# Patient Record
Sex: Male | Born: 1950
Health system: Southern US, Community
[De-identification: ages and names within clinical notes are randomized; demographics above are authoritative.]

## PROBLEM LIST (undated history)

## (undated) DIAGNOSIS — C44300 Unspecified malignant neoplasm of skin of unspecified part of face: Secondary | ICD-10-CM

## (undated) DIAGNOSIS — R000 Tachycardia, unspecified: Secondary | ICD-10-CM

## (undated) DIAGNOSIS — C801 Malignant (primary) neoplasm, unspecified: Secondary | ICD-10-CM

## (undated) DIAGNOSIS — R7611 Nonspecific reaction to tuberculin skin test without active tuberculosis: Secondary | ICD-10-CM

## (undated) DIAGNOSIS — Z85828 Personal history of other malignant neoplasm of skin: Secondary | ICD-10-CM

## (undated) DIAGNOSIS — A159 Respiratory tuberculosis unspecified: Secondary | ICD-10-CM

## (undated) DIAGNOSIS — M1990 Unspecified osteoarthritis, unspecified site: Secondary | ICD-10-CM

## (undated) DIAGNOSIS — N289 Disorder of kidney and ureter, unspecified: Secondary | ICD-10-CM

## (undated) DIAGNOSIS — R51 Headache: Secondary | ICD-10-CM

## (undated) DIAGNOSIS — E785 Hyperlipidemia, unspecified: Secondary | ICD-10-CM

## (undated) DIAGNOSIS — E039 Hypothyroidism, unspecified: Secondary | ICD-10-CM

## (undated) DIAGNOSIS — R011 Cardiac murmur, unspecified: Secondary | ICD-10-CM

## (undated) DIAGNOSIS — Z87442 Personal history of urinary calculi: Secondary | ICD-10-CM

## (undated) DIAGNOSIS — L02619 Cutaneous abscess of unspecified foot: Secondary | ICD-10-CM

## (undated) DIAGNOSIS — B0229 Other postherpetic nervous system involvement: Secondary | ICD-10-CM

## (undated) DIAGNOSIS — I10 Essential (primary) hypertension: Secondary | ICD-10-CM

## (undated) DIAGNOSIS — E079 Disorder of thyroid, unspecified: Secondary | ICD-10-CM

## (undated) DIAGNOSIS — N529 Male erectile dysfunction, unspecified: Secondary | ICD-10-CM

## (undated) DIAGNOSIS — I00 Rheumatic fever without heart involvement: Secondary | ICD-10-CM

## (undated) DIAGNOSIS — R1013 Epigastric pain: Secondary | ICD-10-CM

## (undated) DIAGNOSIS — S99929A Unspecified injury of unspecified foot, initial encounter: Secondary | ICD-10-CM

## (undated) DIAGNOSIS — N200 Calculus of kidney: Secondary | ICD-10-CM

## (undated) DIAGNOSIS — S99919A Unspecified injury of unspecified ankle, initial encounter: Secondary | ICD-10-CM

## (undated) DIAGNOSIS — R7989 Other specified abnormal findings of blood chemistry: Secondary | ICD-10-CM

## (undated) DIAGNOSIS — E291 Testicular hypofunction: Secondary | ICD-10-CM

## (undated) DIAGNOSIS — S8990XA Unspecified injury of unspecified lower leg, initial encounter: Secondary | ICD-10-CM

## (undated) DIAGNOSIS — K3189 Other diseases of stomach and duodenum: Secondary | ICD-10-CM

## (undated) DIAGNOSIS — J069 Acute upper respiratory infection, unspecified: Secondary | ICD-10-CM

## (undated) DIAGNOSIS — L03119 Cellulitis of unspecified part of limb: Secondary | ICD-10-CM

## (undated) DIAGNOSIS — K5900 Constipation, unspecified: Secondary | ICD-10-CM

## (undated) DIAGNOSIS — E8881 Metabolic syndrome: Secondary | ICD-10-CM

## (undated) HISTORY — DX: Disorder of thyroid, unspecified: E07.9

## (undated) HISTORY — DX: Cardiac murmur, unspecified: R01.1

## (undated) HISTORY — DX: Hyperlipidemia, unspecified: E78.5

## (undated) HISTORY — DX: Cellulitis of unspecified part of limb: L03.119

## (undated) HISTORY — DX: Male erectile dysfunction, unspecified: N52.9

## (undated) HISTORY — DX: Metabolic syndrome: E88.81

## (undated) HISTORY — DX: Other postherpetic nervous system involvement: B02.29

## (undated) HISTORY — PX: TONSILLECTOMY AND ADENOIDECTOMY: SHX28

## (undated) HISTORY — DX: Personal history of urinary calculi: Z87.442

## (undated) HISTORY — DX: Unspecified injury of unspecified foot, initial encounter: S99.929A

## (undated) HISTORY — DX: Rheumatic fever without heart involvement: I00

## (undated) HISTORY — DX: Malignant (primary) neoplasm, unspecified: C80.1

## (undated) HISTORY — DX: Unspecified osteoarthritis, unspecified site: M19.90

## (undated) HISTORY — DX: Other specified abnormal findings of blood chemistry: R79.89

## (undated) HISTORY — DX: Nonspecific reaction to tuberculin skin test without active tuberculosis: R76.11

## (undated) HISTORY — DX: Testicular hypofunction: E29.1

## (undated) HISTORY — DX: Epigastric pain: R10.13

## (undated) HISTORY — DX: Calculus of kidney: N20.0

## (undated) HISTORY — DX: Unspecified injury of unspecified lower leg, initial encounter: S89.90XA

## (undated) HISTORY — DX: Constipation, unspecified: K59.00

## (undated) HISTORY — DX: Other diseases of stomach and duodenum: K31.89

## (undated) HISTORY — DX: Unspecified injury of unspecified ankle, initial encounter: S99.919A

## (undated) HISTORY — PX: KNEE SURGERY: SHX244

## (undated) HISTORY — DX: Personal history of other malignant neoplasm of skin: Z85.828

## (undated) HISTORY — DX: Unspecified malignant neoplasm of skin of unspecified part of face: C44.300

## (undated) HISTORY — DX: Headache: R51

## (undated) HISTORY — DX: Disorder of kidney and ureter, unspecified: N28.9

## (undated) HISTORY — DX: Acute upper respiratory infection, unspecified: J06.9

## (undated) HISTORY — DX: Cutaneous abscess of unspecified foot: L02.619

## (undated) HISTORY — DX: Tachycardia, unspecified: R00.0

---

## 2005-10-29 ENCOUNTER — Emergency Department (HOSPITAL_COMMUNITY): Admission: EM | Admit: 2005-10-29 | Discharge: 2005-10-29 | Payer: Self-pay | Admitting: Emergency Medicine

## 2006-09-23 ENCOUNTER — Ambulatory Visit: Payer: Self-pay | Admitting: Cardiology

## 2006-09-23 ENCOUNTER — Observation Stay (HOSPITAL_COMMUNITY): Admission: EM | Admit: 2006-09-23 | Discharge: 2006-09-25 | Payer: Self-pay | Admitting: Emergency Medicine

## 2008-10-27 ENCOUNTER — Ambulatory Visit: Payer: Self-pay | Admitting: Internal Medicine

## 2008-10-27 DIAGNOSIS — N529 Male erectile dysfunction, unspecified: Secondary | ICD-10-CM

## 2008-10-27 DIAGNOSIS — Z85828 Personal history of other malignant neoplasm of skin: Secondary | ICD-10-CM

## 2008-10-27 DIAGNOSIS — E785 Hyperlipidemia, unspecified: Secondary | ICD-10-CM

## 2008-10-27 DIAGNOSIS — R1013 Epigastric pain: Secondary | ICD-10-CM

## 2008-10-27 DIAGNOSIS — N200 Calculus of kidney: Secondary | ICD-10-CM | POA: Insufficient documentation

## 2008-10-27 DIAGNOSIS — C44300 Unspecified malignant neoplasm of skin of unspecified part of face: Secondary | ICD-10-CM

## 2008-10-27 DIAGNOSIS — C44309 Unspecified malignant neoplasm of skin of other parts of face: Secondary | ICD-10-CM | POA: Insufficient documentation

## 2008-10-27 DIAGNOSIS — Z87442 Personal history of urinary calculi: Secondary | ICD-10-CM

## 2008-10-27 DIAGNOSIS — M1990 Unspecified osteoarthritis, unspecified site: Secondary | ICD-10-CM | POA: Insufficient documentation

## 2008-10-27 DIAGNOSIS — K3189 Other diseases of stomach and duodenum: Secondary | ICD-10-CM | POA: Insufficient documentation

## 2008-10-27 HISTORY — DX: Hyperlipidemia, unspecified: E78.5

## 2008-10-27 HISTORY — DX: Unspecified malignant neoplasm of skin of unspecified part of face: C44.300

## 2008-10-27 HISTORY — DX: Epigastric pain: R10.13

## 2008-10-27 HISTORY — DX: Male erectile dysfunction, unspecified: N52.9

## 2008-10-27 HISTORY — DX: Personal history of other malignant neoplasm of skin: Z85.828

## 2008-10-27 HISTORY — DX: Other diseases of stomach and duodenum: K31.89

## 2008-10-27 HISTORY — DX: Unspecified osteoarthritis, unspecified site: M19.90

## 2008-10-27 HISTORY — DX: Personal history of urinary calculi: Z87.442

## 2008-10-27 LAB — CONVERTED CEMR LAB
ALT: 24 units/L (ref 0–53)
AST: 23 units/L (ref 0–37)
Albumin: 4.2 g/dL (ref 3.5–5.2)
Alkaline Phosphatase: 88 units/L (ref 39–117)
Amylase: 66 units/L (ref 27–131)
BUN: 19 mg/dL (ref 6–23)
Basophils Relative: 0.3 % (ref 0.0–3.0)
Bilirubin, Direct: 0 mg/dL (ref 0.0–0.3)
CO2: 26 meq/L (ref 19–32)
Calcium: 9.4 mg/dL (ref 8.4–10.5)
Chloride: 107 meq/L (ref 96–112)
Cholesterol: 247 mg/dL — ABNORMAL HIGH (ref 0–200)
Creatinine, Ser: 1.4 mg/dL (ref 0.4–1.5)
Direct LDL: 160.6 mg/dL
Eosinophils Relative: 2.1 % (ref 0.0–5.0)
GFR calc non Af Amer: 55.3 mL/min (ref >60)
Glucose, Bld: 80 mg/dL (ref 70–99)
HCT: 46.7 % (ref 39.0–52.0)
HDL: 30.7 mg/dL — ABNORMAL LOW (ref >39.00)
Hemoglobin, Urine: NEGATIVE
Hemoglobin: 15.7 g/dL (ref 13.0–17.0)
Ketones, ur: NEGATIVE mg/dL
Leukocytes, UA: NEGATIVE
Lipase: 27 units/L (ref 11.0–59.0)
Lymphocytes Relative: 26.7 % (ref 12.0–46.0)
MCHC: 33.6 g/dL (ref 30.0–36.0)
MCV: 88.1 fL (ref 78.0–100.0)
Monocytes Relative: 3.1 % (ref 3.0–12.0)
Neutrophils Relative %: 67.8 % (ref 43.0–77.0)
Nitrite: NEGATIVE
PSA: 1.95 ng/mL (ref 0.10–4.00)
Platelets: 214 10*3/uL (ref 150.0–400.0)
Potassium: 4.2 meq/L (ref 3.5–5.1)
RBC: 5.3 M/uL (ref 4.22–5.81)
RDW: 13.4 % (ref 11.5–14.6)
Sodium: 144 meq/L (ref 135–145)
Specific Gravity, Urine: >=1.03 (ref 1.000–1.030)
TSH: 3.86 microintl units/mL (ref 0.35–5.50)
Testosterone: 98.81 ng/dL — ABNORMAL LOW (ref 350.00–890.00)
Total Bilirubin: 0.6 mg/dL (ref 0.3–1.2)
Total CHOL/HDL Ratio: 8
Total Protein: 7 g/dL (ref 6.0–8.3)
Triglycerides: 277 mg/dL — ABNORMAL HIGH (ref 0.0–149.0)
Urine Glucose: NEGATIVE mg/dL
Urobilinogen, UA: 1 (ref 0.0–1.0)
VLDL: 55.4 mg/dL — ABNORMAL HIGH (ref 0.0–40.0)
WBC: 7.8 10*3/uL (ref 4.5–10.5)
pH: 5.5 (ref 5.0–8.0)

## 2008-10-29 ENCOUNTER — Encounter: Payer: Self-pay | Admitting: Internal Medicine

## 2008-11-26 ENCOUNTER — Ambulatory Visit: Payer: Self-pay | Admitting: Internal Medicine

## 2008-11-26 DIAGNOSIS — E291 Testicular hypofunction: Secondary | ICD-10-CM

## 2008-11-26 HISTORY — DX: Testicular hypofunction: E29.1

## 2009-02-05 ENCOUNTER — Ambulatory Visit: Payer: Self-pay | Admitting: Internal Medicine

## 2009-02-05 DIAGNOSIS — E8881 Metabolic syndrome: Secondary | ICD-10-CM | POA: Insufficient documentation

## 2009-02-05 DIAGNOSIS — J069 Acute upper respiratory infection, unspecified: Secondary | ICD-10-CM

## 2009-02-05 HISTORY — DX: Acute upper respiratory infection, unspecified: J06.9

## 2009-02-05 HISTORY — DX: Metabolic syndrome: E88.81

## 2009-02-05 HISTORY — DX: Metabolic syndrome: E88.810

## 2009-02-05 LAB — CONVERTED CEMR LAB
Cholesterol, target level: 200 mg/dL
HDL goal, serum: 40 mg/dL
LDL Goal: 130 mg/dL

## 2009-09-22 ENCOUNTER — Ambulatory Visit: Payer: Self-pay | Admitting: Internal Medicine

## 2009-09-22 ENCOUNTER — Telehealth: Payer: Self-pay | Admitting: Internal Medicine

## 2009-09-22 DIAGNOSIS — S99919A Unspecified injury of unspecified ankle, initial encounter: Secondary | ICD-10-CM

## 2009-09-22 DIAGNOSIS — L03119 Cellulitis of unspecified part of limb: Secondary | ICD-10-CM

## 2009-09-22 DIAGNOSIS — S99929A Unspecified injury of unspecified foot, initial encounter: Secondary | ICD-10-CM

## 2009-09-22 DIAGNOSIS — L02619 Cutaneous abscess of unspecified foot: Secondary | ICD-10-CM

## 2009-09-22 DIAGNOSIS — S8990XA Unspecified injury of unspecified lower leg, initial encounter: Secondary | ICD-10-CM | POA: Insufficient documentation

## 2009-09-22 HISTORY — DX: Unspecified injury of unspecified lower leg, initial encounter: S89.90XA

## 2009-09-22 HISTORY — DX: Cellulitis of unspecified part of limb: L02.619

## 2009-12-14 ENCOUNTER — Encounter (INDEPENDENT_AMBULATORY_CARE_PROVIDER_SITE_OTHER): Payer: Self-pay | Admitting: Emergency Medicine

## 2009-12-14 ENCOUNTER — Emergency Department (HOSPITAL_COMMUNITY): Admission: EM | Admit: 2009-12-14 | Discharge: 2009-12-14 | Payer: Self-pay | Admitting: Emergency Medicine

## 2009-12-14 ENCOUNTER — Ambulatory Visit: Payer: Self-pay | Admitting: Internal Medicine

## 2010-02-16 NOTE — Assessment & Plan Note (Signed)
Summary: ankle injury/SD   Vital Signs:  Patient profile:   60 year old male Height:      72 inches Weight:      230 pounds BMI:     31.31 O2 Sat:      96 % on Room air Temp:     97.3 degrees F oral Pulse rate:   76 / minute Pulse rhythm:   regular Resp:     16 per minute BP sitting:   124 / 86  (left arm) Cuff size:   large  Vitals Entered By: Rock Nephew CMA (September 22, 2009 10:27 AM)  O2 Flow:  Room air   Primary Care Provider:  Etta Grandchild MD   History of Present Illness: He returns with the complaint that he hit the right ankle 3 days ago with a pick ax and has a PW. The area bled at first but now it feels sore and mildly swollen.  Preventive Screening-Counseling & Management  Alcohol-Tobacco     Alcohol drinks/day: <1     Alcohol type: beer     >5/day in last 3 mos: no     Alcohol Counseling: not indicated; use of alcohol is not excessive or problematic     Feels need to cut down: no     Feels annoyed by complaints: no     Feels guilty re: drinking: no     Needs 'eye opener' in am: no     Smoking Status: never     Tobacco Counseling: not indicated; no tobacco use  Hep-HIV-STD-Contraception     Hepatitis Risk: no risk noted     HIV Risk: no risk noted     STD Risk: no risk noted      Drug Use:  no.    Clinical Review Panels:  Immunizations   Last Tetanus Booster:  Historical (01/17/2001)   Medications Prior to Update: 1)  Crestor 20 Mg Tabs (Rosuvastatin Calcium) .... Once Daily For Cholesterol 2)  Androgel 50 Mg/5gm Gel (Testosterone) .... Apply Two  Once Daily 3)  Viagra 50 Mg Tabs (Sildenafil Citrate) .Marland Kitchen.. 1-2 As Directed  Current Medications (verified): 1)  Crestor 20 Mg Tabs (Rosuvastatin Calcium) .... Once Daily For Cholesterol 2)  Androgel 50 Mg/5gm Gel (Testosterone) .... Apply Two  Once Daily 3)  Viagra 50 Mg Tabs (Sildenafil Citrate) .Marland Kitchen.. 1-2 As Directed 4)  Cipro 500 Mg Tab (Ciprofloxacin Hcl) .... Take 1 Tablet By Mouth  Morning and Night X 10 Days  Allergies (verified): 1)  ! 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 Family History of Alcoholism/Addiction Family History of Arthritis Family History Diabetes 1st degree relative  Social History: Reviewed history from 10/27/2008 and no changes required. 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   General:  Denies chills, fatigue, fever, loss of appetite, malaise, sleep disorder, and sweats. MS:  Complains of joint pain, joint redness, joint swelling, and stiffness; denies loss of strength, low back pain, muscle aches, and cramps.  Physical Exam  General:  alert, well-developed, well-nourished, well-hydrated, normal appearance, healthy-appearing, cooperative to examination, good hygiene, and overweight-appearing.   Mouth:  no exudates, no posterior lymphoid hypertrophy, no pharyngeal crowing, no lesions, no erosions, no leukoplakia, no petechiae, and pharyngeal erythema.     Neck:  supple, full ROM, no masses, no thyromegaly, no thyroid nodules or tenderness, no JVD, no cervical lymphadenopathy, and no neck tenderness.   Lungs:  normal respiratory effort, no intercostal retractions, no accessory muscle use, normal breath sounds, no dullness, no fremitus, no crackles, and no wheezes.   Heart:  normal rate, regular rhythm, no murmur, no gallop, no rub, and no JVD.   Abdomen:  soft, non-tender, normal bowel sounds, no distention, no masses, no guarding, no rigidity, no hepatomegaly, and no splenomegaly.   Msk:  over the right ankle there is a small healed PW just above the medial malleolus that produces no exudate when pressed, there is a mild surrounding area of erythema and warmth. no FB or boney derangement is seen. there is FROM and no joint laxity and he has normal flexion and extension in the foot. Pulses:  R femoral normal, R popliteal normal, R posterior tibial normal, and R dorsalis pedis normal.   Extremities:  no edema Neurologic:  No cranial nerve deficits noted. Station and gait are normal. Plantar reflexes are down-going bilaterally. DTRs are symmetrical throughout. Sensory, motor and coordinative functions appear intact. Cervical Nodes:  no anterior cervical adenopathy and no posterior cervical adenopathy.   Inguinal Nodes:  no R inguinal adenopathy and no L inguinal adenopathy.   Psych:  Cognition and judgment appear intact. Alert and cooperative with normal attention span and concentration. No apparent delusions, illusions, hallucinations   Impression & Recommendations:  Problem # 1:  ANKLE INJURY, RIGHT (ICD-959.7) the xray is normal, there is no fracture or FB, this is a PW with mild early cellulitis- he did not want a tetanus booster today  Problem # 2:  CELLULITIS, FOOT (ICD-682.7) Assessment: New  His updated medication list for this problem includes:    Cipro 500 Mg Tab (Ciprofloxacin hcl) .Marland Kitchen... Take 1 tablet by mouth morning and night x 10  days  Complete Medication List: 1)  Crestor 20 Mg Tabs (Rosuvastatin calcium) .... Once daily for cholesterol 2)  Androgel 50 Mg/5gm Gel (Testosterone) .... Apply two  once daily 3)  Viagra 50 Mg Tabs (Sildenafil citrate) .Marland Kitchen.. 1-2 as directed 4)  Cipro 500 Mg Tab (Ciprofloxacin hcl) .... Take 1 tablet by mouth morning and night x 10 days  Patient Instructions: 1)  Please schedule a follow-up appointment in 2 weeks. 2)  Take your antibiotic as prescribed until ALL of it is gone, but stop if you develop a rash or swelling and contact our office as soon as possible. Elevate the right foot as much as possible. Prescriptions: CIPRO 500 MG TAB (CIPROFLOXACIN HCL) Take 1 tablet by mouth morning and night X 10 days  #20 x 0   Entered and Authorized by:   Etta Grandchild MD   Signed by:   Etta Grandchild MD on 09/22/2009   Method used:   Electronically to        [facility] [phone]* (retail)  [address]       [place], [place]  [address]       Ph: [phone] or [phone]       Fax: [phone]   RxID:   [id number] ANDROGEL 50 MG/5GM GEL (TESTOSTERONE) Apply two  once daily  #60 x 5   Entered and Authorized by:   Etta Grandchild MD   Signed by:   Etta Grandchild MD on 09/22/2009   Method used:   Print then Give to Patient   RxID:   [id number]   Prevention & Chronic Care Immunizations   Influenza vaccine: Not documented    Tetanus booster: 01/17/2001: Historical   Td booster deferral: Refused  (09/22/2009)    Pneumococcal vaccine: Not documented  Colorectal Screening   Hemoccult: Not documented    Colonoscopy: Not documented  Other Screening   PSA: 1.95  (10/27/2008)   Smoking status: never  (09/22/2009)  Lipids   Total Cholesterol: 247  (10/27/2008)   LDL: Not documented   LDL Direct: 160.6  (10/27/2008)   HDL: 30.70  (10/27/2008)   Triglycerides: 277.0  (10/27/2008)    SGOT (AST): 23  (10/27/2008)   SGPT (ALT): 24   (10/27/2008)   Alkaline phosphatase: 88  (10/27/2008)   Total bilirubin: 0.6  (10/27/2008)  Self-Management Support :    Lipid self-management support: Not documented

## 2010-02-16 NOTE — Letter (Signed)
Summary: Lipid Letter  [facility]  [address] [place], [place] [address]   Phone: [phone]  Fax: [phone]    10/29/2008  Dale Wood [address] [place], [place]  [address]  Dear Dale Wood:  We have carefully reviewed your last lipid profile from  and the results are noted below with a summary of recommendations for lipid management.    Cholesterol:       247     Goal: <200   HDL "good" Cholesterol:   28.41     Goal: >40   LDL "bad" Cholesterol:   161     Goal: <130   Triglycerides:       277.0     Goal: <150    these numbers are not good.    TLC Diet (Therapeutic Lifestyle Change): Saturated Fats & Transfatty acids should be kept < 7% of total calories ***Reduce Saturated Fats Polyunstaurated Fat can be up to 10% of total calories Monounsaturated Fat Fat can be up to 20% of total calories Total Fat should be no greater than 25-35% of total calories Carbohydrates should be 50-60% of total calories Protein should be approximately 15% of total calories Fiber should be at least 20-30 grams a day ***Increased fiber may help lower LDL Total Cholesterol should be < 200mg /day Consider adding plant stanol/sterols to diet (example: Benacol spread) ***A higher intake of unsaturated fat may reduce Triglycerides and Increase HDL    Adjunctive Measures (may lower LIPIDS and reduce risk of Heart Attack) include: Aerobic Exercise (20-30 minutes 3-4 times a week) Limit Alcohol Consumption Weight Reduction Aspirin 75-81 mg a day by mouth (if not allergic or contraindicated) Dietary Fiber 20-30 grams a day by mouth     Current Medications:  None If you have any questions, please call. We appreciate being able to work with you.   Sincerely,    [facility] Dale Grandchild MD

## 2010-02-16 NOTE — Progress Notes (Signed)
Summary: XRAY & OV  Phone Note Call from Patient   Summary of Call: Patient hit right ankle this weekend with a pickaxe. He cleaned wound and bandaged. Pt c/o swelling and pain. He is concerned about possible fracture. Scheduled for xray and then office visit this am.  Initial call taken by: Lamar Sprinkles, CMA,  September 22, 2009 9:19 AM  New Problems: ANKLE INJURY, RIGHT (ICD-959.7)   New Problems: ANKLE INJURY, RIGHT (ICD-959.7)

## 2010-02-16 NOTE — Assessment & Plan Note (Signed)
Summary: 2 mos f/u / #/ cd   Vital Signs:  Patient profile:   60 year old male Height:      72 inches Weight:      232 pounds BMI:     31.58 O2 Sat:      96 % on Room air Temp:     98.4 degrees F oral Pulse rate:   90 / minute Pulse rhythm:   regular Resp:     16 per minute BP sitting:   144 / 80  (left arm) Cuff size:   large  Vitals Entered By: Rock Nephew CMA (February 05, 2009 2:32 PM)  Nutrition Counseling: Patient's BMI is greater than 25 and therefore counseled on weight management options.  O2 Flow:  Room air CC: follow-up visit//cough, Abdominal Pain, URI symptoms, Lipid Management Is Patient Diabetic? No   Primary Care Provider:  Etta Grandchild MD  CC:  follow-up visit//cough, Abdominal Pain, URI symptoms, and Lipid Management.  History of Present Illness: He reports that he has not had any significant response to Androgel once a day as he still has a low libido and ED.  URI Symptoms      This is a 60 year old man who presents with URI symptoms.  The symptoms began 1 day ago.  The severity is described as mild.  The patient reports nasal congestion, clear nasal discharge, sore throat, and dry cough, but denies productive cough, earache, and sick contacts.  The patient denies fever, stiff neck, dyspnea, wheezing, rash, vomiting, diarrhea, and use of an antipyretic.  The patient denies headache, muscle aches, and severe fatigue.  The patient denies the following risk factors for Strep sinusitis: unilateral facial pain, unilateral nasal discharge, double sickening, Strep exposure, tender adenopathy, and absence of cough.    Dyspepsia History:      He has no alarm features of dyspepsia including no history of melena, hematochezia, dysphagia, persistent vomiting, or involuntary weight loss > 5%.    Lipid Management History:      Positive NCEP/ATP III risk factors include male age 60 years old or older and HDL cholesterol less than 40.  Negative NCEP/ATP III risk  factors include non-diabetic, no family history for ischemic heart disease, non-tobacco-user status, non-hypertensive, no ASHD (atherosclerotic heart disease), no prior stroke/TIA, no peripheral vascular disease, and no history of aortic aneurysm.        The patient states that he knows about the "Therapeutic Lifestyle Change" diet.  His compliance with the TLC diet is fair.  The patient expresses understanding of adjunctive measures for cholesterol lowering.  Adjunctive measures started by the patient include aerobic exercise, fiber, limit alcohol consumpton, and weight reduction.  He expresses no side effects from his lipid-lowering medication.  The patient denies any symptoms to suggest myopathy or liver disease.      Preventive Screening-Counseling & Management  Alcohol-Tobacco     Alcohol drinks/day: <1     Alcohol type: beer     >5/day in last 3 mos: no     Alcohol Counseling: not indicated; use of alcohol is not excessive or problematic     Feels need to cut down: no     Feels annoyed by complaints: no     Feels guilty re: drinking: no     Needs 'eye opener' in am: no     Smoking Status: never  Clinical Review Panels:  Diabetes Management   Creatinine:  1.4 (10/27/2008)  CBC   WBC:  7.8 *R  K/uL (10/27/2008)   RBC:  5.30 (10/27/2008)   Hgb:  15.7 (10/27/2008)   Hct:  46.7 (10/27/2008)   Platelets:  214.0 (10/27/2008)   MCV  88.1 (10/27/2008)   MCHC  33.6 (10/27/2008)   RDW  13.4 (10/27/2008)   PMN:  67.8 (10/27/2008)   Lymphs:  26.7 (10/27/2008)   Monos:  3.1 (10/27/2008)   Eosinophils:  2.1 (10/27/2008)   Basophil:  0.3 (10/27/2008)  Complete Metabolic Panel   Glucose:  80 (10/27/2008)   Sodium:  144 (10/27/2008)   Potassium:  4.2 (10/27/2008)   Chloride:  107 (10/27/2008)   CO2:  26 (10/27/2008)   BUN:  19 (10/27/2008)   Creatinine:  1.4 (10/27/2008)   Albumin:  4.2 (10/27/2008)   Total Protein:  7.0 (10/27/2008)   Calcium:  9.4 (10/27/2008)   Total Bili:  0.6  (10/27/2008)   Alk Phos:  88 (10/27/2008)   SGPT (ALT):  24 (10/27/2008)   SGOT (AST):  23 (10/27/2008)   Medications Prior to Update: 1)  Crestor 20 Mg Tabs (Rosuvastatin Calcium) .... Once Daily For Cholesterol 2)  Androgel 50 Mg/5gm Gel (Testosterone) .... Apply One Once Daily  Current Medications (verified): 1)  Crestor 20 Mg Tabs (Rosuvastatin Calcium) .... Once Daily For Cholesterol 2)  Androgel 50 Mg/5gm Gel (Testosterone) .... Apply Two  Once Daily 3)  Viagra 50 Mg Tabs (Sildenafil Citrate) .Marland Kitchen.. 1-2 As Directed  Allergies (verified): 1)  ! Penicillin  Past History:  Past Medical History: Reviewed history from 10/27/2008 and no changes required. Skin cancer, hx of Hyperlipidemia Nephrolithiasis, hx of Osteoarthritis Normal cardiolite 2008 at [facility] Bell's palsy  Past Surgical History: Reviewed history from 10/27/2008 and no changes required. Tonsillectomy  Family History: Reviewed history from 10/27/2008 and no changes required. Family History of Alcoholism/Addiction Family History of Arthritis Family History Diabetes 1st degree relative  Social History: Reviewed history from 10/27/2008 and no changes required. 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   GU:  Complains of decreased libido and erectile dysfunction; denies dysuria, hematuria, incontinence, nocturia, urinary frequency, and urinary hesitancy.  Physical Exam  General:  alert, well-developed, well-nourished, well-hydrated, normal appearance, healthy-appearing, cooperative to examination, good hygiene, and overweight-appearing.   Eyes:  No icterus Ears:  R ear normal and L ear normal.   Nose:  no nasal discharge, no airflow obstruction, no intranasal foreign body, no nasal polyps, no nasal mucosal lesions, no mucosal friability, no active bleeding or clots, no sinus percussion  tenderness, no septum abnormalities, mucosal erythema, and mucosal edema.   Mouth:  no exudates, no posterior lymphoid hypertrophy, no pharyngeal crowing, no lesions, no erosions, no leukoplakia, no petechiae, and pharyngeal erythema.   Neck:  supple, full ROM, no masses, no thyromegaly, no thyroid nodules or tenderness, no JVD, no cervical lymphadenopathy, and no neck tenderness.   Lungs:  normal respiratory effort, no intercostal retractions, no accessory muscle use, normal breath sounds, no dullness, no fremitus, no crackles, and no wheezes.   Heart:  normal rate, regular rhythm, no murmur, no gallop, no rub, and no JVD.   Abdomen:  soft, non-tender, normal bowel sounds, no distention, no masses, no guarding, no rigidity, no hepatomegaly, and no splenomegaly.     Impression & Recommendations:  Problem # 1:  URI (ICD-465.9) Assessment New this sounds viral so no anitbiotics were given  Problem # 2:  HYPOGONADISM (ICD-257.2) will increase androgel to two  a day   Problem # 3:  HYPERLIPIDEMIA (ICD-272.4) Assessment: Improved  His updated medication list for this problem includes:    Crestor 20 Mg Tabs (Rosuvastatin calcium) ..... Once daily for cholesterol  Labs Reviewed: SGOT: 23 (10/27/2008)   SGPT: 24 (10/27/2008)   HDL:30.70 (10/27/2008)  Chol:247 (10/27/2008)  Trig:277.0 (10/27/2008)  Problem # 4:  DYSMETABOLIC SYNDROME (ICD-277.7) Assessment: New  Complete Medication List: 1)  Crestor 20 Mg Tabs (Rosuvastatin calcium) .... Once daily for cholesterol 2)  Androgel 50 Mg/5gm Gel (Testosterone) .... Apply two  once daily 3)  Viagra 50 Mg Tabs (Sildenafil citrate) .Marland Kitchen.. 1-2 as directed  Lipid Assessment/Plan:      Based on NCEP/ATP III, the patient's risk factor category is "2 or more risk factors and a calculated 10 year CAD risk of > 20%".  The patient's lipid goals are as follows: Total cholesterol goal is 200; LDL cholesterol goal is 130; HDL cholesterol goal is 40;  Triglyceride goal is 150.    Patient Instructions: 1)  Please schedule a follow-up appointment in 2 months. 2)  It is important that you exercise regularly at least 20 minutes 5 times a week. If you develop chest pain, have severe difficulty breathing, or feel very tired , stop exercising immediately and seek medical attention. 3)  You need to lose weight. Consider a lower calorie diet and regular exercise.  Prescriptions: ANDROGEL 50 MG/5GM GEL (TESTOSTERONE) Apply two  once daily  #60 x 5   Entered and Authorized by:   Etta Grandchild MD   Signed by:   Etta Grandchild MD on 02/05/2009   Method used:   Historical   RxID:   [id number] VIAGRA 50 MG TABS (SILDENAFIL CITRATE) 1-2 as directed  #12 x 0   Entered and Authorized by:   Etta Grandchild MD   Signed by:   Etta Grandchild MD on 02/05/2009   Method used:   Samples Given   RxID:   [id number]

## 2010-02-16 NOTE — Assessment & Plan Note (Signed)
Summary: FU/NWS   Vital Signs:  Patient profile:   61 year old male Height:      72 inches Weight:      231 pounds BMI:     31.44 O2 Sat:      95 % on Room air Temp:     98.2 degrees F oral Pulse rate:   89 / minute Pulse rhythm:   regular Resp:     16 per minute BP sitting:   134 / 86  (left arm) Cuff size:   large  Vitals Entered By: Rock Nephew CMA (November 26, 2008 3:02 PM)  Nutrition Counseling: Patient's BMI is greater than 25 and therefore counseled on weight management options.  O2 Flow:  Room air  Primary Care Provider:  Etta Grandchild MD   History of Present Illness: He returns regarding recent labs that showed abnormal lipids and low T.  Preventive Screening-Counseling & Management  Alcohol-Tobacco     Alcohol drinks/day: <1     Alcohol type: beer     >5/day in last 3 mos: no     Alcohol Counseling: not indicated; use of alcohol is not excessive or problematic     Feels need to cut down: no     Feels annoyed by complaints: no     Feels guilty re: drinking: no     Needs 'eye opener' in am: no     Smoking Status: never  Clinical Review Panels:  Prevention   Last PSA:  1.95 (10/27/2008)  Lipid Management   Cholesterol:  247 (10/27/2008)   HDL (good cholesterol):  30.70 (10/27/2008)  Diabetes Management   Creatinine:  1.4 (10/27/2008)  CBC   WBC:  7.8 *R K/uL (10/27/2008)   RBC:  5.30 (10/27/2008)   Hgb:  15.7 (10/27/2008)   Hct:  46.7 (10/27/2008)   Platelets:  214.0 (10/27/2008)   MCV  88.1 (10/27/2008)   MCHC  33.6 (10/27/2008)   RDW  13.4 (10/27/2008)   PMN:  67.8 (10/27/2008)   Lymphs:  26.7 (10/27/2008)   Monos:  3.1 (10/27/2008)   Eosinophils:  2.1 (10/27/2008)   Basophil:  0.3 (10/27/2008)  Complete Metabolic Panel   Glucose:  80 (10/27/2008)   Sodium:  144 (10/27/2008)   Potassium:  4.2 (10/27/2008)   Chloride:  107 (10/27/2008)   CO2:  26 (10/27/2008)   BUN:  19 (10/27/2008)   Creatinine:  1.4 (10/27/2008)   Albumin:   4.2 (10/27/2008)   Total Protein:  7.0 (10/27/2008)   Calcium:  9.4 (10/27/2008)   Total Bili:  0.6 (10/27/2008)   Alk Phos:  88 (10/27/2008)   SGPT (ALT):  24 (10/27/2008)   SGOT (AST):  23 (10/27/2008)   Allergies (verified): 1)  ! Penicillin  Past History:  Past Medical History: Reviewed history from 10/27/2008 and no changes required. Skin cancer, hx of Hyperlipidemia Nephrolithiasis, hx of Osteoarthritis Normal cardiolite 2008 at [facility] Bell's palsy  Past Surgical History: Reviewed history from 10/27/2008 and no changes required. Tonsillectomy  Family History: Reviewed history from 10/27/2008 and no changes required. Family History of Alcoholism/Addiction Family History of Arthritis Family History Diabetes 1st degree relative  Social History: Reviewed history from 10/27/2008 and no changes required. 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  Physical Exam  General:  alert, well-developed, well-nourished, well-hydrated, normal appearance, healthy-appearing, cooperative  to examination, good hygiene, and overweight-appearing.   Neck:  supple, full ROM, no masses, no cervical lymphadenopathy, and no neck tenderness.   Lungs:  Normal respiratory effort, chest expands symmetrically. Lungs are clear to auscultation, no crackles or wheezes. Heart:  Normal rate and regular rhythm. S1 and S2 normal without gallop, murmur, click, rub or other extra sounds. Abdomen:  soft, non-tender, normal bowel sounds, no distention, no masses, no guarding, no rigidity, no rebound tenderness, no abdominal hernia, no inguinal hernia, no hepatomegaly, and no splenomegaly.  he has lipomas scattered across his upper abdomen. Prostate:  no nodules, no  asymmetry, no induration, and 1+ enlarged.   Msk:  normal ROM, no joint tenderness, and no joint swelling.   Extremities:  No clubbing, cyanosis, edema, or deformity noted with normal full range of motion of all joints.   Psych:  Cognition and judgment appear intact. Alert and cooperative with normal attention span and concentration. No apparent delusions, illusions, hallucinations   Impression & Recommendations:  Problem # 1:  HYPOGONADISM (ICD-257.2) start androgel  Problem # 2:  HYPERLIPIDEMIA (ICD-272.4) Assessment: Deteriorated  His updated medication list for this problem includes:    Crestor 20 Mg Tabs (Rosuvastatin calcium) ..... Once daily for cholesterol  Labs Reviewed: SGOT: 23 (10/27/2008)   SGPT: 24 (10/27/2008)   HDL:30.70 (10/27/2008)  Chol:247 (10/27/2008)  Trig:277.0 (10/27/2008)  Orders: Nutrition Referral (Nutrition)  Complete Medication List: 1)  Crestor 20 Mg Tabs (Rosuvastatin calcium) .... Once daily for cholesterol 2)  Androgel 50 Mg/5gm Gel (Testosterone) .... Apply one once daily  Patient Instructions: 1)  Please schedule a follow-up appointment in 2 months. 2)  It is important that you exercise regularly at least 20 minutes 5 times a week. If you develop chest pain, have severe difficulty breathing, or feel very tired , stop exercising immediately and seek medical attention. 3)  You need to lose weight. Consider a lower calorie diet and regular exercise.  Prescriptions: ANDROGEL 50 MG/5GM GEL (TESTOSTERONE) Apply one once daily  #30 x 5   Entered and Authorized by:   Etta Grandchild MD   Signed by:   Etta Grandchild MD on 11/26/2008   Method used:   Print then Give to Patient   RxID:   [id number] CRESTOR 20 MG TABS (ROSUVASTATIN CALCIUM) once daily for cholesterol  #84 x 0   Entered and Authorized by:   Etta Grandchild MD   Signed by:   Etta Grandchild MD on 11/26/2008   Method used:   Samples Given   RxID:   [id number]

## 2010-02-16 NOTE — Assessment & Plan Note (Signed)
Summary: NEW [employer] PT--PKG/OFF--#--STC   Vital Signs:  Patient profile:   60 year old male Height:      72 inches Weight:      225 pounds BMI:     30.63 O2 Sat:      95 % on Room air Temp:     98.4 degrees F oral Pulse rate:   79 / minute Pulse rhythm:   regular Resp:     16 per minute BP sitting:   128 / 80  (left arm) Cuff size:   large  Vitals Entered By: Rock Nephew CMA (October 27, 2008 3:29 PM)  Nutrition Counseling: Patient's BMI is greater than 25 and therefore counseled on weight management options.  O2 Flow:  Room air CC: new to establish   Primary Care Provider:  Etta Grandchild MD  CC:  new to establish.  History of Present Illness: New to me c/o pain around bilateral sides of upper abdomen for 3  months, he's worried about his liver b/c his mother recently died at 41 yoa from liver cancer.  Preventive Screening-Counseling & Management  Alcohol-Tobacco     Alcohol drinks/day: <1     Alcohol type: beer     >5/day in last 3 mos: no     Alcohol Counseling: not indicated; use of alcohol is not excessive or problematic     Feels need to cut down: no     Feels annoyed by complaints: no     Feels guilty re: drinking: no     Needs 'eye opener' in am: no     Smoking Status: never  Caffeine-Diet-Exercise     Does Patient Exercise: no  Hep-HIV-STD-Contraception     Hepatitis Risk: no risk noted     HIV Risk: no risk noted     STD Risk: no risk noted      Drug Use:  no.    Current Medications (verified): 1)  None  Allergies (verified): 1)  ! 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  The patient denies anorexia, fever, weight loss, chest pain, syncope, dyspnea on exertion, peripheral edema, prolonged cough, headaches, hemoptysis, melena, hematochezia, severe indigestion/heartburn, hematuria, incontinence, genital sores, muscle weakness, depression, unusual weight change, abnormal bleeding, enlarged lymph nodes, angioedema, and testicular masses.   GI:  Complains of abdominal pain, indigestion, and nausea; denies bloody stools, change in bowel habits, constipation, dark tarry stools, diarrhea, excessive appetite, gas, loss of appetite, vomiting, vomiting blood, and yellowish skin color. GU:  Complains of decreased libido and erectile dysfunction; denies discharge, dysuria, genital sores, hematuria, incontinence, nocturia, urinary frequency, and urinary hesitancy. Derm:  Complains of lesion(s); denies changes in color of skin, changes in nail beds, dryness, flushing, hair loss, insect bite(s), itching, poor wound healing, and rash.  Physical Exam  General:  alert, well-developed, well-nourished, well-hydrated, normal appearance, healthy-appearing, cooperative to examination,  good hygiene, and overweight-appearing.   Head:  normocephalic and atraumatic.   Eyes:  No icterus Mouth:  Oral mucosa and oropharynx without lesions or exudates.  Teeth in good repair. Neck:  supple, full ROM, no masses, no cervical lymphadenopathy, and no neck tenderness.   Chest Wall:  No deformities, masses, tenderness or gynecomastia noted. Breasts:  No masses or gynecomastia noted Lungs:  Normal respiratory effort, chest expands symmetrically. Lungs are clear to auscultation, no crackles or  wheezes. Heart:  Normal rate and regular rhythm. S1 and S2 normal without gallop, murmur, click, rub or other extra sounds. Abdomen:  soft, non-tender, normal bowel sounds, no distention, no masses, no guarding, no rigidity, no rebound tenderness, no abdominal hernia, no inguinal hernia, no hepatomegaly, and no splenomegaly.  he has lipomas scattered across his upper abdomen. Rectal:  No external abnormalities noted. Normal sphincter tone. No rectal masses or tenderness. heme negatvie stool. Genitalia:  circumcised, no hydrocele, no varicocele, no scrotal masses, no cutaneous lesions, no urethral discharge, R testes atrophic, and L testes atrophic.   Prostate:  no nodules, no asymmetry, no induration, and 1+ enlarged.   Skin:  there is a 3mm pearly lesion on left malar surface, which appears tpo be BCC.  Cervical Nodes:  No lymphadenopathy noted Axillary Nodes:  No palpable lymphadenopathy Inguinal Nodes:  No significant adenopathy Psych:  Cognition and judgment appear intact. Alert and cooperative with normal attention span and concentration. No apparent delusions, illusions, hallucinations   Impression & Recommendations:  Problem # 1:  BASAL CELL CARCINOMA, FACE (ICD-173.3) Assessment New  Orders: Dermatology Referral (Derma)  Problem # 2:  HYPERLIPIDEMIA (ICD-272.4) Assessment: Unchanged  Problem # 3:  DYSPEPSIA (ICD-536.8) Assessment: New he will start prilosec otc and lifestyle modifications Orders: Venipuncture (87564) TLB-BMP (Basic Metabolic Panel-BMET) (80048-METABOL) TLB-CBC Platelet - w/Differential (85025-CBCD) TLB-Hepatic/Liver Function Pnl (80076-HEPATIC) TLB-TSH (Thyroid Stimulating Hormone) (84443-TSH) TLB-Lipid Panel (80061-LIPID) TLB-Lipase (83690-LIPASE) TLB-Amylase (82150-AMYL) TLB-PSA (Prostate Specific Antigen) (84153-PSA) TLB-Testosterone, Total (84403-TESTO) TLB-Udip w/ Micro (81001-URINE) Hemoccult Guaiac-1 spec.(in office) (82270)  Problem # 4:   ABDOMINAL PAIN, EPIGASTRIC (ICD-789.06) Assessment: New etiology is unclear to me so will begin a diagmositc work-up Orders: Venipuncture (33295) TLB-BMP (Basic Metabolic Panel-BMET) (80048-METABOL) TLB-CBC Platelet - w/Differential (85025-CBCD) TLB-Hepatic/Liver Function Pnl (80076-HEPATIC) TLB-TSH (Thyroid Stimulating Hormone) (84443-TSH) TLB-Lipid Panel (80061-LIPID) TLB-Lipase (83690-LIPASE) TLB-Amylase (82150-AMYL) TLB-PSA (Prostate Specific Antigen) (84153-PSA) TLB-Testosterone, Total (84403-TESTO) TLB-Udip w/ Micro (81001-URINE) Hemoccult Guaiac-1 spec.(in office) (82270)  Problem # 5:  ERECTILE DYSFUNCTION, ORGANIC (ICD-607.84) he reports a lack of response to viagra so will test for hormonal deficiencies. Orders: Venipuncture (18841) TLB-BMP (Basic Metabolic Panel-BMET) (80048-METABOL) TLB-CBC Platelet - w/Differential (85025-CBCD) TLB-Hepatic/Liver Function Pnl (80076-HEPATIC) TLB-TSH (Thyroid Stimulating Hormone) (84443-TSH) TLB-Lipid Panel (80061-LIPID) TLB-Lipase (83690-LIPASE) TLB-Amylase (82150-AMYL) TLB-PSA (Prostate Specific Antigen) (84153-PSA) TLB-Testosterone, Total (84403-TESTO) TLB-Udip w/ Micro (81001-URINE)  Patient Instructions: 1)  Please schedule a follow-up appointment in 1 month. 2)  Avoid foods high in acid (tomatoes, citrus juices, spicy foods). Avoid eating within two hours of lying down or before exercising. Do not over eat; try smaller more frequent meals. Elevate head of bed twelve inches when sleeping. 3)  It is important that you exercise regularly at least 20 minutes 5 times a week. If you develop chest pain, have severe difficulty breathing, or feel very tired , stop exercising immediately and seek medical attention. 4)  You need to lose weight. Consider a lower calorie diet and regular exercise.   Preventive Care Screening  Last Tetanus Booster:    Date:  01/17/2001    Results:  Historical

## 2010-02-16 NOTE — Letter (Signed)
Summary: Results Follow-up Letter  [facility]  [address] [place], [place] [address]   Phone: [phone]  Fax: [phone]    10/29/2008  [address] [place], [place]  [address]  Dear Mr. Ridgeway,   The following are the results of your recent test(s):  Test     Result     Testosterone   low CBC       normal Liver/kidney   normal Urine       normal Thyroid     normal   _________________________________________________________  Please call for an appointment in 2-3 weeks _________________________________________________________ _________________________________________________________ _________________________________________________________  Sincerely,  Sanda Linger MD [facility]

## 2010-03-30 LAB — POCT CARDIAC MARKERS
CKMB, poc: 1.1 ng/mL (ref 1.0–8.0)
CKMB, poc: <1 ng/mL — ABNORMAL LOW (ref 1.0–8.0)
CKMB, poc: <1 ng/mL — ABNORMAL LOW (ref 1.0–8.0)
Myoglobin, poc: 51.8 ng/mL (ref 12–200)
Myoglobin, poc: 52.8 ng/mL (ref 12–200)
Myoglobin, poc: 84 ng/mL (ref 12–200)
Troponin i, poc: <0.05 ng/mL (ref 0.00–0.09)
Troponin i, poc: <0.05 ng/mL (ref 0.00–0.09)
Troponin i, poc: <0.05 ng/mL (ref 0.00–0.09)

## 2010-03-30 LAB — DIFFERENTIAL
Basophils Absolute: 0.1 10*3/uL (ref 0.0–0.1)
Basophils Relative: 1 % (ref 0–1)
Eosinophils Absolute: 0.1 10*3/uL (ref 0.0–0.7)
Eosinophils Relative: 1 % (ref 0–5)
Lymphocytes Relative: 10 % — ABNORMAL LOW (ref 12–46)
Lymphs Abs: 1.3 10*3/uL (ref 0.7–4.0)
Monocytes Absolute: 1 10*3/uL (ref 0.1–1.0)
Monocytes Relative: 8 % (ref 3–12)
Neutro Abs: 11 10*3/uL — ABNORMAL HIGH (ref 1.7–7.7)
Neutrophils Relative %: 82 % — ABNORMAL HIGH (ref 43–77)

## 2010-03-30 LAB — URINE MICROSCOPIC-ADD ON

## 2010-03-30 LAB — URINALYSIS, ROUTINE W REFLEX MICROSCOPIC
Bilirubin Urine: NEGATIVE
Glucose, UA: NEGATIVE mg/dL
Hgb urine dipstick: NEGATIVE
Ketones, ur: NEGATIVE mg/dL
Nitrite: NEGATIVE
Protein, ur: NEGATIVE mg/dL
Specific Gravity, Urine: 1.026 (ref 1.005–1.030)
Urobilinogen, UA: 1 mg/dL (ref 0.0–1.0)
pH: 6 (ref 5.0–8.0)

## 2010-03-30 LAB — CBC
HCT: 47.7 % (ref 39.0–52.0)
Hemoglobin: 16.5 g/dL (ref 13.0–17.0)
MCH: 29.2 pg (ref 26.0–34.0)
MCHC: 34.6 g/dL (ref 30.0–36.0)
MCV: 84.4 fL (ref 78.0–100.0)
Platelets: 226 10*3/uL (ref 150–400)
RBC: 5.65 MIL/uL (ref 4.22–5.81)
RDW: 13.9 % (ref 11.5–15.5)
WBC: 13.4 10*3/uL — ABNORMAL HIGH (ref 4.0–10.5)

## 2010-06-01 NOTE — H&P (Signed)
Dale [name], Dale [name]                ACCOUNT NO.:  [id number]   MEDICAL RECORD NO.:  [id number]          PATIENT TYPE:  INP   LOCATION:  3739                         FACILITY:  [facility]   PHYSICIAN:  Learta Codding, MD,FACC DATE OF BIRTH:  1950-09-29   DATE OF ADMISSION:  09/23/2006  DATE OF DISCHARGE:                              HISTORY & PHYSICAL   PRIMARY CARDIOLOGIST:  [facility], being seen by Dr. Andee Lineman.   PRIMARY CARE Zury Fazzino:  Dr. Jacalyn Lefevre   PATIENT PROFILE:  This is a 60 year old Caucasian male without prior  history of CAD who presents to the [facility] ED following an episode of  chest discomfort.   PROBLEM LIST:  1. Chest pain, unstable angina.  2. Rheumatic fever as a child.  3. Hyperlipidemia, which is untreated.  4. Questionable history of TIA in 1998.  5. History of Bell's palsy.  6. Osteoarthritis, status post right knee surgery x2.  7. Rumor of tobacco abuse.   HISTORY OF PRESENT ILLNESS:  This is a 60 year old Caucasian male with  no prior history of CAD.  Over the past 2 weeks he has had constant 2 to  5/10 left-sided chest pain under my ribcage.  He has had some  tenderness, as well as some pleuritic component of this discomfort, as  well.  This pain has not limited his activity.  He has also had  decreased energy and easy fatigability over the past 3 to 4 weeks.  This  morning he was working in his yard carrying some small azalea bushes and  developed 10/10 substernal chest pain and pressure with mild shortness  of breath.  He had it for about 5 minutes with some decrease in pain and  then resumed activities with continued symptoms, which were  intermittently worsened.  He came in to the ED after about 60 minutes of  persistent chest pain.  An ECG here shows early repolarization.  His  first set of markers are negative and the pain is now 2/10, although he  looks fairly comfortable.   ALLERGIES:  No known drug allergies.   HOME MEDICATIONS:   None. He was given aspirin here.   FAMILY HISTORY:  Mother is alive with arthritis and diabetes and is 75  years old.  Father died of a brain tumor at age 30.  He has 2 brothers,  1 brother having diabetes.  He has 2 sisters who are both alive and  well.   SOCIAL HISTORY:  He lives in [place] and works as a Engineer, manufacturing.  He previously smoked pipes and cigars, maybe 2 a week for 20  years, and quit about 7 years ago.  He will occasionally have a beer.  He denies drug use.  He is active and does walk.   REVIEW OF SYSTEMS:  Positive for chest pain and shortness of breath.  He  has had decreased energy and fatigue for 2 to 4 weeks.  Otherwise, all  systems were reviewed and were negative.   PHYSICAL EXAMINATION:  VITAL SIGNS:  Temperature 97, heart rate 71,  respirations 18, blood pressure 125/89, pulse ox 95% on room air.  GENERAL:  Pleasant white male in no acute distress, awake, alert and  oriented x3.  NECK:  No bruits or JVD.  LUNGS:  Respirations were unlabored, clear to auscultation.  CARDIAC:  Regular S1, S2, no S3, S4 or murmurs.  ABDOMEN:  Round, soft, nontender, nondistended.  Bowel sounds are  present x4.  EXTREMITIES:  Warm and dry, pink, no clubbing, cyanosis or edema.  Dorsalis pedis and posterior tibial pulses 2+ and equal bilaterally.   ACCESSORY CLINICAL FINDINGS:  Chest x-ray pending.  EKG shows sinus  rhythm with early repolarization, rate of 65 beats per minute.  Hemoglobin 15.3, hematocrit 45, sodium 141, potassium 4.3, chloride 110,  CO2 24.5, BUN 18, creatinine 1.3, glucose 86, CK-MB 1.7, troponin-I less  than 0.05.   ASSESSMENT/PLAN:  1. Unstable angina.  Patient presents with 2 types of chest pain:  1)      Constant x2 weeks with a tender and musculoskeletal component, as      well as pleuritic component.  2. Exertional chest pain this morning, improved with rest, which is      concerning for unstable angina.  Plan to admit and check  cardiac      markers, which are currently negative.  Add heparin and      nitroglycerin, beta-blocker and statin.  Plan for cardiac      catheterization on Monday.  3. Hyperlipidemia, previously on medications which he self      discontinued.  Will plan to add simvastatin and check lipids and      LFTs.      Nicolasa Ducking, ANP      Learta Codding, MD,FACC  Electronically Signed    CB/MEDQ  D:  09/23/2006  T:  09/24/2006  Job:  [id number]

## 2010-06-01 NOTE — Cardiovascular Report (Signed)
[name], [name]                ACCOUNT NO.:  [id number]   MEDICAL RECORD NO.:  [id number]          PATIENT TYPE:  INP   LOCATION:  3739                         FACILITY:  [facility]   PHYSICIAN:  Veverly Fells. Excell Seltzer, MD  DATE OF BIRTH:  [birth year]   DATE OF PROCEDURE:  09/25/2006  DATE OF DISCHARGE:                            CARDIAC CATHETERIZATION   PROCEDURE:  Left heart catheterization, selective coronary angiography,  left ventricular angiography and StarClose to the right femoral artery.   INDICATIONS:  Dale [name] is a 60 year old gentleman with multiple  cardiovascular risk factors who has no known history of coronary artery  disease.  He presented to the hospital with chest pain.  He has 2  distinct types of chest pain.  One is a constant musculoskeletal type  pain under the left rib cage.  His second complaint is exertional chest  discomfort and dyspnea over the past few weeks.  With his concerning  symptoms and multiple risk factors, he was referred for cardiac  catheterization.  He did not have objective findings of ischemia or  necrosis, as he had an unremarkable ECG and negative cardiac biomarkers.   Risks and indications of the procedure were reviewed with the patient.  Informed consent was obtained.  Right groin was prepped, draped,  anesthetized with 1% lidocaine.  Using modified Seldinger technique, a 6-  French sheath was placed in the right femoral artery.  Multiple views of  the right and left coronary arteries were taken.  Following selective  coronary angiography, an angled pigtail catheter was inserted into the  left ventricle where pressures were recorded.  Left ventriculogram was  performed.  Pullback across the aortic valve was done at the completions  of the procedure.  A StarClose device was used to seal the femoral  arteriotomy.  There were no immediate complications.   FINDINGS:  Aortic pressure 112/64 with a mean of 84, left ventricular  pressure 119 over  18.   CORONARY ANGIOGRAPHY:  The left main coronary artery is angiographically  normal and bifurcates into the LAD and left circumflex.   The LAD is large-caliber vessel proximally.  There is no angiographic  disease.  The first and second diagonal branches are both medium-size  and have no angiographic stenosis.  After the second diagonal branch,  the LAD tapers into a medium-sized vessel, and there is a long segment  of 30% stenosis in the mid-LAD where the artery appears phasic with  minor bridging.  Distally, there is no significant angiographic stenosis  in the LAD.  The LAD extends down and wraps around the LV apex.   The left circumflex is a large-caliber vessel that courses down and  supplies 3 small OM branches and a very large left posterolateral  branch.  There is no angiographic stenosis throughout the left  circumflex.   The right coronary artery is dominant.  It supplies a PDA branch and a  posterior AV segment, which supplies one posterolateral branch.  There  is an RV marginal branch as well.  There is no angiographic stenosis in  the right coronary artery.  Left ventricular function assessed by RAO ventriculography is normal.  The LVEF is estimated at 55%.   ASSESSMENT:  1. Minor nonobstructive left anterior descending stenosis.  2. Normal left circumflex and right coronary artery.  3. Normal left ventricular function.   PLAN/RECOMMENDATIONS:  Dale [name] appears to have noncardiac chest pain.  Recommend aspirin 81 mg daily and an LDL goal less than 100.  The  patient will be eligible for discharge later today.      Veverly Fells. Excell Seltzer, MD  Electronically Signed     MDC/MEDQ  D:  09/25/2006  T:  09/25/2006  Job:  [id number]

## 2010-06-01 NOTE — Discharge Summary (Signed)
NAMEJAYIN, Dale [name]                ACCOUNT NO.:  [id number]   MEDICAL RECORD NO.:  [id number]          PATIENT TYPE:  INP   LOCATION:  3739                         FACILITY:  [facility]   PHYSICIAN:  Dale Fells. Excell Seltzer, MD  DATE OF BIRTH:  October 22, 1950   DATE OF ADMISSION:  09/23/2006  DATE OF DISCHARGE:  09/25/2006                               DISCHARGE SUMMARY   PRIMARY CARDIOLOGIST:  Dr. Andee [name] and Dr. Tonny [name]   PRIMARY CARE PHYSICIAN:  Dr. Jacalyn [name]   PROCEDURES PERFORMED DURING HOSPITALIZATION:  Cardiac catheterization  completed on September 25, 2006 per Dr. Tonny [name] revealing normal  LVEF of 55% , minor nonobstructive LAD stenosis of 30% in the mid-  segment, normal left circumflex and right coronary artery, normal LV  function.   DISCHARGE DIAGNOSES:  1. Noncardiac chest pain.  2. Rheumatic fever as a child.  3. Hyperlipidemia.  4. Questionable history of transient ischemic attack in 1998.  5. History of Bell's palsy.  6. Osteoarthritis status post right knee surgery x2.  7. History of remote tobacco abuse.   HOSPITAL COURSE:  A 61 year old Caucasian male with no prior history of  CAD, had constant 2 to 5/10 left-sided chest pain under his ribcage with  some tenderness as well as pleuritic component of his discomfort.  The  pain had not limited his activity, but he also did have some decrease in  energy and easily fatigued over the last three to four weeks prior to  admission.  During the morning of admission, he was working in his yard  carrying some small bushes and developed 10/10 substernal chest pain and  pressure with shortness of breath that lasted approximately five minutes  and with some decrease in pain.  He resumed his activities, but the  symptoms continued and became worse.  He presented to the emergency room  about 60 minutes after this discomfort reoccurred.  EKG there showed  early repolarization.  His first set of cardiac markers were found  to be  negative.  The patient was started on nitroglycerin and heparin.  He was  also started on low-dose beta-blocker, Lopressor 12.5 mg b.i.d. and  admitted to rule out myocardial infarction.  Cardiac enzymes were cycled  and found to be negative with a troponin of 0.01, 0.01 and 0.02,  respectively, CK-MB was also normal along with CK.  The patient was  continued on heparin through the weekend and had cardiac catheterization  scheduled and completed by Dr. Excell [name] on September 25, 2006 with results  described above.  Please see Dr. Earmon [name] note of cardiac  catheterization for more details.  The patient was kept on bedrest for  three hours, discontinuation of heparin was also done during his  recovery.  The patient had no evidence of hematoma, bleeding or bruit  during his recovery time and no reoccurrence of chest discomfort.  The  patient was restarted on Zocor 40 mg p.o. q.h.s., as he has been on a  statin prior to his admission, but discontinued these on his own.  This  was reinstituted during hospitalization along with  aspirin.   The patient was found to be stable on discharge and seen and examined by  Dr. Tonny [name] and myself.  The patient will be discharged home  today on Zocor and aspirin and to have continued followup with his  primary care physician, Dr. Jacalyn [name], for continued medical  management.  The patient will also have a two-week post catheterization  check completed by our heart team and further recommendations as  necessary and followup with cardiologist if necessary.   DISCHARGE LABS:  Sodium 143, potassium 3.9, chloride 109, CO2 28,  glucose 98, BUN 13, creatinine 1.15.  CK 120, 115 and 92, respectively.  CK-MB 3.4, 2.4 and 2.0, respectively.  Troponin 0.01, 0.01 and 0.02,  respectively.  Cholesterol 156, triglycerides 103, HDL 28, LDL 107 (goal  to be less than 100).  Hemoglobin 13.9, hematocrit 40.4, white blood  cells 5.4, platelets 233.   VITAL  SIGNS ON DISCHARGE:  Blood pressure 104/72, heart rate 62,  respirations 20, temperature 97.3.   DISCHARGE MEDICATIONS:  1. Zocor 40 mg one p.o. at h.s.  2. Aspirin 325 mg daily.   ALLERGIES:  No known drug allergies.   FOLLOWUP PLANS AND APPOINTMENTS:  1. The patient will follow up with his primary care physician, Dr.      Hyacinth [name], on his own accord for continued medical management.  2. The patient is to follow up with [facility] on October 04, 2006 at 2:45 p.m. for post catheterization check.  3. The patient will need to have followup lipids and LFTs completed      within six weeks of reinstitution of Zocor which can be completed      through our office or through primary care physician at his      discretion.  4. The patient has been given post cardiac catheterization      instructions with particular emphasis on the right groin site for      evidence of hematoma, bleeding, signs of infection or severe pain;      he is to notify the office immediately.   Time spent with the patient to include physician time 45 minutes.      Dale Mare. Lyman Bishop, NP      Dale Fells. Excell Seltzer, MD  Electronically Signed    KML/MEDQ  D:  09/25/2006  T:  09/25/2006  Job:  [id number]   cc:   Dale [name]. Dale [name], M.D.

## 2010-10-29 LAB — TSH: TSH: 2.184

## 2010-10-29 LAB — CARDIAC PANEL(CRET KIN+CKTOT+MB+TROPI)
CK, MB: 2
CK, MB: 2.4
Relative Index: 2.1
Relative Index: INVALID
Total CK: 115
Total CK: 92
Troponin I: 0.01
Troponin I: 0.02

## 2010-10-29 LAB — DIFFERENTIAL
Basophils Absolute: 0
Basophils Relative: 1
Eosinophils Absolute: 0.1
Eosinophils Relative: 3
Lymphocytes Relative: 30
Lymphs Abs: 1.7
Monocytes Absolute: 0.5
Monocytes Relative: 9
Neutro Abs: 3.2
Neutrophils Relative %: 57

## 2010-10-29 LAB — CBC
HCT: 40.4
HCT: 41.6
HCT: 43.8
Hemoglobin: 13.9
Hemoglobin: 14.1
Hemoglobin: 14.9
MCHC: 34
MCHC: 34
MCHC: 34.3
MCV: 84.1
MCV: 84.3
MCV: 85.2
Platelets: 233
Platelets: 256
Platelets: 278
RBC: 4.79
RBC: 4.88
RBC: 5.2
RDW: 14.5 — ABNORMAL HIGH
RDW: 14.6 — ABNORMAL HIGH
RDW: 14.9 — ABNORMAL HIGH
WBC: 5.4
WBC: 5.6
WBC: 6.4

## 2010-10-29 LAB — I-STAT 8, (EC8 V) (CONVERTED LAB)
Acid-Base Excess: 1
BUN: 18
Bicarbonate: 24.5 — ABNORMAL HIGH
Chloride: 110
Glucose, Bld: 86
HCT: 45
Hemoglobin: 15.3
Operator id: 196461
Potassium: 4.3
Sodium: 141
TCO2: 25
pCO2, Ven: 34.2 — ABNORMAL LOW
pH, Ven: 7.462 — ABNORMAL HIGH

## 2010-10-29 LAB — COMPREHENSIVE METABOLIC PANEL
ALT: 19
AST: 20
Albumin: 3.2 — ABNORMAL LOW
Alkaline Phosphatase: 74
BUN: 13
CO2: 28
Calcium: 8.7
Chloride: 109
Creatinine, Ser: 1.15
GFR calc Af Amer: >60
GFR calc non Af Amer: >60
Glucose, Bld: 98
Potassium: 3.9
Sodium: 143
Total Bilirubin: 0.5
Total Protein: 5.6 — ABNORMAL LOW

## 2010-10-29 LAB — POCT I-STAT CREATININE
Creatinine, Ser: 1.3
Operator id: 196461

## 2010-10-29 LAB — APTT
aPTT: 34
aPTT: >200

## 2010-10-29 LAB — LIPID PANEL
Cholesterol: 156
HDL: 28 — ABNORMAL LOW
LDL Cholesterol: 107 — ABNORMAL HIGH
Total CHOL/HDL Ratio: 5.6
Triglycerides: 103
VLDL: 21

## 2010-10-29 LAB — PROTIME-INR
INR: 1
INR: 1.1
Prothrombin Time: 13.1
Prothrombin Time: 14.6

## 2010-10-29 LAB — TROPONIN I: Troponin I: 0.01

## 2010-10-29 LAB — HEPARIN LEVEL (UNFRACTIONATED)
Heparin Unfractionated: 0.64
Heparin Unfractionated: 0.79 — ABNORMAL HIGH
Heparin Unfractionated: 0.87 — ABNORMAL HIGH
Heparin Unfractionated: 1 — ABNORMAL HIGH

## 2010-10-29 LAB — CK TOTAL AND CKMB (NOT AT ARMC)
CK, MB: 3.4
Relative Index: 2.8 — ABNORMAL HIGH
Total CK: 120

## 2010-10-29 LAB — POCT CARDIAC MARKERS
CKMB, poc: 1.7
Myoglobin, poc: 87.4
Operator id: 196461
Troponin i, poc: <0.05

## 2012-08-28 ENCOUNTER — Encounter: Payer: Self-pay | Admitting: Endocrinology

## 2012-08-30 ENCOUNTER — Other Ambulatory Visit: Payer: Self-pay | Admitting: Family Medicine

## 2012-08-30 DIAGNOSIS — Z0000 Encounter for general adult medical examination without abnormal findings: Secondary | ICD-10-CM

## 2012-09-06 ENCOUNTER — Ambulatory Visit
Admission: RE | Admit: 2012-09-06 | Discharge: 2012-09-06 | Disposition: A | Discharge status: Home / Self Care | Payer: BC Managed Care – PPO | Source: Ambulatory Visit | Attending: Family Medicine | Admitting: Family Medicine

## 2012-09-06 DIAGNOSIS — Z0000 Encounter for general adult medical examination without abnormal findings: Secondary | ICD-10-CM

## 2012-09-11 ENCOUNTER — Other Ambulatory Visit: Payer: Self-pay | Admitting: Family Medicine

## 2012-09-11 DIAGNOSIS — E041 Nontoxic single thyroid nodule: Secondary | ICD-10-CM

## 2012-09-18 ENCOUNTER — Other Ambulatory Visit (HOSPITAL_COMMUNITY)
Admission: RE | Admit: 2012-09-18 | Discharge: 2012-09-18 | Disposition: A | Discharge status: Home / Self Care | Payer: BC Managed Care – PPO | Source: Ambulatory Visit | Attending: Interventional Radiology | Admitting: Interventional Radiology

## 2012-09-18 ENCOUNTER — Ambulatory Visit
Admission: RE | Admit: 2012-09-18 | Discharge: 2012-09-18 | Disposition: A | Discharge status: Home / Self Care | Payer: BC Managed Care – PPO | Source: Ambulatory Visit | Attending: Family Medicine | Admitting: Family Medicine

## 2012-09-18 DIAGNOSIS — E041 Nontoxic single thyroid nodule: Secondary | ICD-10-CM

## 2012-10-29 ENCOUNTER — Other Ambulatory Visit: Payer: Self-pay | Admitting: Dermatology

## 2012-10-29 DIAGNOSIS — C4491 Basal cell carcinoma of skin, unspecified: Secondary | ICD-10-CM

## 2012-10-29 HISTORY — DX: Basal cell carcinoma of skin, unspecified: C44.91

## 2013-11-27 ENCOUNTER — Other Ambulatory Visit: Payer: Self-pay | Admitting: Dermatology

## 2013-11-27 DIAGNOSIS — C4492 Squamous cell carcinoma of skin, unspecified: Secondary | ICD-10-CM

## 2013-11-27 HISTORY — DX: Squamous cell carcinoma of skin, unspecified: C44.92

## 2014-09-24 LAB — PSA: PSA: 1.52

## 2015-06-18 ENCOUNTER — Encounter (HOSPITAL_COMMUNITY): Payer: Self-pay

## 2015-06-18 ENCOUNTER — Emergency Department (HOSPITAL_COMMUNITY): Payer: BLUE CROSS/BLUE SHIELD

## 2015-06-18 ENCOUNTER — Emergency Department (HOSPITAL_COMMUNITY)
Admission: EM | Admit: 2015-06-18 | Discharge: 2015-06-18 | Disposition: A | Discharge status: Home / Self Care | Payer: BLUE CROSS/BLUE SHIELD | Attending: Emergency Medicine | Admitting: Emergency Medicine

## 2015-06-18 DIAGNOSIS — Z8739 Personal history of other diseases of the musculoskeletal system and connective tissue: Secondary | ICD-10-CM | POA: Insufficient documentation

## 2015-06-18 DIAGNOSIS — Z87448 Personal history of other diseases of urinary system: Secondary | ICD-10-CM | POA: Diagnosis not present

## 2015-06-18 DIAGNOSIS — R51 Headache: Secondary | ICD-10-CM | POA: Diagnosis present

## 2015-06-18 DIAGNOSIS — G4489 Other headache syndrome: Secondary | ICD-10-CM | POA: Insufficient documentation

## 2015-06-18 DIAGNOSIS — I10 Essential (primary) hypertension: Secondary | ICD-10-CM | POA: Insufficient documentation

## 2015-06-18 DIAGNOSIS — Z880 Allergy status to penicillin: Secondary | ICD-10-CM | POA: Insufficient documentation

## 2015-06-18 DIAGNOSIS — Z8639 Personal history of other endocrine, nutritional and metabolic disease: Secondary | ICD-10-CM | POA: Diagnosis not present

## 2015-06-18 HISTORY — DX: Essential (primary) hypertension: I10

## 2015-06-18 NOTE — ED Provider Notes (Signed)
CSN: [id number]     Arrival date & time 06/18/15  1830 History  By signing my name below, I, [name], attest that this documentation has been prepared under the direction and in the presence of [name], MD. Electronically Signed: [name], ED Scribe. 06/18/2015. 11:26 PM     Chief Complaint  Patient presents with  . sent for CT for headaches/scalp deformity     Patient is a 65 y.o. male presenting with neurologic complaint. The history is provided by the patient. No language interpreter was used.  Neurologic Problem This is a new problem. The current episode started more than 1 week ago. The problem occurs constantly. The problem has been gradually worsening. Associated symptoms include headaches. Pertinent negatives include no chest pain and no abdominal pain. Nothing aggravates the symptoms. Nothing relieves the symptoms. He has tried nothing for the symptoms.   Dale Wood is a 65 y.o. male with PMHx of HTN and HLD who presents to the Emergency Department complaining of a worsening area of indentation at the scalp with onset 6 weeks ago. Pt states that there is a groove at the mid to right portion of his scalp that has grown in size and increased in depth over the last several weeks. He has had no recent head trauma or falls. Associated symptoms include a brief episode of altered sensation at the left side of the face and intermittent temporal headache for the last 2 days. The sensation at the side of his face was similar to what he had 16 years ago with Bell's Palsy. He states that initially pressing the indentation caused headache but recently they have occurred spontaneously at the temples. Pt denies fever, blurred vision, new weakness, and vomiting. He was seen at an [facility] walk-in clinic for his symptoms today and referred to the ED for further evaluation and a CT head.    Past Medical History  Diagnosis Date  . Rheumatic fever   . Hyperlipidemia   . Low  testosterone   . Nephrolithiasis, uric acid   . Unspecified disorder of kidney and ureter   . Hypertension    History reviewed. No pertinent past surgical history. Family History  Problem Relation Age of Onset  . Cancer Mother   . Diabetes Brother    Social History  Substance Use Topics  . Smoking status: Never Smoker   . Smokeless tobacco: None  . Alcohol Use: None    Review of Systems  HENT:       Indentation at right scalp   Eyes: Negative for visual disturbance.  Cardiovascular: Negative for chest pain.  Gastrointestinal: Negative for nausea, vomiting and abdominal pain.  Neurological: Positive for headaches. Negative for weakness.  All other systems reviewed and are negative.  Allergies  Androgel and Penicillins  Home Medications   Prior to Admission medications   Not on File   BP 170/93 mmHg  Pulse 70  Temp(Src) 97.9 F (36.6 C) (Oral)  Resp 18  Ht 6' (1.829 m)  Wt 245 lb (111.131 kg)  BMI 33.22 kg/m2  SpO2 97% Physical Exam CONSTITUTIONAL: Well developed/well nourished HEAD: Normocephalic/atraumatic, mild indentation to right frontal scalp, no step offs or crepitance, skull is firm to palpation, no bogginess noted EYES: EOMI/PERRL, no nystagmus, no ptosis ENMT: Mucous membranes moist NECK: supple no meningeal signs, no bruits SPINE/BACK:entire spine nontender CV: S1/S2 noted, no murmurs/rubs/gallops noted LUNGS: Lungs are clear to auscultation bilaterally, no apparent distress ABDOMEN: soft, nontender, no rebound or guarding GU:no cva  tenderness NEURO:Awake/alert, face symmetric, no arm or leg drift is noted Equal 5/5 strength with shoulder abduction, elbow flex/extension, wrist flex/extension in upper extremities and equal hand grips bilaterally Equal 5/5 strength with hip flexion,knee flex/extension, foot dorsi/plantar flexion Cranial nerves 3/4/5/6/07/25/08/11/12 tested and intact Gait normal without ataxia No past pointing Sensation to light touch  intact in all extremities EXTREMITIES: pulses normal, full ROM SKIN: warm, color normal PSYCH: no abnormalities of mood noted, alert and oriented to situation  ED Course  Procedures   DIAGNOSTIC STUDIES: Oxygen Saturation is 97% on RA,  normal by my interpretation.    COORDINATION OF CARE: 11:15 PM Discussed treatment plan which includes CT head with pt at bedside and pt agreed to plan. Pt declines pain meds Discussed strict return precautions Referred to neuro for HA evaluations  Imaging Review Ct Head Wo Contrast  06/18/2015  CLINICAL DATA:  Recurrent headache. EXAM: CT HEAD WITHOUT CONTRAST TECHNIQUE: Contiguous axial images were obtained from the base of the skull through the vertex without intravenous contrast. COMPARISON:  CT scan October 29, 2005 FINDINGS: There is mild mucosal thickening in the ethmoid sinuses. There is opacification of a few inferior mastoid air cells bilaterally with no overlying erosion or soft tissue abnormality. The middle ears are well aerated. The bones and extracranial soft tissues are normal. No subdural, epidural, or subarachnoid hemorrhage. Ventricles and sulci are unremarkable. No mass, mass effect, or midline shift. No acute cortical ischemia or infarct. Cerebellum, brainstem, and basal cisterns are normal. IMPRESSION: 1. No acute intracranial process. 2. Minimal fluid in the inferior mastoid air cells with no overlying erosion or soft tissue swelling. Electronically Signed   By: Dorise Bullion III M.D   On: 06/18/2015 19:34     MDM   Final diagnoses:  Other headache syndrome    Nursing notes including past medical history and social history reviewed and considered in documentation    I personally performed the services described in this documentation, which was scribed in my presence. The recorded information has been reviewed and is accurate.       [name], MD 06/19/15 [phone]

## 2015-06-18 NOTE — ED Notes (Signed)
Pt stable, ambulatory, states understanding of discharge instructions 

## 2015-06-18 NOTE — ED Notes (Signed)
Patient here from [facility] for CT head and hypertensive medication.  Deformity noted to top of scalp x 6 weeks. Developed increased headache for past several days. Denies trauma. No blurred vision, alert and oriented.

## 2015-06-18 NOTE — Discharge Instructions (Signed)

## 2015-07-30 ENCOUNTER — Encounter: Payer: Self-pay | Admitting: Neurology

## 2015-07-30 ENCOUNTER — Ambulatory Visit (INDEPENDENT_AMBULATORY_CARE_PROVIDER_SITE_OTHER): Payer: BLUE CROSS/BLUE SHIELD | Admitting: Neurology

## 2015-07-30 ENCOUNTER — Telehealth: Payer: Self-pay | Admitting: Neurology

## 2015-07-30 VITALS — BP 157/98 | HR 74 | Ht 72.0 in | Wt 245.8 lb

## 2015-07-30 DIAGNOSIS — R51 Headache: Secondary | ICD-10-CM | POA: Diagnosis not present

## 2015-07-30 DIAGNOSIS — R519 Headache, unspecified: Secondary | ICD-10-CM

## 2015-07-30 HISTORY — DX: Headache, unspecified: R51.9

## 2015-07-30 NOTE — Patient Instructions (Addendum)
-   will do MRI brain with and without contrast  - will draw blood to rule out kidney insufficiency  - be gentle to the right frontal groove area and to see if improves pain - relaxation and less stress - follow up in one month.

## 2015-07-31 LAB — COMPREHENSIVE METABOLIC PANEL
ALT: 17 IU/L (ref 0–44)
AST: 18 IU/L (ref 0–40)
Albumin/Globulin Ratio: 1.6 (ref 1.2–2.2)
Albumin: 4.2 g/dL (ref 3.6–4.8)
Alkaline Phosphatase: 98 IU/L (ref 39–117)
BUN/Creatinine Ratio: 14 (ref 10–24)
BUN: 18 mg/dL (ref 8–27)
Bilirubin Total: 0.3 mg/dL (ref 0.0–1.2)
CO2: 25 mmol/L (ref 18–29)
Calcium: 8.9 mg/dL (ref 8.6–10.2)
Chloride: 103 mmol/L (ref 96–106)
Creatinine, Ser: 1.29 mg/dL — ABNORMAL HIGH (ref 0.76–1.27)
GFR calc Af Amer: 67 mL/min/{1.73_m2} (ref >59)
GFR calc non Af Amer: 58 mL/min/{1.73_m2} — ABNORMAL LOW (ref >59)
Globulin, Total: 2.6 g/dL (ref 1.5–4.5)
Glucose: 90 mg/dL (ref 65–99)
Potassium: 4.1 mmol/L (ref 3.5–5.2)
Sodium: 141 mmol/L (ref 134–144)
Total Protein: 6.8 g/dL (ref 6.0–8.5)

## 2015-07-31 NOTE — Progress Notes (Signed)
NEUROLOGY CLINIC NEW PATIENT NOTE  NAME: Dale Wood DOB: Nov 18, 1950 REFERRING PHYSICIAN: Janith Lima, MD  I saw Dale Wood as a new consult in the neurovascular clinic today regarding  Chief Complaint  Patient presents with  . Referral    Referral from [facility]  for Headaches. Pt has groove formed in skull  .  HPI: Dale Wood is a 65 y.o. male with PMH of HLD who presents as a new patient for HA at scalp indentation .   Patient stated that 3-4 months ago he noticed indentation at right frontal area, very small initially no pain. However, as time goes by, the indentation become longer and deeper, he started to have headaches around the right frontal region especially on palpation or press on the indentation. Sometimes the headache radiating to bilateral temporal lobe and the bilateral vertex. Denies any occipital neuralgia, vision changes, swallowing difficulty, weakness or numbness. Had a CT head showed no acute intracranial process. Follow up with PCP, concerning for sinusitis, prescribed Augmentin and Tylenol. He was referred here for further evaluation.  He has a history of right shoulder injury to 3 years ago after a fall, associated with right hand and arm numbness intermittently, follow up with orthopedics, was told due to neck injury. No surgery or physical therapy required.  He is currently not on any medication, denies smoking, alcohol or illicit drug use. Has family history of brain tumor, his father died of brain tumor.   Past Medical History  Diagnosis Date  . Rheumatic fever   . Hyperlipidemia   . Low testosterone   . Nephrolithiasis, uric acid   . Unspecified disorder of kidney and ureter   . Hypertension   . Kidney stones    History reviewed. No pertinent past surgical history. Family History  Problem Relation Age of Onset  . Cancer Mother   . Diabetes Brother    No current outpatient prescriptions on file.   No current facility-administered  medications for this visit.   Allergies  Allergen Reactions  . Androgel [Testosterone]   . Penicillins    Social History   Social History  . Marital Status: Married    Spouse Name: N/A  . Number of Children: N/A  . Years of Education: N/A   Occupational History  . Not on file.   Social History Main Topics  . Smoking status: Never Smoker   . Smokeless tobacco: Not on file  . Alcohol Use: 0.6 oz/week    1 Cans of beer per week     Comment: occssionally  . Drug Use: Not on file  . Sexual Activity: Not on file   Other Topics Concern  . Not on file   Social History Narrative    Review of Systems Full 14 system review of systems performed and notable only for those listed, all others are neg:  Constitutional:   Cardiovascular:  Ear/Nose/Throat:   Skin:  Eyes:   Respiratory:   Gastroitestinal:   Genitourinary: Impotence Hematology/Lymphatic:   Endocrine:  Musculoskeletal:   Allergy/Immunology:   Neurological:  Headache Psychiatric:  Sleep:    Physical Exam  Filed Vitals:   07/30/15 1411  BP: 157/98  Pulse: 74    General - Well nourished, well developed, in no apparent distress.  Ophthalmologic - Sharp disc margins OU.  Cardiovascular - Regular rate and rhythm with no murmur.   Neck - supple, no nuchal rigidity.  Head - indentation at right frontal near midline, about 5 cm longer, no skin  changes. Pressure on the indentation with feeling of aching HA near the frontal region.   Mental Status -  Level of arousal and orientation to time, place, and person were intact. Language including expression, naming, repetition, comprehension, reading, and writing was assessed and found intact. Attention span and concentration were normal. Recent and remote memory were intact. Fund of Knowledge was assessed and was intact.  Cranial Nerves II - XII - II - Visual field intact OU. III, IV, VI - Extraocular movements intact. V - Facial sensation intact  bilaterally. VII - Facial movement intact bilaterally. VIII - Hearing & vestibular intact bilaterally. X - Palate elevates symmetrically. XI - Chin turning & shoulder shrug intact bilaterally. XII - Tongue protrusion intact.  Motor Strength - The patient's strength was normal in all extremities and pronator drift was absent.  Bulk was normal and fasciculations were absent.   Motor Tone - Muscle tone was assessed at the neck and appendages and was normal.  Reflexes - The patient's reflexes were normal in all extremities and he had no pathological reflexes.  Sensory - Light touch, temperature/pinprick, vibration and proprioception, and Romberg testing were assessed and were normal.    Coordination - The patient had normal movements in the hands and feet with no ataxia or dysmetria.  Tremor was absent.  Gait and Station - The patient's transfers, posture, gait, station, and turns were observed as normal.   Imaging CT head 06/18/2015 1. No acute intracranial process. 2. Minimal fluid in the inferior mastoid air cells with no overlying erosion or soft tissue swelling. However, the indentation can be seen and right frontal scalp, shallow, not deep enough to reach skull.  Lab Review none    Assessment and Plan:   In summary, Atthew Wood is a 65 y.o. male with PMH of HLD presents with right frontal indentation and pain on palpation. Examination showed right frontal 5 cm indentation with no skin changes. CT no acute abnormality in the brain, but right frontal indentation can be seen limited in scalp. Due to family history of brain tumor, with headache at age 61, will still proceed with MRI with and without contrast to rule out any structural brain lesion.  - will do MRI brain with and without contrast  - be gentle to the right frontal groove area and to see if improves pain - relaxation and less stress - follow up in one month.   Thank you very much for the opportunity to participate in  the care of this patient.  Please do not hesitate to call if any questions or concerns arise.  Orders Placed This Encounter  Procedures  . MR Brain W Wo Contrast    Standing Status: Future     Number of Occurrences:      Standing Expiration Date: 10/01/2016    Scheduling Instructions:     Please schedule before 08/17/15 any place in [place] as his insurance goes out in August. Thanks.    Order Specific Question:  Reason for Exam (SYMPTOM  OR DIAGNOSIS REQUIRED)    Answer:  focal pain with palpitation on the indentation in the scalp at right frontal near midline    Order Specific Question:  Preferred imaging location?    Answer:  Internal    Order Specific Question:  Does the patient have a pacemaker or implanted devices?    Answer:  No    Order Specific Question:  What is the patient's sedation requirement?    Answer:  No Sedation  .  CMP    No orders of the defined types were placed in this encounter.    Patient Instructions  - will do MRI brain with and without contrast  - will draw blood to rule out kidney insufficiency  - be gentle to the right frontal groove area and to see if improves pain - relaxation and less stress - follow up in one month.     Rosalin Hawking, MD PhD [facility] [address] [place], [place] [address] [phone]

## 2015-08-04 NOTE — Telephone Encounter (Signed)
Patient called to schedule MRI, states he hasn't heard from anyone. Advised Andee Poles has left for the day, will be in the office tomorrow. Please call [phone].

## 2015-08-05 NOTE — Telephone Encounter (Signed)
Pt called said his [facility] insurance terminates 08/17/15 and he has met the out of pocket for this yr. Aug 1 medicare will start and he doesn't want to wait until then. Please call asap, he would like to have it here.

## 2015-08-06 NOTE — Telephone Encounter (Signed)
Called the patient's wife and scheduled MRI for next Wednesday the 26th. The patient was due to come in for a follow up to go over results the Monday before that. We canceled that apt and his wife would like to know if he can come in Thursday to go over results. He will also need medication for nerves about the MRI. Please call and advise. Thanks

## 2015-08-06 NOTE — Telephone Encounter (Signed)
Katrina, please give pt Xanax for MRI.   For MRI order, I clearly put in instruction to schedule before 08/17/15, I do not know why this did not go through.   For appointment, I clearly indicated last visit that I will see pt in one month. I do not know why pt was scheduled in just 11 days.   There are some communication problems here. Please check it out. Thanks.  Rosalin Hawking, MD PhD Stroke Neurology 08/06/2015 6:15 PM

## 2015-08-06 NOTE — Telephone Encounter (Signed)
Rn call patients about needing something for his nerves for the MRI on Thursday. Rn explain that the xanax would be 2 to 3 pills for his MRI.RN stated the prescription can be fax or he can pick it up on Monday. Pt will pick up on MOnday.

## 2015-08-07 ENCOUNTER — Other Ambulatory Visit: Payer: Self-pay | Admitting: Neurology

## 2015-08-07 NOTE — Telephone Encounter (Signed)
I canceled the follow up apt because the patient's wife instructed that the apt was made in order to go over MRI results and since he is having his MRI after the scheduled apt time we went ahead and canceled it. I spoke with Angela Nevin at check out (she is the one that made the follow up apt) she stated that the patient requested to come in for the follow up at that time because his insurance was going to run out at the end of July and since Dr. Erlinda Hong was not working the following week in  [place] office, that was the only option. Also, the patient's MRI is scheduled before 08/17/15. He has an apt in our office on the 26th to get his MRI.

## 2015-08-07 NOTE — Telephone Encounter (Signed)
Thank you, Dale Wood, for the message. That makes a lot sense now. Really appreciate. I will call pt and set up appointment according to his MRI result and his insurance status. Thanks again.  Rosalin Hawking, MD PhD Stroke Neurology 08/07/2015 11:36 PM

## 2015-08-07 NOTE — Telephone Encounter (Signed)
Patients appt was cancel per check out.

## 2015-08-10 ENCOUNTER — Other Ambulatory Visit: Payer: Self-pay

## 2015-08-10 ENCOUNTER — Ambulatory Visit: Payer: BLUE CROSS/BLUE SHIELD | Admitting: Neurology

## 2015-08-10 MED ORDER — ALPRAZOLAM 0.5 MG PO TABS: 0.5000 mg | ORAL_TABLET | Freq: Every evening | ORAL | 0 refills | Status: DC | PRN

## 2015-08-10 NOTE — Telephone Encounter (Signed)
Patient pick up Xanax for MRI at office.

## 2015-08-12 ENCOUNTER — Ambulatory Visit (INDEPENDENT_AMBULATORY_CARE_PROVIDER_SITE_OTHER): Payer: BLUE CROSS/BLUE SHIELD

## 2015-08-12 DIAGNOSIS — R51 Headache: Secondary | ICD-10-CM

## 2015-08-12 DIAGNOSIS — R519 Headache, unspecified: Secondary | ICD-10-CM

## 2015-08-13 ENCOUNTER — Ambulatory Visit: Payer: BLUE CROSS/BLUE SHIELD | Admitting: Neurology

## 2015-08-13 MED ORDER — GADOPENTETATE DIMEGLUMINE 469.01 MG/ML IV SOLN: 20.0000 mL | Freq: Once | INTRAVENOUS | Status: DC | PRN

## 2015-08-18 ENCOUNTER — Telehealth: Payer: Self-pay | Admitting: Neurology

## 2015-08-18 NOTE — Telephone Encounter (Signed)
Patient is calling to get his MRI results.

## 2015-08-18 NOTE — Telephone Encounter (Signed)
Discussed with pt over the phone. Delivered MRI result to him. There is no abnormalities in the brain or skull. His indentation is superficial in the scalp.   He stated that he still has HA which can happen 2 days in a roll or no HA for several days. When HA starts, it is more like whole head hurts or from temporal to temporal and cross at the back of head. Pressure like, lasting 5-6 hours or all day, no triggers, could happen in the morning, afternoon or at night. Sometimes, he takes tylenol, may help some. Have not tried other medications. Denies extra stress at work or at home. No photo or phonophobia, N/V associated with HA.  I told him his HA may have nothing to do with the indentation. HA likely tension HA or cervicogenic HA. He was encouraged to stay relaxed and de-stressed, and avoid neck injury or trauma or drastic movement. Use tylenol or ibuprofen PRN as early as HA starts. I also asked him to do HA diary. And will see him on 09/09/15. He expressed understanding and appreciation.  Rosalin Hawking, MD PhD Stroke Neurology 08/18/2015 6:35 PM

## 2015-09-09 ENCOUNTER — Ambulatory Visit: Payer: BLUE CROSS/BLUE SHIELD | Admitting: Neurology

## 2015-11-04 DIAGNOSIS — Z1159 Encounter for screening for other viral diseases: Secondary | ICD-10-CM | POA: Diagnosis not present

## 2015-11-04 DIAGNOSIS — Z125 Encounter for screening for malignant neoplasm of prostate: Secondary | ICD-10-CM | POA: Diagnosis not present

## 2015-11-04 DIAGNOSIS — I10 Essential (primary) hypertension: Secondary | ICD-10-CM | POA: Diagnosis not present

## 2015-11-04 DIAGNOSIS — N400 Enlarged prostate without lower urinary tract symptoms: Secondary | ICD-10-CM | POA: Diagnosis not present

## 2015-11-04 DIAGNOSIS — Z0000 Encounter for general adult medical examination without abnormal findings: Secondary | ICD-10-CM | POA: Diagnosis not present

## 2015-11-06 DIAGNOSIS — Z1159 Encounter for screening for other viral diseases: Secondary | ICD-10-CM | POA: Diagnosis not present

## 2015-11-06 DIAGNOSIS — I10 Essential (primary) hypertension: Secondary | ICD-10-CM | POA: Diagnosis not present

## 2015-11-06 DIAGNOSIS — N400 Enlarged prostate without lower urinary tract symptoms: Secondary | ICD-10-CM | POA: Diagnosis not present

## 2015-11-06 DIAGNOSIS — Z1211 Encounter for screening for malignant neoplasm of colon: Secondary | ICD-10-CM | POA: Diagnosis not present

## 2015-11-06 DIAGNOSIS — Z125 Encounter for screening for malignant neoplasm of prostate: Secondary | ICD-10-CM | POA: Diagnosis not present

## 2015-11-06 LAB — PSA: PSA: 2.15

## 2015-11-07 LAB — HM HEPATITIS C SCREENING LAB: HM Hepatitis Screen: NEGATIVE

## 2015-11-23 DIAGNOSIS — M799 Soft tissue disorder, unspecified: Secondary | ICD-10-CM | POA: Diagnosis not present

## 2015-11-23 DIAGNOSIS — I10 Essential (primary) hypertension: Secondary | ICD-10-CM | POA: Diagnosis not present

## 2015-11-23 DIAGNOSIS — R0781 Pleurodynia: Secondary | ICD-10-CM | POA: Diagnosis not present

## 2016-03-02 DIAGNOSIS — G8929 Other chronic pain: Secondary | ICD-10-CM | POA: Diagnosis not present

## 2016-03-02 DIAGNOSIS — M1711 Unilateral primary osteoarthritis, right knee: Secondary | ICD-10-CM | POA: Diagnosis not present

## 2016-03-02 DIAGNOSIS — S7001XA Contusion of right hip, initial encounter: Secondary | ICD-10-CM | POA: Diagnosis not present

## 2016-03-02 DIAGNOSIS — M25561 Pain in right knee: Secondary | ICD-10-CM | POA: Diagnosis not present

## 2016-04-19 DIAGNOSIS — M1711 Unilateral primary osteoarthritis, right knee: Secondary | ICD-10-CM | POA: Diagnosis not present

## 2016-06-07 DIAGNOSIS — M1711 Unilateral primary osteoarthritis, right knee: Secondary | ICD-10-CM | POA: Diagnosis not present

## 2016-06-14 DIAGNOSIS — M1711 Unilateral primary osteoarthritis, right knee: Secondary | ICD-10-CM | POA: Diagnosis not present

## 2016-06-15 ENCOUNTER — Other Ambulatory Visit: Payer: Self-pay | Admitting: Dermatology

## 2016-06-15 DIAGNOSIS — D229 Melanocytic nevi, unspecified: Secondary | ICD-10-CM | POA: Diagnosis not present

## 2016-06-15 DIAGNOSIS — D492 Neoplasm of unspecified behavior of bone, soft tissue, and skin: Secondary | ICD-10-CM | POA: Diagnosis not present

## 2016-06-15 DIAGNOSIS — L820 Inflamed seborrheic keratosis: Secondary | ICD-10-CM | POA: Diagnosis not present

## 2016-06-15 DIAGNOSIS — L570 Actinic keratosis: Secondary | ICD-10-CM | POA: Diagnosis not present

## 2016-06-15 DIAGNOSIS — Z85828 Personal history of other malignant neoplasm of skin: Secondary | ICD-10-CM | POA: Diagnosis not present

## 2016-06-22 DIAGNOSIS — M1711 Unilateral primary osteoarthritis, right knee: Secondary | ICD-10-CM | POA: Diagnosis not present

## 2016-06-28 DIAGNOSIS — I10 Essential (primary) hypertension: Secondary | ICD-10-CM | POA: Diagnosis not present

## 2016-06-28 DIAGNOSIS — M791 Myalgia: Secondary | ICD-10-CM | POA: Diagnosis not present

## 2016-06-28 DIAGNOSIS — E7800 Pure hypercholesterolemia, unspecified: Secondary | ICD-10-CM | POA: Diagnosis not present

## 2016-06-28 DIAGNOSIS — R350 Frequency of micturition: Secondary | ICD-10-CM | POA: Diagnosis not present

## 2016-06-28 DIAGNOSIS — R6883 Chills (without fever): Secondary | ICD-10-CM | POA: Diagnosis not present

## 2016-06-28 LAB — LIPID PANEL
Cholesterol: 158 (ref 0–200)
HDL: 37 (ref 35–70)
LDL Cholesterol: 94
Triglycerides: 141 (ref 40–160)

## 2016-06-28 LAB — BASIC METABOLIC PANEL
BUN: 17 (ref 4–21)
Creatinine: 1.2 (ref 0.6–1.3)
Glucose: 88
Potassium: 4.1 (ref 3.4–5.3)
Sodium: 144 (ref 137–147)

## 2016-06-28 LAB — TSH: TSH: 8.81 — AB (ref 0.41–5.90)

## 2016-06-28 LAB — HEPATIC FUNCTION PANEL
ALT: 28 (ref 10–40)
AST: 22 (ref 14–40)
Bilirubin, Total: 0.5

## 2016-06-28 LAB — CBC AND DIFFERENTIAL
HCT: 41 (ref 41–53)
Hemoglobin: 13.5 (ref 13.5–17.5)
WBC: 4.8

## 2016-07-11 ENCOUNTER — Telehealth: Payer: Self-pay | Admitting: Internal Medicine

## 2016-07-11 NOTE — Telephone Encounter (Signed)
Pt would like to make a new patient visit for Dr. Renne Crigler. He does not have a referral.

## 2016-07-12 NOTE — Telephone Encounter (Signed)
LM for pt to call back if needed but fax # given for records to be sent

## 2016-08-25 ENCOUNTER — Encounter: Payer: Self-pay | Admitting: Endocrinology

## 2016-08-25 ENCOUNTER — Ambulatory Visit (INDEPENDENT_AMBULATORY_CARE_PROVIDER_SITE_OTHER): Payer: PPO | Admitting: Endocrinology

## 2016-08-25 VITALS — BP 150/98 | HR 78 | Ht 72.0 in | Wt 251.0 lb

## 2016-08-25 DIAGNOSIS — E063 Autoimmune thyroiditis: Secondary | ICD-10-CM

## 2016-08-25 LAB — T4, FREE: Free T4: 0.84 ng/dL (ref 0.60–1.60)

## 2016-08-25 LAB — TSH: TSH: 5.34 u[IU]/mL — ABNORMAL HIGH (ref 0.35–4.50)

## 2016-08-25 NOTE — Progress Notes (Signed)
Patient ID: Dale Wood, male   DOB: 12-15-1950, 66 y.o.   MRN: [id number]           Referring Physician: Lawerance Cruel  Reason for Appointment:  Hypothyroidism, new visit    History of Present Illness:   Hypothyroidism was first diagnosed in 06/2016  At the time of diagnosis patient was having labs checked as a part of the workup of an episode of generalized shakiness Not clear if he has had thyroid levels done before Patient denies any symptoms of  fatigue, cold sensitivity, difficulty concentrating, dry skin, unusual weight gain and hair loss  He always has occasional mild difficulty with remembering what he is saying in mid sentence, this is not new .         Patient's weight history is as follows:  Wt Readings from Last 3 Encounters:  08/25/16 251 lb (113.9 kg)  07/30/15 245 lb 12.8 oz (111.5 kg)  06/18/15 245 lb (111.1 kg)    Thyroid function results have been as follows:  Lab Results  Component Value Date   TSH 3.86 10/27/2008      Past Medical History:  Diagnosis Date  . Hyperlipidemia   . Hypertension   . Kidney stones   . Low testosterone   . Nephrolithiasis, uric acid   . Rheumatic fever   . Unspecified disorder of kidney and ureter     No past surgical history on file.  Family History  Problem Relation Age of Onset  . Cancer Mother   . Diabetes Mother   . Diabetes Brother   . Cancer Brother        prostate  . Thyroid disease Neg Hx     Social History:  reports that he has never smoked. He has never used smokeless tobacco. He reports that he drinks about 0.6 oz of alcohol per week . His drug history is not on file.  Allergies:  Allergies  Allergen Reactions  . Androgel [Testosterone]   . Penicillins     Allergies as of 08/25/2016      Reactions   Androgel [testosterone]    Penicillins       Medication List    as of 08/25/2016  1:42 PM   You have not been prescribed any medications.        Review of Systems  Constitutional:  Negative for reduced appetite.       Has had mild weight gain this year  HENT: Negative for trouble swallowing.   Eyes: Negative for visual disturbance.  Respiratory: Negative for daytime sleepiness.   Cardiovascular: Negative for leg swelling.  Gastrointestinal: Negative for constipation.  Endocrine: Negative for fatigue and cold intolerance.  Musculoskeletal: Negative for joint pain.  Skin: Negative for dry skin.  Neurological: Negative for weakness.       Had episode of March shakiness with chills in June, no subsequent episodes        About 10 years ago he was diagnosed to have hypogonadism possibly because he was having symptoms of erectile dysfunction.  He says that taking AndroGel caused painful swelling of the right breast and this was stopped No further evaluation has been done recently and no records are available        Examination:    BP (!) 150/98   Pulse 78   Ht 6' (1.829 m)   Wt 251 lb (113.9 kg)   SpO2 96%   BMI 34.04 kg/m   GENERAL:  Relatively large build Has abdominal obesity  No pallor, clubbing, lymphadenopathy or edema.    Skin:  no rash or pigmentation.  EYES:  No prominence of the eyes or swelling of the eyelids  ENT: Oral mucosa and tongue normal.  THYROID:  he has a 3 cm nodule on the right lobe, smooth and firm.   Not palpableon the left .  HEART:  Normal  S1 and S2; no murmur or click.  CHEST:    Lungs: Vescicular breath sounds heard equally.  No crepitations/ wheeze.  ABDOMEN:  No distention.  Liver and spleen not palpable.  No other mass or tenderness.  NEUROLOGICAL: Reflexes are bilaterally  normal at biceps. unable to elicit at ankles  JOINTS:  Normal peripheral joints   Assessment:  HYPOTHYROIDISM, mild and appearing asymptomatic His TSH is about 8 but he has no typical symptoms of hypothyroidism or fatigue Discussed with the patient the usual etiology being autoimmune thyroid disease and chronicity of this   THYROID nodule:  He has a benign thyroid nodule on the right side that was evaluated for years ago His exam indicates that the thyroid nodules about the same size as before Since this has been proven benign he does not need any further evaluation unless it increases in size  PLAN:  Recheck TSH today to confirm that it is abnormal Would consider treatment only if he is symptomatic or the TSH is 10 or above  Continue to follow the thyroid nodule clinically  Follow-up probably in 6 months   Delia Sitar 08/25/2016, 1:42 PM   Consultation note copy sent to the PCP  Note: This office note was prepared with Dragon voice recognition system technology. Any transcriptional errors that result from this process are unintentional.

## 2016-08-25 NOTE — Progress Notes (Signed)
Please call to let patient know that th thyroid level is closer to normal and does not need any treatment.  Follow-up in 6 months

## 2016-11-22 ENCOUNTER — Ambulatory Visit (INDEPENDENT_AMBULATORY_CARE_PROVIDER_SITE_OTHER): Payer: PPO | Admitting: Family Medicine

## 2016-11-22 ENCOUNTER — Encounter: Payer: Self-pay | Admitting: Family Medicine

## 2016-11-22 VITALS — BP 160/90 | HR 76 | Ht 69.5 in | Wt 245.0 lb

## 2016-11-22 DIAGNOSIS — R109 Unspecified abdominal pain: Secondary | ICD-10-CM

## 2016-11-22 DIAGNOSIS — Z13220 Encounter for screening for lipoid disorders: Secondary | ICD-10-CM

## 2016-11-22 DIAGNOSIS — Z23 Encounter for immunization: Secondary | ICD-10-CM | POA: Diagnosis not present

## 2016-11-22 DIAGNOSIS — Z125 Encounter for screening for malignant neoplasm of prostate: Secondary | ICD-10-CM | POA: Diagnosis not present

## 2016-11-22 MED ORDER — ZOSTER VAC RECOMB ADJUVANTED 50 MCG/0.5ML IM SUSR: 0.5000 mL | Freq: Once | INTRAMUSCULAR | 1 refills | Status: AC

## 2016-11-22 NOTE — Progress Notes (Signed)
Subjective:  Dale Wood is a 66 y.o. male who presents today with a chief complaint of LLQ pain and to establish care.   HPI:  LLQ Pain, new problem Started several months ago. Has worsened within the last week. Pain is located in the LLQ of his abdomen. Pain will radiate into his left testicle. He will occasionally feel a bulge in his left groin.  No obvious precipitating events.  He has not noticed any alleviating or aggravating symptoms.  No nausea or vomiting.  No constipation or diarrhea.  No dysuria or penile discharge.  No hematuria.  No urinary frequency.  No fevers or chills.  Occasionally takes ibuprofen which helps some.  ROS: Per HPI, otherwise a 14 point review of systems was performed and was negative  PMH:  The following were reviewed and entered/updated in epic: Past Medical History:  Diagnosis Date  . Heart murmur   . Hyperlipidemia   . Hypertension   . Kidney stones   . Low testosterone   . Nephrolithiasis, uric acid   . Positive TB test   . Rheumatic fever   . Rheumatic fever   . Thyroid disease   . Unspecified disorder of kidney and ureter    Patient Active Problem List   Diagnosis Date Noted  . Headache 07/30/2015  . ANKLE INJURY, RIGHT 09/22/2009  . HYPERLIPIDEMIA 10/27/2008  . ERECTILE DYSFUNCTION, ORGANIC 10/27/2008  . OSTEOARTHRITIS 10/27/2008  . ABDOMINAL PAIN, EPIGASTRIC 10/27/2008  . SKIN CANCER, HX OF 10/27/2008  . NEPHROLITHIASIS, HX OF 10/27/2008  . BASAL CELL CARCINOMA, FACE 10/27/2008   Past Surgical History:  Procedure Laterality Date  . KNEE SURGERY Right    2005, 2008  . TONSILLECTOMY AND ADENOIDECTOMY      Family History  Problem Relation Age of Onset  . Cancer Mother   . Diabetes Mother   . Cancer Father   . Early death Father   . Diabetes Brother   . Cancer Brother        prostate  . Diabetes Brother   . Drug abuse Brother   . Birth defects Sister   . Diabetes Maternal Grandmother   . Thyroid disease Maternal  Grandmother     Medications- reviewed and updated No current outpatient medications on file.   No current facility-administered medications for this visit.    Facility-Administered Medications Ordered in Other Visits  Medication Dose Route Frequency Provider Last Rate Last Dose  . gadopentetate dimeglumine (MAGNEVIST) injection 20 mL  20 mL Intravenous Once PRN Rosalin Hawking, MD        Allergies-reviewed and updated Allergies  Allergen Reactions  . Androgel [Testosterone]     *Pains in Chest/Axillary area*  . Penicillins     *Does Not Work*    Social History   Socioeconomic History  . Marital status: Married    Spouse name: None  . Number of children: None  . Years of education: None  . Highest education level: None  Social Needs  . Financial resource strain: None  . Food insecurity - worry: None  . Food insecurity - inability: None  . Transportation needs - medical: None  . Transportation needs - non-medical: None  Occupational History  . None  Tobacco Use  . Smoking status: Former Research scientist (life sciences)  . Smokeless tobacco: Never Used  Substance and Sexual Activity  . Alcohol use: Yes    Alcohol/week: 0.6 oz    Types: 1 Cans of beer per week    Comment: occssionally  .  Drug use: No  . Sexual activity: Yes  Other Topics Concern  . None  Social History Narrative  . None   Objective:  Physical Exam: BP (!) 160/90   Pulse 76   Ht 5' 9.5" (1.765 m)   Wt 245 lb (111.1 kg)   SpO2 97%   BMI 35.66 kg/m   Gen: NAD, resting comfortably CV: RRR with no murmurs appreciated Pulm: NWOB, CTAB with no crackles, wheezes, or rhonchi GI: Normal bowel sounds present. Soft, Nontender, Nondistended.  No pain with contraction of abdominal musculature. GU: Testes without mass or tenderness.  Given body habitus difficult to palpate inguinal ring, however no appreciable hernia was palpated. MSK: No edema, cyanosis, or clubbing noted Skin: Warm, dry Neuro: Grossly normal, moves all  extremities Psych: Normal affect and thought content  Assessment/Plan:  Left lower quadrant pain, new issue No red flag signs or symptoms.  His history is concerning for hernia.  We will check abdominal ultrasound to rule this out.  Continue Tylenol and/or ibuprofen as needed for pain.  Return precautions reviewed.  If pain persist and ultrasound negative would consider abdominal CT.  Preventative healthcare Flu shot and pneumonia shot given today.  Prescription for shingrix given.  Also check screening labs-lipid panel and PSA.  Instructed patient to follow-up for his annual wellness visit.  He will need colon cancer screening.  [name]. [name], MD 11/23/2016 8:03 AM

## 2016-11-22 NOTE — Patient Instructions (Signed)
We will get an ultrasound to rule out a hernia.  If no answer, we may need a CAT scan.  We will check blood work today.  Come back soon for your annual wellness visit.  Take care,  Dr [name]

## 2016-11-23 ENCOUNTER — Encounter: Payer: Self-pay | Admitting: Family Medicine

## 2016-11-23 LAB — CBC
HCT: 44.4 % (ref 39.0–52.0)
Hemoglobin: 14.5 g/dL (ref 13.0–17.0)
MCHC: 32.6 g/dL (ref 30.0–36.0)
MCV: 82.9 fl (ref 78.0–100.0)
Platelets: 283 10*3/uL (ref 150.0–400.0)
RBC: 5.36 Mil/uL (ref 4.22–5.81)
RDW: 17.1 % — ABNORMAL HIGH (ref 11.5–15.5)
WBC: 6.5 10*3/uL (ref 4.0–10.5)

## 2016-11-23 LAB — COMPREHENSIVE METABOLIC PANEL
ALT: 16 U/L (ref 0–53)
AST: 17 U/L (ref 0–37)
Albumin: 4 g/dL (ref 3.5–5.2)
Alkaline Phosphatase: 90 U/L (ref 39–117)
BUN: 19 mg/dL (ref 6–23)
CO2: 30 mEq/L (ref 19–32)
Calcium: 9.4 mg/dL (ref 8.4–10.5)
Chloride: 105 mEq/L (ref 96–112)
Creatinine, Ser: 1.22 mg/dL (ref 0.40–1.50)
GFR: 63.13 mL/min (ref >60.00)
Glucose, Bld: 106 mg/dL — ABNORMAL HIGH (ref 70–99)
Potassium: 4.1 mEq/L (ref 3.5–5.1)
Sodium: 142 mEq/L (ref 135–145)
Total Bilirubin: 0.4 mg/dL (ref 0.2–1.2)
Total Protein: 6.8 g/dL (ref 6.0–8.3)

## 2016-11-23 LAB — PSA: PSA: 2.35 ng/mL (ref 0.10–4.00)

## 2016-11-23 LAB — LIPID PANEL
Cholesterol: 213 mg/dL — ABNORMAL HIGH (ref 0–200)
HDL: 31.1 mg/dL — ABNORMAL LOW (ref >39.00)
Total CHOL/HDL Ratio: 7
Triglycerides: 427 mg/dL — ABNORMAL HIGH (ref 0.0–149.0)

## 2016-11-23 LAB — LDL CHOLESTEROL, DIRECT: Direct LDL: 116 mg/dL

## 2016-11-25 ENCOUNTER — Ambulatory Visit
Admission: RE | Admit: 2016-11-25 | Discharge: 2016-11-25 | Disposition: A | Discharge status: Home / Self Care | Payer: PPO | Source: Ambulatory Visit | Attending: Family Medicine | Admitting: Family Medicine

## 2016-11-25 DIAGNOSIS — R109 Unspecified abdominal pain: Secondary | ICD-10-CM | POA: Diagnosis not present

## 2016-11-28 ENCOUNTER — Telehealth: Payer: Self-pay | Admitting: Family Medicine

## 2016-11-28 DIAGNOSIS — R109 Unspecified abdominal pain: Secondary | ICD-10-CM

## 2016-11-28 NOTE — Telephone Encounter (Signed)
Please call RE [place] results.  Ty,  -LL

## 2016-11-29 NOTE — Telephone Encounter (Signed)
Pt would like to proceed with CT scan. Please advise specifics for the scan. Thanks.

## 2016-11-29 NOTE — Telephone Encounter (Signed)
Order as been placed.  [name]. [name], MD 11/29/2016 8:08 AM

## 2016-12-05 ENCOUNTER — Ambulatory Visit (INDEPENDENT_AMBULATORY_CARE_PROVIDER_SITE_OTHER)
Admission: RE | Admit: 2016-12-05 | Discharge: 2016-12-05 | Disposition: A | Discharge status: Home / Self Care | Payer: PPO | Source: Ambulatory Visit | Attending: Family Medicine | Admitting: Family Medicine

## 2016-12-05 DIAGNOSIS — R109 Unspecified abdominal pain: Secondary | ICD-10-CM

## 2016-12-05 DIAGNOSIS — K8020 Calculus of gallbladder without cholecystitis without obstruction: Secondary | ICD-10-CM | POA: Diagnosis not present

## 2016-12-05 MED ORDER — IOPAMIDOL (ISOVUE-300) INJECTION 61%
100.0000 mL | Freq: Once | INTRAVENOUS | Status: AC | PRN
  Administered 2016-12-05: 100 mL via INTRAVENOUS

## 2016-12-06 ENCOUNTER — Telehealth: Payer: Self-pay | Admitting: Family Medicine

## 2016-12-06 NOTE — Telephone Encounter (Signed)
Calling for CT results

## 2016-12-07 ENCOUNTER — Other Ambulatory Visit: Payer: Self-pay | Admitting: Family Medicine

## 2016-12-07 MED ORDER — DOCUSATE SODIUM 100 MG PO CAPS: 200.0000 mg | ORAL_CAPSULE | Freq: Two times a day (BID) | ORAL | 5 refills | Status: DC

## 2016-12-07 MED ORDER — POLYETHYLENE GLYCOL 3350 17 GM/SCOOP PO POWD: 17.0000 g | Freq: Two times a day (BID) | ORAL | 1 refills | Status: DC | PRN

## 2016-12-07 NOTE — Telephone Encounter (Signed)
See lab results.  

## 2016-12-13 ENCOUNTER — Ambulatory Visit: Payer: PPO | Admitting: Family Medicine

## 2016-12-21 ENCOUNTER — Other Ambulatory Visit: Payer: Self-pay | Admitting: Dermatology

## 2016-12-21 ENCOUNTER — Encounter: Payer: Self-pay | Admitting: Family Medicine

## 2016-12-21 DIAGNOSIS — B079 Viral wart, unspecified: Secondary | ICD-10-CM | POA: Diagnosis not present

## 2016-12-21 DIAGNOSIS — L281 Prurigo nodularis: Secondary | ICD-10-CM | POA: Diagnosis not present

## 2016-12-21 DIAGNOSIS — D492 Neoplasm of unspecified behavior of bone, soft tissue, and skin: Secondary | ICD-10-CM | POA: Diagnosis not present

## 2016-12-21 DIAGNOSIS — L570 Actinic keratosis: Secondary | ICD-10-CM | POA: Diagnosis not present

## 2017-01-05 ENCOUNTER — Encounter: Payer: Self-pay | Admitting: Family Medicine

## 2017-01-05 ENCOUNTER — Ambulatory Visit (INDEPENDENT_AMBULATORY_CARE_PROVIDER_SITE_OTHER): Payer: PPO | Admitting: Family Medicine

## 2017-01-05 VITALS — BP 138/88 | HR 73 | Temp 97.8°F | Resp 16 | Ht 71.0 in | Wt 246.2 lb

## 2017-01-05 DIAGNOSIS — E785 Hyperlipidemia, unspecified: Secondary | ICD-10-CM | POA: Diagnosis not present

## 2017-01-05 DIAGNOSIS — R030 Elevated blood-pressure reading, without diagnosis of hypertension: Secondary | ICD-10-CM | POA: Diagnosis not present

## 2017-01-05 DIAGNOSIS — K5900 Constipation, unspecified: Secondary | ICD-10-CM | POA: Insufficient documentation

## 2017-01-05 DIAGNOSIS — I10 Essential (primary) hypertension: Secondary | ICD-10-CM | POA: Insufficient documentation

## 2017-01-05 HISTORY — DX: Constipation, unspecified: K59.00

## 2017-01-05 MED ORDER — ATORVASTATIN CALCIUM 40 MG PO TABS: 40.0000 mg | ORAL_TABLET | Freq: Every day | ORAL | 3 refills | Status: DC

## 2017-01-05 NOTE — Assessment & Plan Note (Signed)
Patient agreeable to start statin.  Prescription for atorvastatin 40 mg daily sent in.  Repeat lipid panel or at least direct LDL in the next 6 months.

## 2017-01-05 NOTE — Progress Notes (Signed)
    Subjective:  Dale Wood is a 66 y.o. male who presents today with a chief complaint of abdominal pain follow-up.   HPI:  Abdominal Pain, established problem, improving Patient seen approximately 6 weeks ago for left lower quadrant abdominal pain that radiated to his left groin.  At this time, his symptoms were thought to be most likely related to a hernia.  He underwent workup including ultrasound and abdominal CT which were largely negative.  Of note he did have a moderate amount of constipation on his abdominal CT patient was instructed to start a bowel regimen consisting of Colace and MiraLAX to help alleviate this.  Patient states that he was on MiraLAX for about 7 days however has not been using it consistently.  He has noticed a significant improvement in his bowel habits and symptoms.  He still only has a bowel movement about every other day however notices that his bowel movements are much softer than previously.  Abdominal pain is a little bit better than previously.  Hyperlipidemia, new issue Noted on blood work performed about 6 weeks ago.  Patient is agreeable to start statin therapy today.  Elevated blood pressure reading, established problem, improving Patient endorses significant salt intake.  He has not tried any sort of diet to help with his blood pressure.  Does not regularly exercise.  No chest pain or shortness of breath.  ROS: Per HPI  PMH: Smoking history reviewed.  Former smoker.  Objective:  Physical Exam: BP 138/88   Pulse 73   Temp 97.8 F (36.6 C) (Oral)   Resp 16   Ht 5\' 11"  (1.803 m)   Wt 246 lb 3.2 oz (111.7 kg)   SpO2 96%   BMI 34.34 kg/m   Gen: NAD, resting comfortably CV: RRR with no murmurs appreciated Pulm: NWOB, CTAB with no crackles, wheezes, or rhonchi GI: Obese, normal bowel sounds present. Soft, Nontender, Nondistended.  Assessment/Plan:  Constipation Continue bowel regimen of Colace and MiraLAX.  Instructed patient to titrate  MiraLAX as needed to have 1-2 soft bowel movements daily.  Hyperlipidemia Patient agreeable to start statin.  Prescription for atorvastatin 40 mg daily sent in.  Repeat lipid panel or at least direct LDL in the next 6 months.  Elevated blood pressure reading At goal per JNC 8 guidelines today.  Discussed ways to help lower his blood pressure including low-salt diet and regular exercise.  [name]. [name], MD 01/05/2017 5:31 PM

## 2017-01-05 NOTE — Patient Instructions (Signed)
Start the lipitor.  Please cut back on the salt.   Continue the miralax.   Come back to see me in about 6 months, or sooner as needed.  Take care, Dr [name]

## 2017-01-05 NOTE — Assessment & Plan Note (Signed)
Continue bowel regimen of Colace and MiraLAX.  Instructed patient to titrate MiraLAX as needed to have 1-2 soft bowel movements daily.

## 2017-01-11 ENCOUNTER — Telehealth: Payer: Self-pay | Admitting: Family Medicine

## 2017-01-11 MED ORDER — ATORVASTATIN CALCIUM 40 MG PO TABS: 40.0000 mg | ORAL_TABLET | Freq: Every day | ORAL | 3 refills | Status: DC

## 2017-01-11 NOTE — Telephone Encounter (Signed)
Copied from [place]. Topic: Quick Communication - See Telephone Encounter >> Jan 11, 2017  2:16 PM Percell Belt A wrote: CRM for notification. See Telephone encounter for:   atorvastatin (LIPITOR) 40 MG tablet [[id number] was sent to wrong pharmacy, can this be resent to [facility] on [address]    01/11/17.

## 2017-01-11 NOTE — Telephone Encounter (Signed)
Pharmacy changed

## 2017-01-24 DIAGNOSIS — B029 Zoster without complications: Secondary | ICD-10-CM | POA: Diagnosis not present

## 2017-02-15 ENCOUNTER — Inpatient Hospital Stay: Payer: PPO | Admitting: Family Medicine

## 2017-02-17 ENCOUNTER — Ambulatory Visit (INDEPENDENT_AMBULATORY_CARE_PROVIDER_SITE_OTHER): Payer: PPO | Admitting: Family Medicine

## 2017-02-17 ENCOUNTER — Encounter: Payer: Self-pay | Admitting: Family Medicine

## 2017-02-17 VITALS — BP 138/82 | HR 70 | Temp 97.6°F | Ht 71.0 in | Wt 251.2 lb

## 2017-02-17 DIAGNOSIS — B029 Zoster without complications: Secondary | ICD-10-CM | POA: Diagnosis not present

## 2017-02-17 DIAGNOSIS — K5900 Constipation, unspecified: Secondary | ICD-10-CM | POA: Diagnosis not present

## 2017-02-17 MED ORDER — ATORVASTATIN CALCIUM 40 MG PO TABS: 40.0000 mg | ORAL_TABLET | Freq: Every day | ORAL | 3 refills | Status: DC

## 2017-02-17 NOTE — Progress Notes (Signed)
    Subjective:  Dale Wood is a 67 y.o. male who presents today with a chief complaint of shingles.   HPI:  Shingles, new issue Patient with shingles outbreak about 4 weeks ago.  Was seen at urgent care.  Started on antivirals-does not remember the name.  Symptoms are improving with the medication.  Still has a few small blisters.  Decreased sensation of the area.  No obvious precipitating events.  No fevers or chills.  No surrounding erythema.  Constipation, established problem, improving Patient seen about 6 weeks ago for this.  Started on MiraLAX and Colace daily.  Patient still with bowel movement every other day.  Occasionally hard stools that are difficult to pass.  Is not using MiraLAX or Colace daily.  No obvious alleviating or aggravating factors.   ROS: Per HPI  PMH: He reports that he has quit smoking. he has never used smokeless tobacco. He reports that he drinks about 0.6 oz of alcohol per week. He reports that he does not use drugs.   Objective:  Physical Exam: BP 138/82 (BP Location: Left Arm, Patient Position: Sitting, Cuff Size: Normal)   Pulse 70   Temp 97.6 F (36.4 C) (Oral)   Ht 5\' 11"  (1.803 m)   Wt 251 lb 3.2 oz (113.9 kg)   SpO2 97%   BMI 35.04 kg/m   Gen: NAD, resting comfortably Skin: 2 small scabbed over vesicles on left flank.  Assessment/Plan:  No problem-specific Assessment & Plan notes found for this encounter.  Shingles Seems to be resolving.  Still has a little bit of postherpetic neuralgia to the area.  Advised patient that this should hopefully improve over time.  Symptoms are not particularly bothersome to patient-do not need to start any treatment for this at this point.  Advised him that he still needs to get his shingrix vaccine.  Return precautions reviewed.  Follow-up as needed.  [name]. [name], MD 02/17/2017 4:18 PM

## 2017-02-17 NOTE — Assessment & Plan Note (Signed)
Reeducated on proper use of MiraLAX and Colace.  Advised patient to titrate as needed to have 1-2 soft bowel movements daily.

## 2017-02-21 ENCOUNTER — Other Ambulatory Visit: Payer: PPO

## 2017-02-21 ENCOUNTER — Other Ambulatory Visit: Payer: Self-pay | Admitting: Endocrinology

## 2017-02-21 DIAGNOSIS — E041 Nontoxic single thyroid nodule: Secondary | ICD-10-CM

## 2017-02-24 ENCOUNTER — Ambulatory Visit: Payer: PPO | Admitting: Endocrinology

## 2017-03-08 ENCOUNTER — Telehealth: Payer: Self-pay | Admitting: Family Medicine

## 2017-03-08 NOTE — Telephone Encounter (Signed)
See note

## 2017-03-08 NOTE — Telephone Encounter (Signed)
Copied from [place] [id number]. Topic: Quick Communication - See Telephone Encounter >> Mar 08, 2017  3:43 PM Percell Belt A wrote: CRM for notification. See Telephone encounter for:  pt called in stated that he thinks his shingles have come back.  He would like to know if they can just call in a script for it or he will have to make another appt.  Pt was seen back in Jan .  He would rather not come back in if he doesn't have too?    Best number [phone]    03/08/17.

## 2017-03-09 ENCOUNTER — Other Ambulatory Visit: Payer: Self-pay

## 2017-03-09 DIAGNOSIS — B029 Zoster without complications: Secondary | ICD-10-CM

## 2017-03-09 MED ORDER — VALACYCLOVIR HCL 1 G PO TABS: 1000.0000 mg | ORAL_TABLET | Freq: Three times a day (TID) | ORAL | 0 refills | Status: AC

## 2017-03-09 NOTE — Telephone Encounter (Signed)
Rx has been sent to patient's pharmacy.  Spoke with patient's wife (on [place]) and she is aware.

## 2017-03-09 NOTE — Telephone Encounter (Signed)
[place] with me. Please send in valtrex 1000mg  tid for 7 days.   [name]. [name], MD 03/09/2017 8:18 AM

## 2017-03-09 NOTE — Telephone Encounter (Signed)
Please advise 

## 2017-03-27 ENCOUNTER — Encounter: Payer: Self-pay | Admitting: Family Medicine

## 2017-03-27 ENCOUNTER — Ambulatory Visit (INDEPENDENT_AMBULATORY_CARE_PROVIDER_SITE_OTHER): Payer: PPO | Admitting: Family Medicine

## 2017-03-27 DIAGNOSIS — N529 Male erectile dysfunction, unspecified: Secondary | ICD-10-CM | POA: Diagnosis not present

## 2017-03-27 DIAGNOSIS — B0229 Other postherpetic nervous system involvement: Secondary | ICD-10-CM | POA: Insufficient documentation

## 2017-03-27 HISTORY — DX: Other postherpetic nervous system involvement: B02.29

## 2017-03-27 MED ORDER — SILDENAFIL CITRATE 20 MG PO TABS: 20.0000 mg | ORAL_TABLET | Freq: Every day | ORAL | 3 refills | Status: DC | PRN

## 2017-03-27 NOTE — Assessment & Plan Note (Signed)
Most likely the source of patient's left flank and abdominal pain.  He does not have any red flag signs or symptoms today.  His neurological exam is normal.  Discussed natural course with patient and it may take several weeks for this to resolve.  Discussed that there may be a possibility that it never completely resolves.  Deferred starting medications today.

## 2017-03-27 NOTE — Assessment & Plan Note (Signed)
Stable.  Refilled sildenafil. 

## 2017-03-27 NOTE — Patient Instructions (Signed)
Postherpetic Neuralgia Postherpetic neuralgia (PHN) is nerve pain that occurs after a shingles infection. Shingles is a painful rash that appears on one side of the body, usually on your trunk or face. Shingles is caused by the varicella-zoster virus. This is the same virus that causes chickenpox. In people who have had chickenpox, the virus can resurface years later and cause shingles. You may have PHN if you continue to have pain for 3 months after your shingles rash has gone away. PHN appears in the same area where you had the shingles rash. For most people, PHN goes away within 1 year. Getting a vaccination for shingles can prevent PHN. This vaccine is recommended for people older than 50. It may prevent shingles and may also lower your risk of PHN if you do get shingles. What are the causes? PHN is caused by damage to your nerves from the varicella-zoster virus. This damage makes your nerves overly sensitive. What increases the risk? Aging is the biggest risk factor for developing PHN. Most people who get PHN are older than 60. Other risk factors include:  Having very bad pain before your shingles rash starts.  Having a very bad rash.  Having shingles in the nerve that supplies your face and eye (trigeminal nerve).  What are the signs or symptoms? Pain is the main symptom of PHN. The pain is often very bad and may be described as stabbing, burning, or feeling like an electric shock. The pain may come and go or may be there all the time. Pain may be triggered by light touches on the skin or changes in temperature. You may have itching along with the pain. How is this diagnosed? Your health care provider may diagnose PHN based on your symptoms and your history of shingles. Lab studies and other diagnostic tests are usually not needed. How is this treated? There is no cure for PHN. Treatment for PHN will focus on pain relief. Over-the-counter pain relievers do not usually relieve PHN pain. You  may need to work with a pain specialist. Treatment may include:  Antidepressant medicines to help with pain and improve sleep.  Antiseizure medicines to relieve nerve pain.  Strong pain relievers (opioids).  A numbing patch worn on the skin (lidocaine patch).  Follow these instructions at home: It may take a long time to recover from PHN. Work closely with your health care provider, and have a good support system at home.  Take all medicines as directed by your health care provider.  Wear loose, comfortable clothing.  Cover sensitive areas with a dressing to reduce friction from clothing rubbing on the area.  If cold does not make your pain worse, try applying a cool compress or cooling gel pack to the area.  Talk to your health care provider if you feel depressed or desperate. Living with long-term pain can be depressing.  Contact a health care provider if:  Your medicine is not helping.  You are struggling to manage your pain at home. This information is not intended to replace advice given to you by your health care provider. Make sure you discuss any questions you have with your health care provider. Document Released: 03/26/2002 Document Revised: 06/11/2015 Document Reviewed: 12/25/2012 Elsevier Interactive Patient Education  2018 Elsevier Inc.  

## 2017-03-27 NOTE — Progress Notes (Signed)
   Subjective:  Dale Wood is a 67 y.o. male who presents today for same-day appointment with a chief complaint of left flank pain.   HPI:  Left flank pain, new issue Symptoms started a few months ago after a bout of shingles.  He was treated with antivirals for his shingles flares.  Pain is described as a sharp, hot sensation that will last for a few moments.  Feels like it goes from the front to the back.  Pain will only last for a few moments and then subside.  Pain is occasionally triggered by light touch to the area.  No nausea or vomiting.  No constipation or diarrhea.  No fevers or chills.  Has not tried any medications for this.  Symptoms seem to be improving over the last several weeks.  Erectile dysfunction, chronic problem, new to this provider Several year history.  Has been controlled on sildenafil by his previous PCP.  This was last prescribed 2 or 3 years ago.  No side effects from this medication.  No dysuria.  No penile discharge.  Normal libido.  ROS: Per HPI  PMH: He reports that he has quit smoking. he has never used smokeless tobacco. He reports that he drinks about 0.6 oz of alcohol per week. He reports that he does not use drugs.  Objective:  Physical Exam: BP 136/84 (BP Location: Right Arm, Patient Position: Sitting, Cuff Size: Normal)   Pulse 78   Temp 97.6 F (36.4 C) (Oral)   Ht 5\' 11"  (1.803 m)   Wt 254 lb 9.6 oz (115.5 kg)   SpO2 95%   BMI 35.51 kg/m   Gen: NAD, resting comfortably Skin: 2 well healed areas on left flank consistent with areas of prior shingles outbreak. Neuro: Cranial nerves II through XII intact.  Strength 5 out of 5 in upper and lower extremities.  Sensation light touch intact throughout.  Biceps reflexes 2+ and symmetric bilaterally.  Patellar reflexes 2+ and symmetric bilaterally.  Assessment/Plan:  Postherpetic neuralgia Most likely the source of patient's left flank and abdominal pain.  He does not have any red flag signs or  symptoms today.  His neurological exam is normal.  Discussed natural course with patient and it may take several weeks for this to resolve.  Discussed that there may be a possibility that it never completely resolves.  Deferred starting medications today.  ERECTILE DYSFUNCTION, ORGANIC Stable.  Refilled sildenafil.  [name]. [name], MD 03/27/2017 4:59 PM

## 2017-03-29 DIAGNOSIS — M1712 Unilateral primary osteoarthritis, left knee: Secondary | ICD-10-CM | POA: Diagnosis not present

## 2017-03-29 DIAGNOSIS — M170 Bilateral primary osteoarthritis of knee: Secondary | ICD-10-CM | POA: Diagnosis not present

## 2017-03-29 DIAGNOSIS — M1711 Unilateral primary osteoarthritis, right knee: Secondary | ICD-10-CM | POA: Diagnosis not present

## 2017-03-30 ENCOUNTER — Other Ambulatory Visit: Payer: Self-pay

## 2017-04-12 ENCOUNTER — Telehealth: Payer: Self-pay | Admitting: Family Medicine

## 2017-04-12 NOTE — Telephone Encounter (Signed)
See note.   Copied from [place]. Topic: General - Call Back - No Documentation >> Apr 12, 2017  3:42 PM Conception Chancy, NT wrote: Patient is calling and states he missed a call from the office. No documentation. Please advise and contact pt.

## 2017-04-13 NOTE — Telephone Encounter (Signed)
We did not call this patient  

## 2017-05-16 DIAGNOSIS — M1711 Unilateral primary osteoarthritis, right knee: Secondary | ICD-10-CM | POA: Diagnosis not present

## 2017-05-23 DIAGNOSIS — M1711 Unilateral primary osteoarthritis, right knee: Secondary | ICD-10-CM | POA: Diagnosis not present

## 2017-05-23 DIAGNOSIS — M25561 Pain in right knee: Secondary | ICD-10-CM | POA: Diagnosis not present

## 2017-05-31 DIAGNOSIS — M25561 Pain in right knee: Secondary | ICD-10-CM | POA: Diagnosis not present

## 2017-08-15 ENCOUNTER — Ambulatory Visit: Payer: Self-pay | Admitting: *Deleted

## 2017-08-15 NOTE — Telephone Encounter (Signed)
Pt reports H/O gall stones. Reports right sided abdominal pain, radiates to lower back at times "Burning sensation." Onset Sunday AM. States pain is intermittent, mild "Discomfort" with "moderate bloating." Denies any nausea, vomiting, diarrhea, fever. States has had kidney stones in past "Doesn't feel like that." Denies dysuria, no hematuria. States has taken Ibuprofen. which has helped. Pt states he is to travel to [place] for 3 days, leaving tomorrow. Pt is asking if Dr. [name] thinks that is appropriate and what he should watch for if he does travel. Please advise: [phone] Reason for Disposition . Age > 60 years  Answer Assessment - Initial Assessment Questions 1. LOCATION: "Where does it hurt?"     Right sided, lower abdomen 2. RADIATION: "Does the pain shoot anywhere else?" (e.g., chest, back)    Sometimes to lower back 3. ONSET: "When did the pain begin?" (Minutes, hours or days ago)      Sunday am 08/13/17 4. SUDDEN: "Gradual or sudden onset?"     Gradual 5. PATTERN "Does the pain come and go, or is it constant?"    - If constant: "Is it getting better, staying the same, or worsening?"      (Note: Constant means the pain never goes away completely; most serious pain is constant and it progresses)     - If intermittent: "How long does it last?" "Do you have pain now?"     (Note: Intermittent means the pain goes away completely between bouts)     Intermittent, varies 6. SEVERITY: "How bad is the pain?"  (e.g., Scale 1-10; mild, moderate, or severe)    - MILD (1-3): doesn't interfere with normal activities, abdomen soft and not tender to touch     - MODERATE (4-7): interferes with normal activities or awakens from sleep, tender to touch     - SEVERE (8-10): excruciating pain, doubled over, unable to do any normal activities       Mild to moderate with bloating, "Burning sensation" to back is moderate at times 7. RECURRENT SYMPTOM: "Have you ever had this type of abdominal pain before?"  If so, ask: "When was the last time?" and "What happened that time?"      Yes, gall stones 8. CAUSE: "What do you think is causing the abdominal pain?"     Gall stones 9. RELIEVING/AGGRAVATING FACTORS: "What makes it better or worse?" (e.g., movement, antacids, bowel movement)     IBU helps 10. OTHER SYMPTOMS: "Has there been any vomiting, diarrhea, constipation, or urine problems?"       None, heartburn at times  Protocols used: ABDOMINAL PAIN - MALE-A-AH

## 2017-08-15 NOTE — Telephone Encounter (Signed)
See note

## 2017-08-15 NOTE — Telephone Encounter (Signed)
Please advise 

## 2017-08-15 NOTE — Telephone Encounter (Signed)
Hard to say much without evaluating him in person. Would ideally like office appointment.  He should watch out for any worsening fevers, chills, nausea, vomiting, diarrhea, constipation, or worsening abdominal pain.

## 2017-08-16 NOTE — Telephone Encounter (Signed)
Spoke with patient on 08/15/2017 and gave Dr. Marigene Ehlers recommendations.  Patient verbalized understanding and was scheduled for an appointment when he returns from his trip.

## 2017-08-21 ENCOUNTER — Ambulatory Visit (INDEPENDENT_AMBULATORY_CARE_PROVIDER_SITE_OTHER): Payer: PPO | Admitting: Family Medicine

## 2017-08-21 ENCOUNTER — Encounter: Payer: Self-pay | Admitting: Family Medicine

## 2017-08-21 VITALS — BP 148/84 | HR 73 | Temp 98.2°F | Ht 71.0 in | Wt 252.2 lb

## 2017-08-21 DIAGNOSIS — R350 Frequency of micturition: Secondary | ICD-10-CM

## 2017-08-21 DIAGNOSIS — M545 Low back pain, unspecified: Secondary | ICD-10-CM

## 2017-08-21 LAB — POCT URINALYSIS DIPSTICK
Bilirubin, UA: NEGATIVE
Blood, UA: NEGATIVE
Glucose, UA: NEGATIVE
Ketones, UA: NEGATIVE
Nitrite, UA: NEGATIVE
Protein, UA: NEGATIVE
Spec Grav, UA: 1.025 (ref 1.010–1.025)
Urobilinogen, UA: 2 E.U./dL — AB
pH, UA: 6 (ref 5.0–8.0)

## 2017-08-21 MED ORDER — DICLOFENAC SODIUM 75 MG PO TBEC: 75.0000 mg | DELAYED_RELEASE_TABLET | Freq: Two times a day (BID) | ORAL | 0 refills | Status: DC

## 2017-08-21 MED ORDER — TAMSULOSIN HCL 0.4 MG PO CAPS: 0.4000 mg | ORAL_CAPSULE | Freq: Every day | ORAL | 3 refills | Status: DC

## 2017-08-21 MED ORDER — SILDENAFIL CITRATE 20 MG PO TABS: 20.0000 mg | ORAL_TABLET | Freq: Every day | ORAL | 3 refills | Status: DC | PRN

## 2017-08-21 NOTE — Patient Instructions (Addendum)
It was very nice to see you today!  Please start the flomax and diclofenac.  Please let me know if your symptoms are not improving over the next few days.   Take care, Dr [name]

## 2017-08-21 NOTE — Progress Notes (Signed)
   Subjective:  Dale Wood is a 67 y.o. male who presents today for same-day appointment with a chief complaint of abdominal pain.   HPI:  Abdominal Pain, Acute problem Started about a week ago. Located in his right lower abdomen and right back.  Pain waxes and wanes, but is nearly constant in nature.  Typically mild, however can become very severe.  Pain is as severe as prior kidney stones.  Tried Tylenol which helped a little bit.  No hematuria.  He has had some constipation.  Not associated with eating.  No other obvious alleviating or aggravating factors.  ROS: Per HPI  PMH: He reports that he has quit smoking. He has never used smokeless tobacco. He reports that he drinks about 0.6 oz of alcohol per week. He reports that he does not use drugs.  Objective:  Physical Exam: BP (!) 148/84 (BP Location: Left Arm, Patient Position: Sitting, Cuff Size: Normal)   Pulse 73   Temp 98.2 F (36.8 C) (Oral)   Ht 5\' 11"  (1.803 m)   Wt 252 lb 3.2 oz (114.4 kg)   SpO2 95%   BMI 35.17 kg/m   Gen: NAD, resting comfortably CV: RRR with no murmurs appreciated Pulm: NWOB, CTAB with no crackles, wheezes, or rhonchi GI: Obese.  Umbilical hernia noted.  Normal bowel sounds present.  Tender palpation right lower quadrant.  No rebound or guarding. MSK:  -Back: No deformities.  Tender palpation along right lower back.  Mild right-sided CVA tenderness.  Results for orders placed or performed in visit on 08/21/17 (from the past 24 hour(s))  POCT urinalysis dipstick     Status: Abnormal   Collection Time: 08/21/17  4:18 PM  Result Value Ref Range   Color, UA Yellow    Clarity, UA Clear    Glucose, UA Negative Negative   Bilirubin, UA Negative    Ketones, UA Negative    Spec Grav, UA 1.025 1.010 - 1.025   Blood, UA Negative    pH, UA 6.0 5.0 - 8.0   Protein, UA Negative Negative   Urobilinogen, UA 2.0 (A) 0.2 or 1.0 E.U./dL   Nitrite, UA Negative    Leukocytes, UA Small (1+) (A) Negative   Appearance     Odor      Assessment/Plan:  Abdominal Pain UA is equivocal, however his history is consistent with prior episodes of nephrolithiasis.  Patient is concerned about cholelithiasis, however his history and exam are not consistent with this. Constipation possible also playing a role. We will start empiric Flomax and diclofenac today.  He does not have any signs of systemic illness today.  Discussed reasons to return to care.  If no improvement in the next 2 to 3 days, would consider abdominal CT to rule out hydronephrosis and to further evaluate for other possible causes of abdominal pain.    [name]. [name], MD 08/21/2017 4:35 PM

## 2017-08-28 ENCOUNTER — Telehealth: Payer: Self-pay | Admitting: Family Medicine

## 2017-08-28 NOTE — Telephone Encounter (Signed)
Copied from [place] [id number]. Topic: Quick Communication - See Telephone Encounter >> Aug 28, 2017  4:07 PM Rutherford Nail, NT wrote: CRM for notification. See Telephone encounter for: 08/28/17. Patient calling and states that Dr [name] told him to call back if the pain in his lower back kidney area was to get worse. Patient states that the pain hasn't completely gone away. States that he would like to get the process started to get a cat scan that Dr [name] had mentioned at last visit. Please advise. CB#: [phone]

## 2017-08-28 NOTE — Telephone Encounter (Signed)
See note

## 2017-08-29 ENCOUNTER — Other Ambulatory Visit: Payer: Self-pay

## 2017-08-29 DIAGNOSIS — R1031 Right lower quadrant pain: Secondary | ICD-10-CM

## 2017-08-29 NOTE — Telephone Encounter (Signed)
Order for CT has been placed 

## 2017-08-29 NOTE — Telephone Encounter (Signed)
Ok to order a noncontrast abdominal CT for renal colic to rule out nephrolithiasis.  [name]. [name], MD 08/29/2017 8:32 AM

## 2017-08-29 NOTE — Telephone Encounter (Signed)
Please advise 

## 2017-08-29 NOTE — Telephone Encounter (Signed)
CT has been scheduled 

## 2017-08-30 ENCOUNTER — Encounter (INDEPENDENT_AMBULATORY_CARE_PROVIDER_SITE_OTHER): Payer: Self-pay

## 2017-08-30 ENCOUNTER — Ambulatory Visit (INDEPENDENT_AMBULATORY_CARE_PROVIDER_SITE_OTHER)
Admission: RE | Admit: 2017-08-30 | Discharge: 2017-08-30 | Disposition: A | Discharge status: Home / Self Care | Payer: PPO | Source: Ambulatory Visit | Attending: Family Medicine | Admitting: Family Medicine

## 2017-08-30 DIAGNOSIS — R1031 Right lower quadrant pain: Secondary | ICD-10-CM | POA: Diagnosis not present

## 2017-08-30 DIAGNOSIS — K8020 Calculus of gallbladder without cholecystitis without obstruction: Secondary | ICD-10-CM | POA: Diagnosis not present

## 2017-08-30 DIAGNOSIS — N200 Calculus of kidney: Secondary | ICD-10-CM | POA: Diagnosis not present

## 2017-08-31 ENCOUNTER — Other Ambulatory Visit: Payer: Self-pay

## 2017-08-31 ENCOUNTER — Ambulatory Visit: Payer: PPO

## 2017-08-31 DIAGNOSIS — N200 Calculus of kidney: Secondary | ICD-10-CM

## 2017-08-31 NOTE — Progress Notes (Signed)
Please inform patient of the following:  Please let patient know that he has a tiny right sided kidney stone. I would like for him to see urology since it does not seem like this will pass on its on. Please place a referral if needed.  He also has some gallstones that are probably NOT causing his symptoms.  [name]. [name], MD 08/31/2017 8:11 AM

## 2017-09-01 ENCOUNTER — Telehealth: Payer: Self-pay

## 2017-09-01 DIAGNOSIS — N5201 Erectile dysfunction due to arterial insufficiency: Secondary | ICD-10-CM | POA: Diagnosis not present

## 2017-09-01 DIAGNOSIS — N200 Calculus of kidney: Secondary | ICD-10-CM | POA: Diagnosis not present

## 2017-09-01 NOTE — Telephone Encounter (Signed)
Copied from [place] [id number]. Topic: General - Other >> Sep 01, 2017 10:10 AM Yvette Rack wrote: Reason for CRM: Pt states his wife received a message regarding a scan and he would like Dr. Marigene Ehlers nurse to call him back. Cb# [phone]

## 2017-09-04 NOTE — Telephone Encounter (Signed)
LM for patient to return call.  CRM started. 

## 2017-09-04 NOTE — Telephone Encounter (Signed)
Spoke with patient.  He was upset because he saw urology and he states they treated him as if his situation was not urgent and that the stone would pass on its own.  He wanted to know why we sent him there.  I explained that the referral was made based on the radiology report and Fr. Parker's instructions.  I apologized that the patient felt he had a negative experience at the urologists office.  I urged him to contact our office if his pain worsens.  Patient verbalized understanding.

## 2017-09-06 NOTE — Telephone Encounter (Signed)
Patient requests to get a call tomorrow morning 09-07-17 with an update on his appointment he had from the scan and his [facility] appointment as he has gotten multiple diagnosis. Call his wife at [phone] to leave a detailed message if she does not answer to explain all results and concerns.

## 2017-09-07 NOTE — Telephone Encounter (Signed)
His history is consistent with symptomatic nephrolithiasis. He has a kidney stone on his CT scan in the area that is causing his pain. The most likely explanation for his pain is his kidney stone.   He does have some gallstones but his history is not consistent with pain from gallstones. He has mild to moderate arthritis in his back which could be contributing, but would be fairly unusual for this to cause his symptoms.  We have a few options. 1) Seek a second opinion from another urologist about his kidney stone. 2) Get him in to see Dr Paulla Fore to work on his back.   Dale Wood. [name], MD 09/07/2017 8:24 AM

## 2017-09-07 NOTE — Telephone Encounter (Signed)
Spoke with patient's wife and relayed all information.  Also offered patient 10:40 am appointment with Dr. [name] on 09/08/2017.  Patient's wife stated she would like to have that appointment time for patient.  Patient has been scheduled.  Wife will call back if patient states he does not want to come in for appointment.

## 2017-09-08 ENCOUNTER — Ambulatory Visit (INDEPENDENT_AMBULATORY_CARE_PROVIDER_SITE_OTHER): Payer: PPO | Admitting: Family Medicine

## 2017-09-08 ENCOUNTER — Telehealth: Payer: Self-pay | Admitting: Family Medicine

## 2017-09-08 ENCOUNTER — Encounter: Payer: Self-pay | Admitting: Family Medicine

## 2017-09-08 ENCOUNTER — Ambulatory Visit: Payer: PPO

## 2017-09-08 VITALS — BP 142/82 | HR 62 | Temp 97.7°F | Ht 71.0 in | Wt 255.0 lb

## 2017-09-08 VITALS — BP 142/82 | HR 62 | Resp 96 | Ht 71.0 in | Wt 255.0 lb

## 2017-09-08 DIAGNOSIS — Z0000 Encounter for general adult medical examination without abnormal findings: Secondary | ICD-10-CM

## 2017-09-08 DIAGNOSIS — R109 Unspecified abdominal pain: Secondary | ICD-10-CM | POA: Diagnosis not present

## 2017-09-08 NOTE — Progress Notes (Signed)
   Subjective:  Dale Wood is a 67 y.o. male who presents today with a chief complaint of right flank pain.   HPI:  Right Flank Pain, established problem Patient seen to half weeks ago for this.  At that time was treated for nephrolithiasis with a course of Flomax and diclofenac.  Symptoms persisted and patient underwent CT scan which confirmed nephrolithiasis in his right kidney.  Patient was then referred to urology where he was told that the stone was small enough that would pass on its own.  Pain is been taking ibuprofen 600 mg 3 times daily for the last couple of weeks.  Symptoms seem to be improving a little bit.  He has not noted any stone passage.  ROS: Per HPI  PMH: He reports that he has quit smoking. His smoking use included pipe and cigars. He has never used smokeless tobacco. He reports that he drinks about 1.0 standard drinks of alcohol per week. He reports that he does not use drugs.  Objective:  Physical Exam: BP (!) 142/82 (BP Location: Left Arm, Patient Position: Sitting, Cuff Size: Normal)   Pulse 62   Temp 97.7 F (36.5 C) (Oral)   Ht 5\' 11"  (1.803 m)   Wt 255 lb (115.7 kg)   SpO2 96%   BMI 35.57 kg/m   Gen: NAD, resting comfortably CV: RRR with no murmurs appreciated Pulm: NWOB, CTAB with no crackles, wheezes, or rhonchi MSK: Back without deformities.  No CVA tenderness.  Range of motion without pain.  Assessment/Plan:  Right Flank Pain Most likely secondary to symptomatic nephrolithiasis.  Discussed patient's scans with the radiologist and it appears his stone is moving slightly.  Continue Flomax and anti-inflammatories.  Also encouraged good oral hydration.  Patient does have a moderate amount of thoracic spine based on the CT scan which may be contributing.  Patient will follow-up with our sports medicine doctor, Dr. Paulla Fore for further evaluation management.  Time Spent: I spent >25 minutes face-to-face with the patient, with more than half spent on  counseling for his right flank pain management plan.   [name]. [name], MD 09/08/2017 1:13 PM

## 2017-09-08 NOTE — Progress Notes (Signed)
PCP notes:   Health maintenance: Schedule Flu Shot for this Fall   Abnormal screenings: None   Patient concerns: None   Nurse concerns:None   Next PCP appt: Just saw PCP prior to this visit, schedule as advised

## 2017-09-08 NOTE — Telephone Encounter (Signed)
Patient and wife have both been notified and have verbalized understanding.

## 2017-09-08 NOTE — Patient Instructions (Signed)
Dale Wood , Thank you for taking time to come for your Medicare Wellness Visit. I appreciate your ongoing commitment to your health goals. Please review the following plan we discussed and let me know if I can assist you in the future.   These are the goals we discussed: Goals    . Increase physical activity     Continue 3 days a week at the [facility]. Goal of adding in the adjustable height treadmill to prepare for [place] trip       This is a list of the screening recommended for you and due dates:  Health Maintenance  Topic Date Due  . Flu Shot  08/17/2017  . Pneumonia vaccines (2 of 2 - PPSV23) 11/22/2017  . Tetanus Vaccine  08/24/2020  . Colon Cancer Screening  10/11/2020  .  Hepatitis C: One time screening is recommended by Center for Disease Control  (CDC) for  adults born from [birth year] through 1965.   Completed    Preventive Care for Adults  A healthy lifestyle and preventive care can promote health and wellness. Preventive health guidelines for adults include the following key practices.  . A routine yearly physical is a good way to check with your health care provider about your health and preventive screening. It is a chance to share any concerns and updates on your health and to receive a thorough exam.  . Visit your dentist for a routine exam and preventive care every 6 months. Brush your teeth twice a day and floss once a day. Good oral hygiene prevents tooth decay and gum disease.  . The frequency of eye exams is based on your age, health, family medical history, use  of contact lenses, and other factors. Follow your health care provider's recommendations for frequency of eye exams.  . Eat a healthy diet. Foods like vegetables, fruits, whole grains, low-fat dairy products, and lean protein foods contain the nutrients you need without too many calories. Decrease your intake of foods high in solid fats, added sugars, and salt. Eat the right amount of calories for you. Get  information about a proper diet from your health care provider, if necessary.  . Regular physical exercise is one of the most important things you can do for your health. Most adults should get at least 150 minutes of moderate-intensity exercise (any activity that increases your heart rate and causes you to sweat) each week. In addition, most adults need muscle-strengthening exercises on 2 or more days a week.  Silver Sneakers may be a benefit available to you. To determine eligibility, you may visit the website: www.silversneakers.com or contact program at 225-721-4033 Mon-Fri between 8AM-8PM.   . Maintain a healthy weight. The body mass index (BMI) is a screening tool to identify possible weight problems. It provides an estimate of body fat based on height and weight. Your health care provider can find your BMI and can help you achieve or maintain a healthy weight.   For adults 20 years and older: ? A BMI below 18.5 is considered underweight. ? A BMI of 18.5 to 24.9 is normal. ? A BMI of 25 to 29.9 is considered overweight. ? A BMI of 30 and above is considered obese.   . Maintain normal blood lipids and cholesterol levels by exercising and minimizing your intake of saturated fat. Eat a balanced diet with plenty of fruit and vegetables. Blood tests for lipids and cholesterol should begin at age 86 and be repeated every 5 years. If your lipid  or cholesterol levels are high, you are over 50, or you are at high risk for heart disease, you may need your cholesterol levels checked more frequently. Ongoing high lipid and cholesterol levels should be treated with medicines if diet and exercise are not working.  . If you smoke, find out from your health care provider how to quit. If you do not use tobacco, please do not start.  . If you choose to drink alcohol, please do not consume more than 2 drinks per day. One drink is considered to be 12 ounces (355 mL) of beer, 5 ounces (148 mL) of wine, or 1.5  ounces (44 mL) of liquor.  . If you are 27-33 years old, ask your health care provider if you should take aspirin to prevent strokes.  . Use sunscreen. Apply sunscreen liberally and repeatedly throughout the day. You should seek shade when your shadow is shorter than you. Protect yourself by wearing long sleeves, pants, a wide-brimmed hat, and sunglasses year round, whenever you are outdoors.  . Once a month, do a whole body skin exam, using a mirror to look at the skin on your back. Tell your health care provider of new moles, moles that have irregular borders, moles that are larger than a pencil eraser, or moles that have changed in shape or color.

## 2017-09-08 NOTE — Patient Instructions (Signed)
It was very nice to see you today!  We will call the radiologist to discuss your scans.  Please come back to see Dr Paulla Fore soon.   Take care, Dr [name]

## 2017-09-08 NOTE — Progress Notes (Addendum)
Subjective:   Dale Wood is a 67 y.o. male who presents for Medicare Annual/Subsequent preventive examination.  Review of Systems:  No ROS.  Medicare Wellness Visit. Additional risk factors are reflected in the social history. Cardiac Risk Factors include: advanced age (>69men, >59 women);male gender    Patient currently lives with wife in a 2 story home. They have 5 cats and 3 dogs. Able to manage yard work and cleaning. Active in church, [place]. 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 Planning a mission trip to [place]. Has a large flower garden he enjoys working in. Works [employer].  Patient goes to bed around 11. Gets up at 4:45 and may get up 1 time a night to go to the bathroom. No CPAP. Feels rested when he wakes up. Objective:    Vitals: BP (!) 142/82 Comment: Per visit with Dr. [name]  Pulse 62   Resp (!) 96   Ht 5\' 11"  (1.803 m)   Wt 255 lb (115.7 kg)   BMI 35.57 kg/m   Body mass index is 35.57 kg/m.  Advanced Directives 09/08/2017 06/18/2015  Does Patient Have a Medical Advance Directive? No No  Would patient like information on creating a medical advance directive? No - Patient declined No - patient declined information    Tobacco Social History   Tobacco Use  Smoking Status Former Smoker  . Types: Pipe, Cigars  Smokeless Tobacco Never Used     Counseling given: Not Answered   Past Medical History:  Diagnosis Date  . Cancer ([place])    Skin Cancer on Nose  . Heart murmur   . Hyperlipidemia   . Hypertension   . Kidney stones   . Low testosterone   . Nephrolithiasis, uric acid   . Positive TB test   . Rheumatic fever   . Rheumatic fever   . Thyroid disease   . Unspecified disorder of kidney and ureter    Past Surgical History:  Procedure Laterality Date  . KNEE SURGERY Right    2005, 2008  . TONSILLECTOMY AND ADENOIDECTOMY     Family History  Problem Relation Age of Onset   . Cancer Mother   . Diabetes Mother   . Cancer Father   . Early death Father   . Diabetes Brother   . Cancer Brother        prostate  . Drug abuse Brother   . Birth defects Sister   . Diabetes Maternal Grandmother   . Thyroid disease Maternal Grandmother   . Stroke Maternal Grandmother   . Tremor Maternal Grandfather   . Obesity Son    Social History   Socioeconomic History  . Marital status: Married    Spouse name: Not on file  . Number of children: Not on file  . Years of education: Not on file  . Highest education level: Not on file  Occupational History  . Not on file  Social Needs  . Financial resource strain: Not on file  . Food insecurity:    Worry: Not on file    Inability: Not on file  . Transportation needs:    Medical: Not on file    Non-medical: Not on file  Tobacco Use  . Smoking status: Former Smoker    Types: Pipe, Landscape architect  . Smokeless tobacco: Never Used  Substance and Sexual Activity  . Alcohol use: Yes    Alcohol/week: 1.0 standard drinks  Types: 1 Cans of beer per week    Comment: occssionally  . Drug use: No  . Sexual activity: Yes  Lifestyle  . Physical activity:    Days per week: Not on file    Minutes per session: Not on file  . Stress: Not on file  Relationships  . Social connections:    Talks on phone: Not on file    Gets together: Not on file    Attends religious service: Not on file    Active member of club or organization: Not on file    Attends meetings of clubs or organizations: Not on file    Relationship status: Not on file  Other Topics Concern  . Not on file  Social History Narrative  . Not on file    Outpatient Encounter Medications as of 09/08/2017  Medication Sig  . polyethylene glycol powder (GLYCOLAX/MIRALAX) powder Take 17 g by mouth 2 (two) times daily as needed (Goal of 1-2 soft bowel movements daily.).  Marland Kitchen atorvastatin (LIPITOR) 40 MG tablet Take 1 tablet (40 mg total) by mouth daily. (Patient not taking:  Reported on 09/08/2017)  . tamsulosin (FLOMAX) 0.4 MG CAPS capsule Take 1 capsule (0.4 mg total) by mouth daily. (Patient not taking: Reported on 09/08/2017)  . [DISCONTINUED] acetaminophen (TYLENOL) 500 MG tablet Take 1,000 mg by mouth 2 (two) times daily.  . [DISCONTINUED] diclofenac (VOLTAREN) 75 MG EC tablet Take 1 tablet (75 mg total) by mouth 2 (two) times daily.  . [DISCONTINUED] docusate sodium (COLACE) 100 MG capsule Take 2 capsules (200 mg total) by mouth 2 (two) times daily.  . [DISCONTINUED] sildenafil (REVATIO) 20 MG tablet Take 1-5 tablets (20-100 mg total) by mouth daily as needed (erectile dysfunction).   No facility-administered encounter medications on file as of 09/08/2017.     Activities of Daily Living In your present state of health, do you have any difficulty performing the following activities: 09/08/2017  Hearing? N  Vision? N  Difficulty concentrating or making decisions? N  Walking or climbing stairs? N  Dressing or bathing? N  Doing errands, shopping? N  Preparing Food and eating ? N  Using the Toilet? N  In the past six months, have you accidently leaked urine? N  Do you have problems with loss of bowel control? N  Managing your Medications? N  Managing your Finances? N  Housekeeping or managing your Housekeeping? N  Some recent data might be hidden    Patient Care Team: Vivi Barrack, MD as PCP - General (Family Medicine)   Assessment:   This is a routine wellness examination for Dale Wood.  Exercise Activities and Dietary recommendations Current Exercise Habits: Structured exercise class(Member at the [facility]), Type of exercise: strength training/weights;Other - see comments;treadmill(5 miles on the bike), Time (Minutes): 60, Frequency (Times/Week): 3, Weekly Exercise (Minutes/Week): 180, Intensity: Moderate, Exercise limited by: orthopedic condition(s)   Breakfast: Cup of Coffee ( creamer)  Lunch: Leftovers, a sandwich, or hot dogs, hamburgers, wings  from work, water to drink  Dinner: Jethro Bolus with fries, water to drink Goals    . Increase physical activity     Continue 3 days a week at the [facility]. Goal of adding in the adjustable height treadmill to prepare for [place] trip       Fall Risk Fall Risk  09/08/2017 11/22/2016 07/30/2015  Falls in the past year? No No No    Depression Screen PHQ 2/9 Scores 09/08/2017 11/22/2016  PHQ - 2 Score 0 0  Cognitive Function     6CIT Screen 09/08/2017  What Year? 0 points  What month? 0 points  What time? 0 points  Count back from 20 0 points  Months in reverse 0 points    Immunization History  Administered Date(s) Administered  . Influenza, High Dose Seasonal PF 11/22/2016  . Pneumococcal Conjugate-13 11/22/2016  . Td 01/17/2001  . Tdap 08/25/2010      Screening Tests Health Maintenance  Topic Date Due  . INFLUENZA VACCINE  08/17/2017  . PNA vac Low Risk Adult (2 of 2 - PPSV23) 11/22/2017  . TETANUS/TDAP  08/24/2020  . COLONOSCOPY  10/11/2020  . Hepatitis C Screening  Completed       Plan:    Follow Up with PCP as advised  I have personally reviewed and noted the following in the patient's chart:   . Medical and social history . Use of alcohol, tobacco or illicit drugs  . Current medications and supplements . Functional ability and status . Nutritional status . Physical activity . Advanced directives . List of other physicians . Vitals . Screenings to include cognitive, depression, and falls . Referrals and appointments  In addition, I have reviewed and discussed with patient certain preventive protocols, quality metrics, and best practice recommendations. A written personalized care plan for preventive services as well as general preventive health recommendations were provided to patient.     [place], [place]  [id number]

## 2017-09-08 NOTE — Progress Notes (Signed)
I have personally reviewed the Medicare Annual Wellness Visit and agree with the assessment and plan.  [name]. [name], MD 09/08/2017 1:14 PM

## 2017-09-08 NOTE — Telephone Encounter (Signed)
Please let patient know that I spoke with radiology who reviewed both of his most recent CT scans. His stone is moving slightly per the radiologist review and thinks that it should eventually pass on its on. I would like for him to continue flomax to help facilitate the passage of the stone and I would also recommend him increase his fluid intake to at least half a gallon to a gallon of water per day.  If his pain does not improve over the next few weeks, could consider seeing another urologist for a second opinion.  [name]. [name], MD 09/08/2017 1:10 PM

## 2017-11-08 ENCOUNTER — Encounter: Payer: Self-pay | Admitting: Physician Assistant

## 2017-11-08 ENCOUNTER — Ambulatory Visit (INDEPENDENT_AMBULATORY_CARE_PROVIDER_SITE_OTHER): Payer: PPO | Admitting: Physician Assistant

## 2017-11-08 ENCOUNTER — Ambulatory Visit (INDEPENDENT_AMBULATORY_CARE_PROVIDER_SITE_OTHER): Payer: PPO

## 2017-11-08 VITALS — BP 180/82 | HR 114 | Temp 98.8°F | Resp 18 | Wt 249.0 lb

## 2017-11-08 DIAGNOSIS — J40 Bronchitis, not specified as acute or chronic: Secondary | ICD-10-CM | POA: Diagnosis not present

## 2017-11-08 DIAGNOSIS — R51 Headache: Secondary | ICD-10-CM | POA: Diagnosis not present

## 2017-11-08 DIAGNOSIS — R0602 Shortness of breath: Secondary | ICD-10-CM | POA: Diagnosis not present

## 2017-11-08 DIAGNOSIS — R52 Pain, unspecified: Secondary | ICD-10-CM

## 2017-11-08 DIAGNOSIS — R05 Cough: Secondary | ICD-10-CM | POA: Diagnosis not present

## 2017-11-08 DIAGNOSIS — R531 Weakness: Secondary | ICD-10-CM | POA: Diagnosis not present

## 2017-11-08 DIAGNOSIS — K8020 Calculus of gallbladder without cholecystitis without obstruction: Secondary | ICD-10-CM | POA: Diagnosis not present

## 2017-11-08 DIAGNOSIS — R000 Tachycardia, unspecified: Secondary | ICD-10-CM | POA: Diagnosis not present

## 2017-11-08 DIAGNOSIS — Z87891 Personal history of nicotine dependence: Secondary | ICD-10-CM | POA: Diagnosis not present

## 2017-11-08 DIAGNOSIS — R059 Cough, unspecified: Secondary | ICD-10-CM

## 2017-11-08 DIAGNOSIS — R600 Localized edema: Secondary | ICD-10-CM | POA: Diagnosis not present

## 2017-11-08 DIAGNOSIS — J3489 Other specified disorders of nose and nasal sinuses: Secondary | ICD-10-CM | POA: Diagnosis not present

## 2017-11-08 DIAGNOSIS — R509 Fever, unspecified: Secondary | ICD-10-CM | POA: Diagnosis not present

## 2017-11-08 DIAGNOSIS — I10 Essential (primary) hypertension: Secondary | ICD-10-CM | POA: Diagnosis not present

## 2017-11-08 LAB — POCT INFLUENZA A/B
Influenza A, POC: NEGATIVE
Influenza B, POC: NEGATIVE

## 2017-11-08 NOTE — Progress Notes (Signed)
Dale Wood is a 67 y.o. male here for a new problem.  History of Present Illness:   Chief Complaint  Patient presents with  . URI    HPI  Started yesterday morning. Started with gradual cough with sputum production, fatigue. Now with chills, head congestion, body aches, HA, nausea. Denies vomiting or diarrhea. Appetite is good.  No hx of PNA but does have history of positive TB test that was treated with oral medications per his report. Did not receive a flu shot this year.  He states that his symptoms have been getting worse with time. He also states that on the way to work and on the way here, he states that he had to stop several times to "rest his eyes" because he was going to fall asleep. Denies CP. Has questionable SOB. Former smoker.  BP Readings from Last 3 Encounters:  11/08/17 (!) 180/82  09/08/17 (!) 142/82  09/08/17 (!) 142/82      Past Medical History:  Diagnosis Date  . Cancer ([place])    Skin Cancer on Nose  . Heart murmur   . Hyperlipidemia   . Hypertension   . Kidney stones   . Low testosterone   . Nephrolithiasis, uric acid   . Positive TB test   . Rheumatic fever   . Rheumatic fever   . Thyroid disease   . Unspecified disorder of kidney and ureter      Social History   Socioeconomic History  . Marital status: Married    Spouse name: Not on file  . Number of children: Not on file  . Years of education: Not on file  . Highest education level: Not on file  Occupational History  . Not on file  Social Needs  . Financial resource strain: Not on file  . Food insecurity:    Worry: Not on file    Inability: Not on file  . Transportation needs:    Medical: Not on file    Non-medical: Not on file  Tobacco Use  . Smoking status: Former Smoker    Types: Pipe, Landscape architect  . Smokeless tobacco: Never Used  Substance and Sexual Activity  . Alcohol use: Yes    Alcohol/week: 1.0 standard drinks    Types: 1 Cans of beer per week    Comment:  occssionally  . Drug use: No  . Sexual activity: Yes  Lifestyle  . Physical activity:    Days per week: Not on file    Minutes per session: Not on file  . Stress: Not on file  Relationships  . Social connections:    Talks on phone: Not on file    Gets together: Not on file    Attends religious service: Not on file    Active member of club or organization: Not on file    Attends meetings of clubs or organizations: Not on file    Relationship status: Not on file  . Intimate partner violence:    Fear of current or ex partner: Not on file    Emotionally abused: Not on file    Physically abused: Not on file    Forced sexual activity: Not on file  Other Topics Concern  . Not on file  Social History Narrative  . Not on file    Past Surgical History:  Procedure Laterality Date  . KNEE SURGERY Right    2005, 2008  . TONSILLECTOMY AND ADENOIDECTOMY      Family History  Problem Relation Age  of Onset  . Cancer Mother   . Diabetes Mother   . Cancer Father   . Early death Father   . Diabetes Brother   . Cancer Brother        prostate  . Drug abuse Brother   . Birth defects Sister   . Diabetes Maternal Grandmother   . Thyroid disease Maternal Grandmother   . Stroke Maternal Grandmother   . Tremor Maternal Grandfather   . Obesity Son     Allergies  Allergen Reactions  . Androgel [Testosterone]     *Pains in Chest/Axillary area*  . Penicillins     *Does Not Work*    Current Medications:   Current Outpatient Medications:  .  atorvastatin (LIPITOR) 40 MG tablet, Take 1 tablet (40 mg total) by mouth daily., Disp: 90 tablet, Rfl: 3 .  polyethylene glycol powder (GLYCOLAX/MIRALAX) powder, Take 17 g by mouth 2 (two) times daily as needed (Goal of 1-2 soft bowel movements daily.)., Disp: 3350 g, Rfl: 1 .  tamsulosin (FLOMAX) 0.4 MG CAPS capsule, Take 1 capsule (0.4 mg total) by mouth daily., Disp: 30 capsule, Rfl: 3   Review of Systems:   ROS  Negative unless otherwise  specified per HPI.  Vitals:   Vitals:   11/08/17 1320 11/08/17 1355  BP: (!) 174/82 (!) 180/82  Pulse: (!) 115 (!) 114  Resp: 18 18  Temp: 98.8 F (37.1 C)   TempSrc: Oral   SpO2: 94% 94%  Weight: 249 lb (112.9 kg)      Body mass index is 34.73 kg/m.  Physical Exam:   Physical Exam  Constitutional: He appears well-developed. He is cooperative.  Non-toxic appearance. He does not have a sickly appearance. He does not appear ill. No distress.  HENT:  Head: Normocephalic and atraumatic.  Right Ear: Tympanic membrane, external ear and ear canal normal. Tympanic membrane is not erythematous, not retracted and not bulging.  Left Ear: Tympanic membrane, external ear and ear canal normal. Tympanic membrane is not erythematous, not retracted and not bulging.  Nose: Nose normal. Right sinus exhibits no maxillary sinus tenderness and no frontal sinus tenderness. Left sinus exhibits no maxillary sinus tenderness and no frontal sinus tenderness.  Mouth/Throat: Uvula is midline. No posterior oropharyngeal edema or posterior oropharyngeal erythema.  Eyes: Conjunctivae and lids are normal.  Neck: Trachea normal.  Cardiovascular: Regular rhythm, S1 normal, S2 normal and normal heart sounds. Tachycardia present.  Pulmonary/Chest: Effort normal and breath sounds normal. He has no decreased breath sounds. He has no wheezes. He has no rhonchi. He has no rales.  Appears winded when lying down for EKG  Lymphadenopathy:    He has no cervical adenopathy.  Neurological: He is alert.  Skin: Skin is warm, dry and intact.  Psychiatric: He has a normal mood and affect. His speech is normal and behavior is normal.  Nursing note and vitals reviewed.  EKG with sinus tachycardia  CXR pending  Assessment and Plan:    [name] was seen today for uri.  Diagnoses and all orders for this visit:  Body aches -     POCT Influenza A/B  Cough -     DG Chest 2 View; Future  Tachycardia -     EKG  12-Lead   Concerning symptoms. Vitals remain unstable. Symptoms worsening while in office. Discussed need for further work-up and likely need for IVF and stat labs. Patient refused EMS transport and refused for [place] to call his wife to see if she  will come pick him up. Signed AMA form to leave office and stated he would go to ER by personal transport despite my strong recommendation against this. AMA form to be scanned into media file.  . Reviewed expectations re: course of current medical issues. . Discussed self-management of symptoms. . Outlined signs and symptoms indicating need for more acute intervention. . Patient verbalized understanding and all questions were answered. . See orders for this visit as documented in the electronic medical record. . Patient received an After-Visit Summary.  Inda Coke, PA-C

## 2017-11-08 NOTE — Patient Instructions (Signed)
Please go to the ER for further workup.

## 2017-11-09 ENCOUNTER — Telehealth: Payer: Self-pay | Admitting: *Deleted

## 2017-11-09 ENCOUNTER — Encounter: Payer: Self-pay | Admitting: Family Medicine

## 2017-11-09 ENCOUNTER — Ambulatory Visit (INDEPENDENT_AMBULATORY_CARE_PROVIDER_SITE_OTHER): Payer: PPO | Admitting: Family Medicine

## 2017-11-09 VITALS — BP 146/74 | HR 65 | Temp 98.1°F | Ht 71.0 in | Wt 249.0 lb

## 2017-11-09 DIAGNOSIS — R030 Elevated blood-pressure reading, without diagnosis of hypertension: Secondary | ICD-10-CM | POA: Diagnosis not present

## 2017-11-09 DIAGNOSIS — R05 Cough: Secondary | ICD-10-CM

## 2017-11-09 DIAGNOSIS — J40 Bronchitis, not specified as acute or chronic: Secondary | ICD-10-CM | POA: Diagnosis not present

## 2017-11-09 DIAGNOSIS — R000 Tachycardia, unspecified: Secondary | ICD-10-CM

## 2017-11-09 HISTORY — DX: Tachycardia, unspecified: R00.0

## 2017-11-09 MED ORDER — BENZONATATE 200 MG PO CAPS: 200.0000 mg | ORAL_CAPSULE | Freq: Two times a day (BID) | ORAL | 0 refills | Status: DC | PRN

## 2017-11-09 MED ORDER — METHYLPREDNISOLONE ACETATE 80 MG/ML IJ SUSP
80.0000 mg | Freq: Once | INTRAMUSCULAR | Status: AC
  Administered 2017-11-09: 80 mg via INTRAMUSCULAR

## 2017-11-09 MED ORDER — ATENOLOL 50 MG PO TABS: 50.0000 mg | ORAL_TABLET | Freq: Every day | ORAL | 2 refills | Status: DC

## 2017-11-09 NOTE — Assessment & Plan Note (Signed)
Mildly elevated today, however much improved compared to yesterday.  Continue atenolol 50 mg daily.  Of note, patient has been typically in the 130s over 80s.  We will continue to watch for the next several weeks to few months.  If patient is able to get back into the 120s-130s over 70s - 80s, will decrease or stop antihypertensives.  Discussed reasons return to care.

## 2017-11-09 NOTE — Assessment & Plan Note (Addendum)
Normal rate today.  Likely elevated yesterday in setting of acute illness and dehydration.  Continue atenolol 50 mg daily.

## 2017-11-09 NOTE — Progress Notes (Signed)
   Subjective:  Dale Wood is a 66 y.o. male who presents today with a chief complaint of cough.   HPI:  Cough/bronchitis, new problem Patient was seen in clinic yesterday with body aches, hypertension, and tachycardia. He was found to be somnolent and sent to emergency department for further evaluation where he underwent CT scan and blood work. Work up in the ED was consistent with bronchitis.  He was started on doxycycline and discharged home.  Patient was also found to be hypertensive and tachycardic yesterday.  He was started on atenolol 50 mg daily in the emergency department yesterday.  Symptoms seem to be improving today.  He has mild chest pain with coughing, but no chest pain or shortness of breath at rest.  He has continued taking DayQuil and NyQuil.  He has not tried any other medications or treatments.  No other obvious alleviating or aggravating factors.  ROS: Per HPI  PMH: He reports that he has quit smoking. His smoking use included pipe and cigars. He has never used smokeless tobacco. He reports that he drinks about 1.0 standard drinks of alcohol per week. He reports that he does not use drugs.  Objective:  Physical Exam: BP (!) 146/74 (BP Location: Left Arm, Patient Position: Sitting, Cuff Size: Normal)   Pulse 65   Temp 98.1 F (36.7 C) (Oral)   Ht 5\' 11"  (1.803 m)   Wt 249 lb (112.9 kg)   SpO2 92%   BMI 34.73 kg/m   Gen: NAD, resting comfortably CV: RRR with no murmurs appreciated Pulm: NWOB, CTAB with no crackles, wheezes, or rhonchi  Assessment/Plan:  Tachycardia Normal rate today.  Likely elevated yesterday in setting of acute illness and dehydration.  Continue atenolol 50 mg daily.  Elevated blood pressure reading Mildly elevated today, however much improved compared to yesterday.  Continue atenolol 50 mg daily.  Of note, patient has been typically in the 130s over 80s.  We will continue to watch for the next several weeks to few months.  If patient is  able to get back into the 120s-130s over 70s - 80s, will decrease or stop antihypertensives.  Discussed reasons return to care.  Bronchitis No red flag signs or symptoms.  Continue course of doxycycline.  We will also give Tessalon for cough.  Also give 80 mg of IM Depo-Medrol today.  Encouraged good oral hydration.  Discussed reasons to return to care.  [name]. [name], MD 11/09/2017 2:09 PM

## 2017-11-09 NOTE — Telephone Encounter (Signed)
Called and spoke to patient regarding appointment scheduled for today. He states that he has a deep cough with grayish mucous. Denies shortness of breath or fever. Endorses chest pain when he coughs. States he took the medications while in the ED but has not picked up his prescriptions. States that he does not have any new symptoms but was told to follow up with PCP.

## 2017-11-09 NOTE — Patient Instructions (Signed)
It was very nice to see you today!  Please start the tessalon.  We will give you a shot of depomedrol today. This is a steroid shot.  Please stay well hydrated. You can take tylenol and/or motrin as needed.  Let me know if your symptoms worse or do not improve over the next few days.  Come back once you are feeling better for your flu and shingles shot.   Take care, Dr [name]

## 2017-11-20 ENCOUNTER — Telehealth: Payer: Self-pay | Admitting: *Deleted

## 2017-11-20 NOTE — Telephone Encounter (Signed)
Patient phoned for flu test results. He reports he somewhat feeling better but not a lot. About QOD is a "bad"cough day. Reports he can't tell tessolon perles are helping or not. He is on the last day of the atenolol, abx and perles. Cough is mostly non productive. He does not have a b/p cuff to check b/p and hr. Agreed to a follow up visit since cough continues and no way to check b/p and hr regarding taking the atenolol. Appointment tomorrow with PCP.

## 2017-11-21 ENCOUNTER — Ambulatory Visit (INDEPENDENT_AMBULATORY_CARE_PROVIDER_SITE_OTHER): Payer: PPO | Admitting: Family Medicine

## 2017-11-21 ENCOUNTER — Encounter: Payer: Self-pay | Admitting: Family Medicine

## 2017-11-21 VITALS — BP 144/84 | HR 58 | Temp 97.6°F | Ht 71.0 in | Wt 249.0 lb

## 2017-11-21 DIAGNOSIS — R05 Cough: Secondary | ICD-10-CM

## 2017-11-21 DIAGNOSIS — R000 Tachycardia, unspecified: Secondary | ICD-10-CM

## 2017-11-21 DIAGNOSIS — R059 Cough, unspecified: Secondary | ICD-10-CM

## 2017-11-21 DIAGNOSIS — R030 Elevated blood-pressure reading, without diagnosis of hypertension: Secondary | ICD-10-CM

## 2017-11-21 NOTE — Progress Notes (Signed)
   Subjective:  Dale Wood is a 67 y.o. male who presents today with a chief complaint of cough.   HPI:  Cough, established problem Seen 2 weeks ago for this.  Still has a bit of lingering cough, however symptoms have improved.  No fevers or chills.  Hypertension/tachycardia, established problems Also seen 2 weeks ago for this.  At that time he was recently started on atenolol 40 mg daily.  He has been compliant with this medication, would like to stop.  No reported chest pain or shortness of breath.  ROS: Per HPI  PMH: He reports that he has quit smoking. His smoking use included pipe and cigars. He has never used smokeless tobacco. He reports that he drinks about 1.0 standard drinks of alcohol per week. He reports that he does not use drugs.  Objective:  Physical Exam: BP (!) 144/84 (BP Location: Left Arm, Patient Position: Sitting, Cuff Size: Normal)   Pulse (!) 58   Temp 97.6 F (36.4 C) (Oral)   Ht 5\' 11"  (1.803 m)   Wt 249 lb (112.9 kg)   SpO2 94%   BMI 34.73 kg/m   Gen: NAD, resting comfortably CV: RRR with no murmurs appreciated Pulm: NWOB, CTAB with no crackles, wheezes, or rhonchi  Assessment/Plan:  Tachycardia Patient's blood pressure goal per JNC 8 guidelines and slightly bradycardic today.  We will wean off atenolol.  Recommended patient take 50 mg every other day for the next week, then stop.  Discussed importance of home blood pressure monitoring with goal 150/90 or lower.  Discussed reasons to return to care.  Cough Postinfectious.  No red flag signs or symptoms.  Reassured patient.  Should resolve over the next several weeks.  Recommended honey as needed.  Discussed reasons to return to care.  Follow-up as needed.  Preventive health care Patient will go to the pharmacy to get his flu shot.  [name]. [name], MD 11/21/2017 5:02 PM

## 2017-11-21 NOTE — Assessment & Plan Note (Signed)
Patient's blood pressure goal per JNC 8 guidelines and slightly bradycardic today.  We will wean off atenolol.  Recommended patient take 50 mg every other day for the next week, then stop.  Discussed importance of home blood pressure monitoring with goal 150/90 or lower.  Discussed reasons to return to care.

## 2017-11-21 NOTE — Patient Instructions (Signed)
It was very nice to see you today!  Continue to improve over the next couple of weeks.  This is part of your body's healing process.  It is okay for you to stop atenolol.  Please take every other day until you run out.  Please let me know if your blood pressure is persistently 150/90 or higher.  Take care, Dr [name]

## 2017-12-29 ENCOUNTER — Encounter: Payer: Self-pay | Admitting: Family Medicine

## 2017-12-29 ENCOUNTER — Ambulatory Visit (INDEPENDENT_AMBULATORY_CARE_PROVIDER_SITE_OTHER): Payer: PPO | Admitting: Family Medicine

## 2017-12-29 VITALS — BP 188/102 | HR 79 | Temp 98.3°F | Ht 71.0 in | Wt 249.0 lb

## 2017-12-29 DIAGNOSIS — R0602 Shortness of breath: Secondary | ICD-10-CM | POA: Diagnosis not present

## 2017-12-29 DIAGNOSIS — R079 Chest pain, unspecified: Secondary | ICD-10-CM

## 2017-12-29 DIAGNOSIS — I10 Essential (primary) hypertension: Secondary | ICD-10-CM

## 2017-12-29 LAB — CBC
HCT: 45.2 % (ref 38.5–50.0)
Hemoglobin: 15.2 g/dL (ref 13.2–17.1)
MCH: 28 pg (ref 27.0–33.0)
MCHC: 33.6 g/dL (ref 32.0–36.0)
MCV: 83.2 fL (ref 80.0–100.0)
MPV: 9.5 fL (ref 7.5–12.5)
Platelets: 301 10*3/uL (ref 140–400)
RBC: 5.43 10*6/uL (ref 4.20–5.80)
RDW: 14.4 % (ref 11.0–15.0)
WBC: 6.9 10*3/uL (ref 3.8–10.8)

## 2017-12-29 LAB — BASIC METABOLIC PANEL
BUN: 16 mg/dL (ref 7–25)
CO2: 29 mmol/L (ref 20–32)
Calcium: 9.2 mg/dL (ref 8.6–10.3)
Chloride: 104 mmol/L (ref 98–110)
Creat: 1.12 mg/dL (ref 0.70–1.25)
Glucose, Bld: 128 mg/dL — ABNORMAL HIGH (ref 65–99)
Potassium: 3.9 mmol/L (ref 3.5–5.3)
Sodium: 140 mmol/L (ref 135–146)

## 2017-12-29 LAB — TSH: TSH: 4.84 mIU/L — ABNORMAL HIGH (ref 0.40–4.50)

## 2017-12-29 MED ORDER — AMLODIPINE BESYLATE 10 MG PO TABS: 10.0000 mg | ORAL_TABLET | Freq: Every day | ORAL | 3 refills | Status: DC

## 2017-12-29 NOTE — Patient Instructions (Signed)
It was very nice to see you today!  Please start the amlodipine 10mg  daily.  We will check blood work today.  Please keep an eye on your BP and let me know how it is going early next week.   Come back to see me soon.   Go to the ED if you have any worsening chest pain or shortness of breath.   Take care, Dr [name]

## 2017-12-29 NOTE — Progress Notes (Signed)
   Subjective:  Dale Wood is a 67 y.o. male who presents today with a chief complaint of HTN.   HPI:  Hypertension, chronic problem, uncontrolled He was last seen about 6 weeks ago for this.  He stopped taking his atenolol as he ran out of his prescription and never asked for refill.  He has been checking his blood pressure at the [facility] and is typically been running into the 180s to 190s over 100s.  He has had a few episodes of substernal chest pain and shortness of breath.  Some difficulty with deep breaths.  Does not have any current symptoms.  ROS: Per HPI  PMH: He reports that he has quit smoking. His smoking use included pipe and cigars. He has never used smokeless tobacco. He reports current alcohol use of about 1.0 standard drinks of alcohol per week. He reports that he does not use drugs.  Objective:  Physical Exam: BP (!) 188/102   Pulse 79   Temp 98.3 F (36.8 C) (Oral)   Ht 5\' 11"  (1.803 m)   Wt 249 lb (112.9 kg)   SpO2 97%   BMI 34.73 kg/m   Gen: NAD, resting comfortably CV: RRR with no murmurs appreciated Pulm: NWOB, CTAB with no crackles, wheezes, or rhonchi  EKG: Normal sinus rhythm.  No acute ischemic changes.  Assessment/Plan:  Hypertension Significantly elevated today.  Given he does not have any symptoms and his EKG is without concerning findings, I do not think he needs to be sent to the ED at this point.  Will check TSH, CBC, and BMET.  We will start amlodipine 10 mg daily.  He will keep a close eye on his blood pressure over the next couple of days and send me a message with his blood pressure readings early next week.  Discussed strict reasons to return to care and strict reasons to seek emergent care including development of any symptoms including chest pain, shortness of breath, headache, or dizziness.  Time Spent: I spent >25 minutes face-to-face with the patient, with more than half spent on counseling for management plan for his  hypertension.   [name]. [name], MD 12/29/2017 5:27 PM

## 2017-12-29 NOTE — Assessment & Plan Note (Signed)
Significantly elevated today.  Given he does not have any symptoms and his EKG is without concerning findings, I do not think he needs to be sent to the ED at this point.  Will check TSH, CBC, and BMET.  We will start amlodipine 10 mg daily.  He will keep a close eye on his blood pressure over the next couple of days and send me a message with his blood pressure readings early next week.  Discussed strict reasons to return to care and strict reasons to seek emergent care including development of any symptoms including chest pain, shortness of breath, headache, or dizziness.

## 2018-01-01 NOTE — Progress Notes (Signed)
Please inform patient of the following:  His blood work is all stable. Do not need to make any changes to his treatment plan at this time. Would like for him to send [place] a log of his BP readings ASAP as we discussed at his office visit on Friday.  Dale Wood. [name], MD 01/01/2018 8:33 AM

## 2018-01-08 ENCOUNTER — Ambulatory Visit (INDEPENDENT_AMBULATORY_CARE_PROVIDER_SITE_OTHER): Payer: PPO | Admitting: Family Medicine

## 2018-01-08 ENCOUNTER — Encounter: Payer: Self-pay | Admitting: Family Medicine

## 2018-01-08 VITALS — BP 138/84 | HR 83 | Temp 97.9°F | Ht 71.0 in | Wt 249.0 lb

## 2018-01-08 DIAGNOSIS — R079 Chest pain, unspecified: Secondary | ICD-10-CM | POA: Diagnosis not present

## 2018-01-08 DIAGNOSIS — I10 Essential (primary) hypertension: Secondary | ICD-10-CM

## 2018-01-08 NOTE — Assessment & Plan Note (Addendum)
At goal.  Continue amlodipine 10 mg daily.  Continue home blood pressure monitoring goal 140/90.  He will send me a MyChart message in a few weeks with his home blood pressure readings.  Discussed reasons to return to care and seek emergent care.

## 2018-01-08 NOTE — Progress Notes (Signed)
   Subjective:  Dale Wood is a 67 y.o. male who presents today with a chief complaint of HTN.   HPI:  HTN, chronic problem Seen 10 days ago for this. At that time his BP severely elevated to the 180s/100s. We started him on amlodipine 10mg  daily. He has done well with this. No side effects. Home readings have improved, though still fluctuate.  Home readings typically in the 130s to 150s over 80s to 90s.  Chest Pain, new problem Patient also reports intermittent episodes of substernal chest pain with associated shortness of breath for the past couple of weeks.  No current chest pain or shortness of breath.  Symptoms typically associated with exertion such as climbing a flight of stairs or walking long distances.  Symptoms last for a couple of minutes and then subside with rest.  No dizziness.  No weakness or numbness.  Recently had bronchitis 2 months ago which he thinks is contributing.  No treatments tried.  ROS: Per HPI  PMH: He reports that he has quit smoking. His smoking use included pipe and cigars. He has never used smokeless tobacco. He reports current alcohol use of about 1.0 standard drinks of alcohol per week. He reports that he does not use drugs.  Objective:  Physical Exam: BP 138/84 (BP Location: Left Arm, Patient Position: Sitting, Cuff Size: Normal)   Pulse 83   Temp 97.9 F (36.6 C) (Oral)   Ht 5\' 11"  (1.803 m)   Wt 249 lb (112.9 kg)   SpO2 94%   BMI 34.73 kg/m   Gen: NAD, resting comfortably CV: RRR with no murmurs appreciated Pulm: NWOB, CTAB with no crackles, wheezes, or rhonchi MSK: No edema, cyanosis, or clubbing noted Skin: Warm, dry Neuro: Grossly normal, moves all extremities Psych: Normal affect and thought content  Assessment/Plan:  Hypertension At goal.  Continue amlodipine 10 mg daily.  Continue home blood pressure monitoring goal 140/90.  He will send me a MyChart message in a few weeks with his home blood pressure readings.  Discussed reasons  to return to care and seek emergent care.  Chest Pain No current chest pain. EKG without signs of ischemic changes last month. Given exertional symptoms and his comorbidities, will send for stress testing. Discussed strict ER precautions and reasons to return to care.    [name]. [name], MD 01/08/2018 4:53 PM

## 2018-01-08 NOTE — Patient Instructions (Signed)
It was very nice to see you today!  Your blood pressure looks much better.  Please continue taking amlodipine 10 mg daily.  Please keep a close eye on your blood pressure and send me a message in a few weeks with your blood pressure readings.  Try to check your blood pressure at the same time every day.  I would like to get a stress test to make sure that the chest pain you are having is not coming from your heart.  You will be called to get that set up.  Please go to the emergency room if you have severe chest pain that lasts more than a few minutes.  Take care, Dr [name]

## 2018-01-23 ENCOUNTER — Ambulatory Visit (INDEPENDENT_AMBULATORY_CARE_PROVIDER_SITE_OTHER): Payer: PPO

## 2018-01-23 DIAGNOSIS — R079 Chest pain, unspecified: Secondary | ICD-10-CM | POA: Diagnosis not present

## 2018-01-23 LAB — EXERCISE TOLERANCE TEST
Estimated workload: 7 METS
Exercise duration (min): 5 min
Exercise duration (sec): 40 s
MPHR: 153 {beats}/min
Peak HR: 122 {beats}/min
Percent HR: 79 %
RPE: 15
Rest HR: 70 {beats}/min

## 2018-01-24 ENCOUNTER — Other Ambulatory Visit: Payer: Self-pay

## 2018-01-24 DIAGNOSIS — R079 Chest pain, unspecified: Secondary | ICD-10-CM

## 2018-01-24 NOTE — Progress Notes (Signed)
Please inform patient of the following:  His stress test was inconclusive. Recommend referral to cardiology for further evaluation and to make sure that we do not need to do any further testing.  Dale Wood. [name], MD 01/24/2018 8:11 AM

## 2018-01-25 NOTE — Progress Notes (Signed)
Cardiology Office Note   Date:  01/29/2018   ID:  Dale Wood, DOB [birth year], MRN [id number]  PCP:  Vivi Barrack, MD  Cardiologist:   Jenkins Rouge, MD   No chief complaint on file.     History of Present Illness: Dale Wood is a 68 y.o. male who presents for consultation regarding chest pain Referred by Dr [name]  Reviewed his office note from 01/08/18 Patient had significant HTN and started on norvasc. He complained of episodes of intermittent  Substernal chest pain and dyspnea over last few weeks. Associated with exertion and going up stairs Lasting Minutes then subside Had bronchitis prior to onset and thought this might be related Previous smoker with mild ETOH intake Sent for stress testing Reviewed. 5;40 minutes on treadmill but only reached 79% of PMHR 122 bpm.  HTN response to exercise but no ST changes.   Labs reviewed TSH mildly elevated 4.8 Hct, BUN,CR K ok CXR 11/08/17 during URI NAD   Pain is atypical rest/stress. Lasts minutes crampy. 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 Abdominal pain, epigastric 10/27/2008   Qualifier: Diagnosis of  By: Ronnald Ramp MD, [name].   Marland Kitchen ANKLE INJURY, RIGHT 09/22/2009   Qualifier: Diagnosis of  By: Nathaneil Canary, CMA, [name]    . BASAL CELL CARCINOMA, FACE 10/27/2008   Qualifier: Diagnosis of  By: Ronnald Ramp MD, [name].   Marland Kitchen BASAL CELL CARCINOMA, FACE 10/27/2008   Qualifier: Diagnosis of By: Ronnald Ramp MD, [name].   . Cancer ([place])    Skin Cancer on Nose  . CELLULITIS, FOOT 09/22/2009   Qualifier: Diagnosis of  By: Ronnald Ramp MD, [name].   . Constipation 01/05/2017  . DYSMETABOLIC SYNDROME 08/26/9831   Qualifier: Diagnosis  of  By: Ronnald Ramp MD, [place] 10/27/2008   Qualifier: Diagnosis of  By: Ronnald Ramp MD, [name].   . ERECTILE DYSFUNCTION, ORGANIC 10/27/2008   Qualifier: Diagnosis of  By: Ronnald Ramp MD, [name].   . Headache 07/30/2015  . Heart murmur   . Hyperlipidemia   . HYPERLIPIDEMIA 10/27/2008   Qualifier: Diagnosis of  By: Ronnald Ramp MD, [name].   . Hypertension   . HYPOGONADISM 11/26/2008   Qualifier: Diagnosis of  By: Ronnald Ramp MD, [name].   . Kidney stones   . Low testosterone   . NEPHROLITHIASIS, HX OF 10/27/2008   Qualifier: Diagnosis of  By: Ronnald Ramp MD, [name].   . Nephrolithiasis, uric acid   . OSTEOARTHRITIS 10/27/2008   Qualifier: Diagnosis of  By: Ronnald Ramp MD, [name].   Marland Kitchen Positive TB test   . Postherpetic neuralgia 03/27/2017  . Rheumatic fever   . Rheumatic fever   . SKIN CANCER, HX OF 10/27/2008   Qualifier: Diagnosis of  By: Ronnald Ramp MD, [name].   . Tachycardia 11/09/2017  . Thyroid disease   . Unspecified disorder of kidney and ureter   . URI 02/05/2009   Qualifier: Diagnosis of  By: Ronnald Ramp MD, [name].     Past Surgical History:  Procedure Laterality Date  . KNEE SURGERY Right    2005, 2008  .  TONSILLECTOMY AND ADENOIDECTOMY       Current Outpatient Medications  Medication Sig Dispense Refill  . amLODipine (NORVASC) 10 MG tablet Take 1 tablet (10 mg total) by mouth daily. 90 tablet 3  . metoprolol tartrate (LOPRESSOR) 50 MG tablet Take one tablet the night before CT test and take one tablet 2 hours prior to CT test 2 tablet 0   No current facility-administered medications for this visit.     Allergies:   Androgel [testosterone] and Penicillins    Social History:  The patient  reports that he has quit smoking. His smoking use included pipe and cigars. He has never used smokeless tobacco. He reports current alcohol use of about 1.0 standard drinks of alcohol per week. He reports that he does not use drugs.   Family History:  The patient's family history includes  Birth defects in his sister; Cancer in his brother, father, and mother; Diabetes in his brother, maternal grandmother, and mother; Drug abuse in his brother; Early death in his father; Obesity in his son; Stroke in his maternal grandmother; Thyroid disease in his maternal grandmother; Tremor in his maternal grandfather.    ROS:  Please see the history of present illness.   Otherwise, review of systems are positive for none.   All other systems are reviewed and negative.    PHYSICAL EXAM: VS:  BP (!) 148/84   Pulse 72   Ht 5\' 11"  (1.803 m)   Wt 247 lb (112 kg)   BMI 34.45 kg/m  , BMI Body mass index is 34.45 kg/m. Affect appropriate Healthy:  appears stated age 55: normal Neck supple with no adenopathy JVP normal no bruits no thyromegaly Lungs clear with no wheezing and good diaphragmatic motion Heart:  S1/S2 no murmur, no rub, gallop or click PMI normal Abdomen: benighn, BS positve, no tenderness, no AAA no bruit.  No HSM or HJR Distal pulses intact with no bruits No edema Neuro non-focal Skin warm and dry No muscular weakness    EKG:  12/29/17 SR rate 68 normal RSR'   Recent Labs: 12/29/2017: BUN 16; Creat 1.12; Hemoglobin 15.2; Platelets 301; Potassium 3.9; Sodium 140; TSH 4.84    Lipid Panel    Component Value Date/Time   CHOL 213 (H) 11/22/2016 1648   TRIG (H) 11/22/2016 1648    427.0 Triglyceride is over 400; calculations on Lipids are invalid.   HDL 31.10 (L) 11/22/2016 1648   CHOLHDL 7 11/22/2016 1648   VLDL 55.4 (H) 10/27/2008 1605   LDLCALC 94 06/28/2016   LDLDIRECT 116.0 11/22/2016 1648      Wt Readings from Last 3 Encounters:  01/29/18 247 lb (112 kg)  01/08/18 249 lb (112.9 kg)  12/29/17 249 lb (112.9 kg)      Other studies Reviewed: Additional studies/ records that were reviewed today include: Notes from primary , labs, CXR and ECG .    ASSESSMENT AND PLAN:  1.  Chest Pain:  With non diagnostic ETT have ordered cardiac CT BMET today  for dye  2. HTN:  Started on norvasc HTN response to exercise f/u primary may need to add 2nd Drug in afternoon told to take norvasc in am  3. Smoking: previous CXR October 2019 ok 4. Thyroid:  F/u primary consider checking free T3 and T4 given mildly elevated TSH   Current medicines are reviewed at length with the patient today.  The patient does not have concerns regarding medicines.  The following changes have been made:  None  Labs/ tests ordered today include: BMET Cardiac CT  Orders Placed This Encounter  Procedures  . CT CORONARY MORPH W/CTA COR W/SCORE W/CA W/CM &/OR WO/CM  . CT CORONARY FRACTIONAL FLOW RESERVE DATA PREP  . CT CORONARY FRACTIONAL FLOW RESERVE FLUID ANALYSIS  . Basic metabolic panel     Disposition:   FU with cardiology PRN      Signed, Jenkins Rouge, MD  01/29/2018 3:20 PM    [facility] [place], [place], [place]  [address] Phone: [phone]; Fax: [phone]

## 2018-01-29 ENCOUNTER — Ambulatory Visit: Payer: PPO | Admitting: Cardiovascular Disease

## 2018-01-29 ENCOUNTER — Other Ambulatory Visit: Payer: Self-pay | Admitting: Cardiovascular Disease

## 2018-01-29 ENCOUNTER — Encounter (INDEPENDENT_AMBULATORY_CARE_PROVIDER_SITE_OTHER): Payer: Self-pay

## 2018-01-29 VITALS — BP 148/84 | HR 72 | Ht 71.0 in | Wt 247.0 lb

## 2018-01-29 DIAGNOSIS — I10 Essential (primary) hypertension: Secondary | ICD-10-CM | POA: Diagnosis not present

## 2018-01-29 DIAGNOSIS — M1711 Unilateral primary osteoarthritis, right knee: Secondary | ICD-10-CM | POA: Diagnosis not present

## 2018-01-29 DIAGNOSIS — R079 Chest pain, unspecified: Secondary | ICD-10-CM | POA: Diagnosis not present

## 2018-01-29 MED ORDER — METOPROLOL TARTRATE 50 MG PO TABS: ORAL_TABLET | ORAL | 0 refills | Status: DC

## 2018-01-29 NOTE — Patient Instructions (Signed)
Medication Instructions:   If you need a refill on your cardiac medications before your next appointment, please call your pharmacy.   Lab work: Your physician recommends that you return for lab work in: 1 week for BMET  If you have labs (blood work) drawn today and your tests are completely normal, you will receive your results only by: Marland Kitchen MyChart Message (if you have MyChart) OR . A paper copy in the mail If you have any lab test that is abnormal or we need to change your treatment, we will call you to review the results.  Testing/Procedures: Your physician has requested that you have cardiac CT. Cardiac computed tomography (CT) is a painless test that uses an x-ray machine to take clear, detailed pictures of your heart. For further information please visit [facility]. Please follow instruction sheet as given.  Follow-Up: At [facility], you and your health needs are our priority.  As part of our continuing mission to provide you with exceptional heart care, we have created designated Provider Care Teams.  These Care Teams include your primary Cardiologist (physician) and Advanced Practice Providers (APPs -  Physician Assistants and Nurse Practitioners) who all work together to provide you with the care you need, when you need it. Your physician recommends that you schedule a follow-up appointment as needed with Dr. Johnsie Cancel

## 2018-02-05 ENCOUNTER — Other Ambulatory Visit: Payer: PPO

## 2018-02-05 DIAGNOSIS — I10 Essential (primary) hypertension: Secondary | ICD-10-CM | POA: Diagnosis not present

## 2018-02-05 DIAGNOSIS — R079 Chest pain, unspecified: Secondary | ICD-10-CM

## 2018-02-06 LAB — BASIC METABOLIC PANEL
BUN/Creatinine Ratio: 14 (ref 10–24)
BUN: 17 mg/dL (ref 8–27)
CO2: 24 mmol/L (ref 20–29)
Calcium: 9.1 mg/dL (ref 8.6–10.2)
Chloride: 103 mmol/L (ref 96–106)
Creatinine, Ser: 1.2 mg/dL (ref 0.76–1.27)
GFR calc Af Amer: 72 mL/min/{1.73_m2} (ref >59)
GFR calc non Af Amer: 62 mL/min/{1.73_m2} (ref >59)
Glucose: 104 mg/dL — ABNORMAL HIGH (ref 65–99)
Potassium: 4.4 mmol/L (ref 3.5–5.2)
Sodium: 143 mmol/L (ref 134–144)

## 2018-02-08 DIAGNOSIS — M1711 Unilateral primary osteoarthritis, right knee: Secondary | ICD-10-CM | POA: Diagnosis not present

## 2018-02-16 ENCOUNTER — Other Ambulatory Visit: Payer: Self-pay

## 2018-02-16 DIAGNOSIS — M1711 Unilateral primary osteoarthritis, right knee: Secondary | ICD-10-CM | POA: Diagnosis not present

## 2018-02-16 DIAGNOSIS — Z01812 Encounter for preprocedural laboratory examination: Secondary | ICD-10-CM

## 2018-02-16 DIAGNOSIS — Z01818 Encounter for other preprocedural examination: Secondary | ICD-10-CM

## 2018-02-16 DIAGNOSIS — R079 Chest pain, unspecified: Secondary | ICD-10-CM

## 2018-02-16 NOTE — Progress Notes (Signed)
Placed order for BMET for CT in March.

## 2018-02-23 DIAGNOSIS — M1711 Unilateral primary osteoarthritis, right knee: Secondary | ICD-10-CM | POA: Diagnosis not present

## 2018-03-16 DIAGNOSIS — M1711 Unilateral primary osteoarthritis, right knee: Secondary | ICD-10-CM | POA: Diagnosis not present

## 2018-03-21 ENCOUNTER — Encounter (HOSPITAL_COMMUNITY): Payer: Self-pay | Admitting: Emergency Medicine

## 2018-03-21 ENCOUNTER — Ambulatory Visit (INDEPENDENT_AMBULATORY_CARE_PROVIDER_SITE_OTHER): Payer: PPO

## 2018-03-21 ENCOUNTER — Encounter: Payer: Self-pay | Admitting: Physician Assistant

## 2018-03-21 ENCOUNTER — Emergency Department (HOSPITAL_COMMUNITY)
Admission: EM | Admit: 2018-03-21 | Discharge: 2018-03-22 | Disposition: A | Discharge status: Home / Self Care | Payer: PPO | Attending: Emergency Medicine | Admitting: Emergency Medicine

## 2018-03-21 ENCOUNTER — Ambulatory Visit (INDEPENDENT_AMBULATORY_CARE_PROVIDER_SITE_OTHER): Payer: PPO | Admitting: Physician Assistant

## 2018-03-21 ENCOUNTER — Other Ambulatory Visit: Payer: Self-pay

## 2018-03-21 VITALS — BP 146/80 | HR 68 | Temp 97.8°F | Ht 71.0 in | Wt 244.0 lb

## 2018-03-21 DIAGNOSIS — R350 Frequency of micturition: Secondary | ICD-10-CM

## 2018-03-21 DIAGNOSIS — R1031 Right lower quadrant pain: Secondary | ICD-10-CM

## 2018-03-21 DIAGNOSIS — Z85828 Personal history of other malignant neoplasm of skin: Secondary | ICD-10-CM | POA: Insufficient documentation

## 2018-03-21 DIAGNOSIS — R109 Unspecified abdominal pain: Secondary | ICD-10-CM | POA: Diagnosis not present

## 2018-03-21 DIAGNOSIS — N200 Calculus of kidney: Secondary | ICD-10-CM | POA: Diagnosis not present

## 2018-03-21 DIAGNOSIS — I10 Essential (primary) hypertension: Secondary | ICD-10-CM | POA: Insufficient documentation

## 2018-03-21 DIAGNOSIS — R1032 Left lower quadrant pain: Secondary | ICD-10-CM | POA: Diagnosis present

## 2018-03-21 DIAGNOSIS — Z87891 Personal history of nicotine dependence: Secondary | ICD-10-CM | POA: Diagnosis not present

## 2018-03-21 DIAGNOSIS — Z79899 Other long term (current) drug therapy: Secondary | ICD-10-CM | POA: Diagnosis not present

## 2018-03-21 DIAGNOSIS — N132 Hydronephrosis with renal and ureteral calculous obstruction: Secondary | ICD-10-CM | POA: Diagnosis not present

## 2018-03-21 DIAGNOSIS — M545 Low back pain: Secondary | ICD-10-CM | POA: Diagnosis not present

## 2018-03-21 DIAGNOSIS — R110 Nausea: Secondary | ICD-10-CM | POA: Diagnosis not present

## 2018-03-21 LAB — CBC
HCT: 48.4 % (ref 39.0–52.0)
Hemoglobin: 15.5 g/dL (ref 13.0–17.0)
MCH: 27.5 pg (ref 26.0–34.0)
MCHC: 32 g/dL (ref 30.0–36.0)
MCV: 86 fL (ref 80.0–100.0)
Platelets: 298 10*3/uL (ref 150–400)
RBC: 5.63 MIL/uL (ref 4.22–5.81)
RDW: 14.6 % (ref 11.5–15.5)
WBC: 8.4 10*3/uL (ref 4.0–10.5)
nRBC: 0 % (ref 0.0–0.2)

## 2018-03-21 LAB — POCT URINALYSIS DIPSTICK
Bilirubin, UA: NEGATIVE
Blood, UA: POSITIVE
Glucose, UA: NEGATIVE
Ketones, UA: NEGATIVE
Nitrite, UA: NEGATIVE
Protein, UA: NEGATIVE
Spec Grav, UA: >=1.03 — AB (ref 1.010–1.025)
Urobilinogen, UA: 0.2 E.U./dL
pH, UA: 6 (ref 5.0–8.0)

## 2018-03-21 LAB — COMPREHENSIVE METABOLIC PANEL
ALT: 25 U/L (ref 0–44)
AST: 22 U/L (ref 15–41)
Albumin: 3.8 g/dL (ref 3.5–5.0)
Alkaline Phosphatase: 98 U/L (ref 38–126)
Anion gap: 8 (ref 5–15)
BUN: 17 mg/dL (ref 8–23)
CO2: 26 mmol/L (ref 22–32)
Calcium: 9.1 mg/dL (ref 8.9–10.3)
Chloride: 104 mmol/L (ref 98–111)
Creatinine, Ser: 1.2 mg/dL (ref 0.61–1.24)
GFR calc Af Amer: >60 mL/min (ref >60)
GFR calc non Af Amer: >60 mL/min (ref >60)
Glucose, Bld: 98 mg/dL (ref 70–99)
Potassium: 4.2 mmol/L (ref 3.5–5.1)
Sodium: 138 mmol/L (ref 135–145)
Total Bilirubin: 0.8 mg/dL (ref 0.3–1.2)
Total Protein: 7 g/dL (ref 6.5–8.1)

## 2018-03-21 LAB — LIPASE, BLOOD: Lipase: 31 U/L (ref 11–51)

## 2018-03-21 MED ORDER — SODIUM CHLORIDE 0.9% FLUSH: 3.0000 mL | Freq: Once | INTRAVENOUS | Status: DC

## 2018-03-21 NOTE — Progress Notes (Addendum)
Dale Wood is a 68 y.o. male here for a follow up of a pre-existing problem.  I acted as a Education administrator for [employer], PA-C Anselmo Pickler, LPN  History of Present Illness:   Chief Complaint  Patient presents with  . RLQ pain    Abdominal Pain  This is a recurrent problem. Episode onset: Started week ago Sunday, thinks Kidney stones. The onset quality is sudden. Episode frequency: Having episodes off and on. Duration: 20-30 mniutes. The problem has been waxing and waning. The pain is located in the RLQ. The patient is experiencing no pain. The quality of the pain is sharp. The abdominal pain does not radiate. Associated symptoms include frequency. Pertinent negatives include no constipation, diarrhea, dysuria, fever, headaches, hematuria, nausea or vomiting. Treatments tried: Ibuprofen and rest. The treatment provided mild relief. Hx Kidney stones    Had a total of 4 "attacks" since symptoms onset -- it overall, does not feel like his kidney stones. Hasn't had a bowel movement in about two days -- this is not unusual for him.     Past Medical History:  Diagnosis Date  . Abdominal pain, epigastric 10/27/2008   Qualifier: Diagnosis of  By: Ronnald Ramp MD, [name].   Marland Kitchen ANKLE INJURY, RIGHT 09/22/2009   Qualifier: Diagnosis of  By: Nathaneil Canary, CMA, [name]    . BASAL CELL CARCINOMA, FACE 10/27/2008   Qualifier: Diagnosis of  By: Ronnald Ramp MD, [name].   Marland Kitchen BASAL CELL CARCINOMA, FACE 10/27/2008   Qualifier: Diagnosis of By: Ronnald Ramp MD, [name].   . Cancer ([place])    Skin Cancer on Nose  . CELLULITIS, FOOT 09/22/2009   Qualifier: Diagnosis of  By: Ronnald Ramp MD, [name].   . Constipation 01/05/2017  . DYSMETABOLIC SYNDROME 9/38/1017   Qualifier: Diagnosis of  By: Ronnald Ramp MD, [place] 10/27/2008   Qualifier: Diagnosis of  By: Ronnald Ramp MD, [name].   . ERECTILE DYSFUNCTION, ORGANIC 10/27/2008   Qualifier: Diagnosis of  By: Ronnald Ramp MD, [name].   . Headache 07/30/2015  . Heart murmur   .  Hyperlipidemia   . HYPERLIPIDEMIA 10/27/2008   Qualifier: Diagnosis of  By: Ronnald Ramp MD, [name].   . Hypertension   . HYPOGONADISM 11/26/2008   Qualifier: Diagnosis of  By: Ronnald Ramp MD, [name].   . Kidney stones   . Low testosterone   . NEPHROLITHIASIS, HX OF 10/27/2008   Qualifier: Diagnosis of  By: Ronnald Ramp MD, [name].   . Nephrolithiasis, uric acid   . OSTEOARTHRITIS 10/27/2008   Qualifier: Diagnosis of  By: Ronnald Ramp MD, [name].   Marland Kitchen Positive TB test   . Postherpetic neuralgia 03/27/2017  . Rheumatic fever   . Rheumatic fever   . SKIN CANCER, HX OF 10/27/2008   Qualifier: Diagnosis of  By: Ronnald Ramp MD, [name].   . Tachycardia 11/09/2017  . Thyroid disease   . Unspecified disorder of kidney and ureter   . URI 02/05/2009   Qualifier: Diagnosis of  By: Ronnald Ramp MD, [name].      Social History   Socioeconomic History  . Marital status: Married    Spouse name: Not on file  . Number of children: Not on file  . Years of education: Not on file  . Highest education level: Not on file  Occupational History  . Not on file  Social Needs  . Financial resource strain: Not on file  . Food insecurity:    Worry: Not on file  Inability: Not on file  . Transportation needs:    Medical: Not on file    Non-medical: Not on file  Tobacco Use  . Smoking status: Former Smoker    Types: Pipe, Landscape architect  . Smokeless tobacco: Never Used  Substance and Sexual Activity  . Alcohol use: Yes    Alcohol/week: 1.0 standard drinks    Types: 1 Cans of beer per week    Comment: occssionally  . Drug use: No  . Sexual activity: Yes  Lifestyle  . Physical activity:    Days per week: Not on file    Minutes per session: Not on file  . Stress: Not on file  Relationships  . Social connections:    Talks on phone: Not on file    Gets together: Not on file    Attends religious service: Not on file    Active member of club or organization: Not on file    Attends meetings of clubs or organizations: Not on  file    Relationship status: Not on file  . Intimate partner violence:    Fear of current or ex partner: Not on file    Emotionally abused: Not on file    Physically abused: Not on file    Forced sexual activity: Not on file  Other Topics Concern  . Not on file  Social History Narrative  . Not on file    Past Surgical History:  Procedure Laterality Date  . KNEE SURGERY Right    2005, 2008  . TONSILLECTOMY AND ADENOIDECTOMY      Family History  Problem Relation Age of Onset  . Cancer Mother   . Diabetes Mother   . Cancer Father   . Early death Father   . Diabetes Brother   . Cancer Brother        prostate  . Drug abuse Brother   . Birth defects Sister   . Diabetes Maternal Grandmother   . Thyroid disease Maternal Grandmother   . Stroke Maternal Grandmother   . Tremor Maternal Grandfather   . Obesity Son     Allergies  Allergen Reactions  . Androgel [Testosterone]     *Pains in Chest/Axillary area*  . Penicillins     *Does Not Work*    Current Medications:   Current Outpatient Medications:  .  amLODipine (NORVASC) 10 MG tablet, Take 1 tablet (10 mg total) by mouth daily., Disp: 90 tablet, Rfl: 3 .  metoprolol tartrate (LOPRESSOR) 50 MG tablet, Take one tablet the night before CT test and take one tablet 2 hours prior to CT test, Disp: 2 tablet, Rfl: 0   Review of Systems:   Review of Systems  Constitutional: Negative for fever.  Gastrointestinal: Positive for abdominal pain. Negative for constipation, diarrhea, nausea and vomiting.  Genitourinary: Positive for frequency. Negative for dysuria and hematuria.  Neurological: Negative for headaches.    Vitals:   Vitals:   03/21/18 1452  BP: (!) 146/80  Pulse: 68  Temp: 97.8 F (36.6 C)  TempSrc: Oral  SpO2: 97%  Weight: 244 lb (110.7 kg)  Height: 5\' 11"  (1.803 m)     Body mass index is 34.03 kg/m.  Physical Exam:   Physical Exam Vitals signs and nursing note reviewed.  Constitutional:       General: He is not in acute distress.    Appearance: He is well-developed. He is not ill-appearing or toxic-appearing.  Cardiovascular:     Rate and Rhythm: Normal rate and regular rhythm.  Pulses: Normal pulses.     Heart sounds: Normal heart sounds, S1 normal and S2 normal.     Comments: No LE edema Pulmonary:     Effort: Pulmonary effort is normal.     Breath sounds: Normal breath sounds.  Abdominal:     General: Abdomen is flat. Bowel sounds are normal.     Palpations: Abdomen is soft.     Tenderness: There is abdominal tenderness in the right lower quadrant. There is no right CVA tenderness or left CVA tenderness. Positive signs include McBurney's sign (significantly tender).     Comments: Umbilical hernia present  Skin:    General: Skin is warm and dry.  Neurological:     Mental Status: He is alert.     GCS: GCS eye subscore is 4. GCS verbal subscore is 5. GCS motor subscore is 6.  Psychiatric:        Speech: Speech normal.        Behavior: Behavior normal. Behavior is cooperative.     Results for orders placed or performed in visit on 03/21/18  POCT urinalysis dipstick  Result Value Ref Range   Color, UA yellow    Clarity, UA clear    Glucose, UA Negative Negative   Bilirubin, UA Negative    Ketones, UA Negative    Spec Grav, UA >=1.030 (A) 1.010 - 1.025   Blood, UA positive    pH, UA 6.0 5.0 - 8.0   Protein, UA Negative Negative   Urobilinogen, UA 0.2 0.2 or 1.0 E.U./dL   Nitrite, UA Negative    Leukocytes, UA Small (1+) (A) Negative   Appearance     Odor      Assessment and Plan:   Caillou was seen today for rlq pain.  Diagnoses and all orders for this visit:  Urinary frequency; Right lower quadrant abdominal pain Urine with evidence of blood and small leuks. Possible stone, however we cannot rule out appendicitis as he is point tender in RLQ. He is traveling to [place] this weekend and needs evaluation urgently, he is agreeable going to the ER. X-ray abd  pending. -     POCT urinalysis dipstick -     DG Abd 2 Views; Future  . Reviewed expectations re: course of current medical issues. . Discussed self-management of symptoms. . Outlined signs and symptoms indicating need for more acute intervention. . Patient verbalized understanding and all questions were answered. . See orders for this visit as documented in the electronic medical record. . Patient received an After-Visit Summary.  CMA or LPN served as scribe during this visit. History, Physical, and Plan performed by medical provider. The above documentation has been reviewed and is accurate and complete.  Inda Coke, PA-C

## 2018-03-21 NOTE — Patient Instructions (Addendum)
It was great to see you!  Please go to the ER for further work-up of your abdominal pain.  Take care,  Inda Coke PA-C

## 2018-03-21 NOTE — ED Triage Notes (Signed)
Pt. Stated, Ive had RLQ  For a week ago. Went to my Dr. [name] on [facility] and she said to come here to evaluation ? Appendix.

## 2018-03-22 ENCOUNTER — Emergency Department (HOSPITAL_COMMUNITY): Payer: PPO

## 2018-03-22 ENCOUNTER — Encounter (HOSPITAL_COMMUNITY): Payer: Self-pay | Admitting: Radiology

## 2018-03-22 ENCOUNTER — Other Ambulatory Visit: Payer: Self-pay

## 2018-03-22 DIAGNOSIS — N132 Hydronephrosis with renal and ureteral calculous obstruction: Secondary | ICD-10-CM | POA: Diagnosis not present

## 2018-03-22 LAB — URINALYSIS, ROUTINE W REFLEX MICROSCOPIC
Bacteria, UA: NONE SEEN
Bilirubin Urine: NEGATIVE
Glucose, UA: NEGATIVE mg/dL
Ketones, ur: NEGATIVE mg/dL
Leukocytes,Ua: NEGATIVE
Nitrite: NEGATIVE
Protein, ur: NEGATIVE mg/dL
Specific Gravity, Urine: 1.016 (ref 1.005–1.030)
pH: 6 (ref 5.0–8.0)

## 2018-03-22 MED ORDER — HYDROCODONE-ACETAMINOPHEN 5-325 MG PO TABS: 0.5000 | ORAL_TABLET | Freq: Four times a day (QID) | ORAL | 0 refills | Status: DC | PRN

## 2018-03-22 MED ORDER — IOHEXOL 300 MG/ML  SOLN
100.0000 mL | Freq: Once | INTRAMUSCULAR | Status: AC | PRN
  Administered 2018-03-22: 100 mL via INTRAVENOUS

## 2018-03-22 MED ORDER — TAMSULOSIN HCL 0.4 MG PO CAPS: 0.4000 mg | ORAL_CAPSULE | Freq: Every day | ORAL | 0 refills | Status: DC

## 2018-03-22 MED ORDER — ONDANSETRON 4 MG PO TBDP: 4.0000 mg | ORAL_TABLET | Freq: Three times a day (TID) | ORAL | 0 refills | Status: DC | PRN

## 2018-03-22 MED ORDER — IBUPROFEN 600 MG PO TABS: 600.0000 mg | ORAL_TABLET | Freq: Four times a day (QID) | ORAL | 0 refills | Status: DC | PRN

## 2018-03-22 MED ORDER — IBUPROFEN 800 MG PO TABS
800.0000 mg | ORAL_TABLET | Freq: Once | ORAL | Status: AC
  Administered 2018-03-22: 800 mg via ORAL
  Filled 2018-03-22: qty 1

## 2018-03-22 NOTE — Discharge Instructions (Signed)
Your CT scan today showed a 5 x 6 mm kidney stone in your right ureter.  Your blood tests have been reassuring.  Take Flomax as prescribed to promote stone movement. We recommend ibuprofen use every 6 hours for management of pain.  If your pain is severe, you may take Norco as prescribed.  Do not drive or drink alcohol after taking this medication as it may make you drowsy and impair your judgment.  You may use Zofran if you develop nausea or vomiting.  We recommend that you schedule a follow-up visit with [facility].  Return to the ED for new or concerning symptoms.

## 2018-03-22 NOTE — ED Provider Notes (Signed)
[facility] EMERGENCY DEPARTMENT Provider Note   CSN: [id number] Arrival date & time: 03/21/18  1735    History   Chief Complaint Chief Complaint  Patient presents with  . Abdominal Pain    HPI Dale Wood is a 68 y.o. male.     68 year old male presents to the emergency department for evaluation of abdominal pain.  He has been experiencing some pain in his right low back and side for the past week.  Pain has been intermittent, waxing and waning in severity.  He states that the type of pain he has been experiencing changed during the day today.  He has been feeling a burning discomfort in his left lower quadrant.  Symptoms over the past week of been associated with nausea.  No vomiting, fevers, bowel changes.  He went to urgent care today and was told that he had red blood cells and bacteria in his urine, but was advised to come to the ED for rule out appendicitis given right lower quadrant tenderness on exam.  He does have a history of prior kidney stones.  No history of abdominal surgeries.  The history is provided by the patient. No language interpreter was used.  Abdominal Pain    Past Medical History:  Diagnosis Date  . Abdominal pain, epigastric 10/27/2008   Qualifier: Diagnosis of  By: Ronnald Ramp MD, [name].   Marland Kitchen ANKLE INJURY, RIGHT 09/22/2009   Qualifier: Diagnosis of  By: Nathaneil Canary, CMA, [name]    . BASAL CELL CARCINOMA, FACE 10/27/2008   Qualifier: Diagnosis of  By: Ronnald Ramp MD, [name].   Marland Kitchen BASAL CELL CARCINOMA, FACE 10/27/2008   Qualifier: Diagnosis of By: Ronnald Ramp MD, [name].   . Cancer ([place])    Skin Cancer on Nose  . CELLULITIS, FOOT 09/22/2009   Qualifier: Diagnosis of  By: Ronnald Ramp MD, [name].   . Constipation 01/05/2017  . DYSMETABOLIC SYNDROME 04/19/4740   Qualifier: Diagnosis of  By: Ronnald Ramp MD, [place] 10/27/2008   Qualifier: Diagnosis of  By: Ronnald Ramp MD, [name].   . ERECTILE DYSFUNCTION, ORGANIC 10/27/2008   Qualifier: Diagnosis  of  By: Ronnald Ramp MD, [name].   . Headache 07/30/2015  . Heart murmur   . Hyperlipidemia   . HYPERLIPIDEMIA 10/27/2008   Qualifier: Diagnosis of  By: Ronnald Ramp MD, [name].   . Hypertension   . HYPOGONADISM 11/26/2008   Qualifier: Diagnosis of  By: Ronnald Ramp MD, [name].   . Kidney stones   . Low testosterone   . NEPHROLITHIASIS, HX OF 10/27/2008   Qualifier: Diagnosis of  By: Ronnald Ramp MD, [name].   . Nephrolithiasis, uric acid   . OSTEOARTHRITIS 10/27/2008   Qualifier: Diagnosis of  By: Ronnald Ramp MD, [name].   Marland Kitchen Positive TB test   . Postherpetic neuralgia 03/27/2017  . Rheumatic fever   . Rheumatic fever   . SKIN CANCER, HX OF 10/27/2008   Qualifier: Diagnosis of  By: Ronnald Ramp MD, [name].   . Tachycardia 11/09/2017  . Thyroid disease   . Unspecified disorder of kidney and ureter   . URI 02/05/2009   Qualifier: Diagnosis of  By: Ronnald Ramp MD, [name].     Patient Active Problem List   Diagnosis Date Noted  . Tachycardia 11/09/2017  . Postherpetic neuralgia 03/27/2017  . Constipation 01/05/2017  . Hyperlipidemia 01/05/2017  . Hypertension 01/05/2017  . Headache 07/30/2015  . ANKLE INJURY, RIGHT 09/22/2009  . HYPERLIPIDEMIA 10/27/2008  .  ERECTILE DYSFUNCTION, ORGANIC 10/27/2008  . OSTEOARTHRITIS 10/27/2008  . ABDOMINAL PAIN, EPIGASTRIC 10/27/2008  . SKIN CANCER, HX OF 10/27/2008  . NEPHROLITHIASIS, HX OF 10/27/2008  . BASAL CELL CARCINOMA, FACE 10/27/2008    Past Surgical History:  Procedure Laterality Date  . KNEE SURGERY Right    2005, 2008  . TONSILLECTOMY AND ADENOIDECTOMY          Home Medications    Prior to Admission medications   Medication Sig Start Date End Date Taking? Authorizing Provider  amLODipine (NORVASC) 10 MG tablet Take 1 tablet (10 mg total) by mouth daily. 12/29/17  Yes Vivi Barrack, MD  HYDROcodone-acetaminophen (NORCO/VICODIN) 5-325 MG tablet Take 0.5-2 tablets by mouth every 6 (six) hours as needed for moderate pain or severe pain. 03/22/18   Antonietta Breach, PA-C  ibuprofen (ADVIL,MOTRIN) 600 MG tablet Take 1 tablet (600 mg total) by mouth every 6 (six) hours as needed. 03/22/18   Antonietta Breach, PA-C  metoprolol tartrate (LOPRESSOR) 50 MG tablet Take one tablet the night before CT test and take one tablet 2 hours prior to CT test Patient not taking: Reported on 03/22/2018 01/29/18   Josue Hector, MD  ondansetron (ZOFRAN ODT) 4 MG disintegrating tablet Take 1 tablet (4 mg total) by mouth every 8 (eight) hours as needed for nausea or vomiting. 03/22/18   Antonietta Breach, PA-C  tamsulosin (FLOMAX) 0.4 MG CAPS capsule Take 1 capsule (0.4 mg total) by mouth daily. 03/22/18   Antonietta Breach, PA-C    Family History Family History  Problem Relation Age of Onset  . Cancer Mother   . Diabetes Mother   . Cancer Father   . Early death Father   . Diabetes Brother   . Cancer Brother        prostate  . Drug abuse Brother   . Birth defects Sister   . Diabetes Maternal Grandmother   . Thyroid disease Maternal Grandmother   . Stroke Maternal Grandmother   . Tremor Maternal Grandfather   . Obesity Son     Social History Social History   Tobacco Use  . Smoking status: Former Smoker    Types: Pipe, Landscape architect  . Smokeless tobacco: Never Used  Substance Use Topics  . Alcohol use: Yes    Alcohol/week: 1.0 standard drinks    Types: 1 Cans of beer per week    Comment: occssionally  . Drug use: No     Allergies   Androgel [testosterone] and Penicillins   Review of Systems Review of Systems  Gastrointestinal: Positive for abdominal pain.  Ten systems reviewed and are negative for acute change, except as noted in the HPI.    Physical Exam Updated Vital Signs BP (!) 145/82   Pulse 68   Temp (!) 97.5 F (36.4 C) (Oral)   Resp 20   Ht 5' 11.75" (1.822 m)   Wt 109.3 kg   SpO2 96%   BMI 32.91 kg/m   Physical Exam Vitals signs and nursing note reviewed.  Constitutional:      General: He is not in acute distress.    Appearance: He is  well-developed. He is not diaphoretic.     Comments: Nontoxic appearing and in NAD  HENT:     Head: Normocephalic and atraumatic.  Eyes:     General: No scleral icterus.    Conjunctiva/sclera: Conjunctivae normal.  Neck:     Musculoskeletal: Normal range of motion.  Cardiovascular:     Rate and Rhythm: Normal rate  and regular rhythm.     Pulses: Normal pulses.  Pulmonary:     Effort: Pulmonary effort is normal. No respiratory distress.     Breath sounds: No stridor. No wheezing or rales.     Comments: Respirations even and unlabored Abdominal:     Comments: Soft, obese abdomen with no focal TTP. No peritoneal signs.  Musculoskeletal: Normal range of motion.  Skin:    General: Skin is warm and dry.     Coloration: Skin is not pale.     Findings: No erythema or rash.  Neurological:     Mental Status: He is alert and oriented to person, place, and time.  Psychiatric:        Behavior: Behavior normal.      ED Treatments / Results  Labs (all labs ordered are listed, but only abnormal results are displayed) Labs Reviewed  URINALYSIS, ROUTINE W REFLEX MICROSCOPIC - Abnormal; Notable for the following components:      Result Value   Hgb urine dipstick MODERATE (*)    All other components within normal limits  LIPASE, BLOOD  COMPREHENSIVE METABOLIC PANEL  CBC    EKG None  Radiology Ct Abdomen Pelvis W Contrast  Result Date: 03/22/2018 CLINICAL DATA:  Abdominal pain. Patient reports right lower quadrant abdominal pain for 1 week. EXAM: CT ABDOMEN AND PELVIS WITH CONTRAST TECHNIQUE: Multidetector CT imaging of the abdomen and pelvis was performed using the standard protocol following bolus administration of intravenous contrast. CONTRAST:  155mL OMNIPAQUE IOHEXOL 300 MG/ML  SOLN COMPARISON:  Radiograph yesterday. CT 08/30/2017 FINDINGS: Lower chest: Minor lung base atelectasis. Mild elevation of left hemidiaphragm. Hepatobiliary: No focal hepatic abnormality. Punctate gallstone  within the gallbladder without cholecystic inflammation or biliary dilatation. Pancreas: No ductal dilatation or inflammation. Spleen: Normal in size without focal abnormality. Adrenals/Urinary Tract: Normal adrenal glands. Obstructing 5 x 6 mm stone in the distal right ureter with mild proximal hydroureteronephrosis. Diminished right renal excretion on delayed phase imaging. Only minimal perinephric edema about the right kidney, which is similar to the left. No left hydronephrosis or hydroureter. Urinary bladder is physiologically distended without wall thickening. No bladder stone. Stomach/Bowel: Stomach is within normal limits. Appendix appears normal. No evidence of bowel wall thickening, distention, or inflammatory changes. Vascular/Lymphatic: Noncalcified atheromatous plaque in the distal abdominal aorta causing mild (approximately 30%) luminal narrowing the level of the IMA. No adenopathy. Reproductive: Enlarged prostate gland 5.2 cm transverse. Other: Fat within both inguinal canals. Small fat containing umbilical hernia. No free air or free fluid. Musculoskeletal: There are no acute or suspicious osseous abnormalities. IMPRESSION: 1. Obstructing 5 x 6 mm stone in the distal right ureter with mild hydroureteronephrosis. 2. Incidental findings of cholelithiasis, enlarged prostate gland, and small fat containing umbilical hernia. Electronically Signed   By: Keith Rake M.D.   On: 03/22/2018 02:26    Procedures Procedures (including critical care time)  Medications Ordered in ED Medications  sodium chloride flush (NS) 0.9 % injection 3 mL (has no administration in time range)  ibuprofen (ADVIL,MOTRIN) tablet 800 mg (800 mg Oral Given 03/22/18 0114)  iohexol (OMNIPAQUE) 300 MG/ML solution 100 mL (100 mLs Intravenous Contrast Given 03/22/18 0138)    2:54 AM [place] substance database reviewed.  Last narcotic prescription filled in early 2019.   Initial Impression / Assessment and Plan /  ED Course  I have reviewed the triage vital signs and the nursing notes.  Pertinent labs & imaging results that were available during my care of  the patient were reviewed by me and considered in my medical decision making (see chart for details).        Patien has been diagnosed with a kidney stone via CT. There is no evidence of significant hydronephrosis, serum creatine WNL, vitals sign stable and the pt does not have irratractable vomiting. Patient will be discharged home with pain medications & has been advised to follow up with [facility].  Return precautions discussed and provided. Patient discharged in stable condition with no unaddressed concerns.    Final Clinical Impressions(s) / ED Diagnoses   Final diagnoses:  Kidney stone on right side    ED Discharge Orders         Ordered    ibuprofen (ADVIL,MOTRIN) 600 MG tablet  Every 6 hours PRN     03/22/18 0252    tamsulosin (FLOMAX) 0.4 MG CAPS capsule  Daily     03/22/18 0252    ondansetron (ZOFRAN ODT) 4 MG disintegrating tablet  Every 8 hours PRN     03/22/18 0252    HYDROcodone-acetaminophen (NORCO/VICODIN) 5-325 MG tablet  Every 6 hours PRN     03/22/18 0252           Antonietta Breach, PA-C 03/22/18 0255    Ezequiel Essex, MD 03/22/18 [phone]

## 2018-03-22 NOTE — ED Notes (Signed)
Patient transported to CT 

## 2018-04-10 ENCOUNTER — Other Ambulatory Visit: Payer: PPO

## 2018-04-16 ENCOUNTER — Ambulatory Visit (HOSPITAL_COMMUNITY): Payer: PPO

## 2018-05-07 ENCOUNTER — Other Ambulatory Visit: Payer: PPO

## 2018-05-11 ENCOUNTER — Ambulatory Visit (HOSPITAL_COMMUNITY): Payer: PPO

## 2018-05-29 ENCOUNTER — Telehealth: Payer: Self-pay | Admitting: Cardiovascular Disease

## 2018-05-29 NOTE — Telephone Encounter (Signed)
New Message:   Patient calling concering a appt he is to have on Monday and he not yet received any instructions on what he need to do. Also patient states he is supposed to have some lab. Please call patient back.

## 2018-05-29 NOTE — Telephone Encounter (Signed)
Patient having lab work tomorrow for [place]. Informed patient that Clarise Cruz would be calling him with all his instructions later this week. Patient stated he lost the letter that was given to him in January, but he still has the metoprolol to take the night before and the day of. Will forward to [place].

## 2018-05-30 ENCOUNTER — Other Ambulatory Visit: Payer: PPO | Admitting: *Deleted

## 2018-05-30 ENCOUNTER — Other Ambulatory Visit: Payer: Self-pay

## 2018-05-30 DIAGNOSIS — R079 Chest pain, unspecified: Secondary | ICD-10-CM | POA: Diagnosis not present

## 2018-05-30 DIAGNOSIS — Z01812 Encounter for preprocedural laboratory examination: Secondary | ICD-10-CM | POA: Diagnosis not present

## 2018-05-31 LAB — BASIC METABOLIC PANEL
BUN/Creatinine Ratio: 16 (ref 10–24)
BUN: 19 mg/dL (ref 8–27)
CO2: 24 mmol/L (ref 20–29)
Calcium: 9.4 mg/dL (ref 8.6–10.2)
Chloride: 107 mmol/L — ABNORMAL HIGH (ref 96–106)
Creatinine, Ser: 1.2 mg/dL (ref 0.76–1.27)
GFR calc Af Amer: 72 mL/min/{1.73_m2} (ref >59)
GFR calc non Af Amer: 62 mL/min/{1.73_m2} (ref >59)
Glucose: 79 mg/dL (ref 65–99)
Potassium: 4.1 mmol/L (ref 3.5–5.2)
Sodium: 143 mmol/L (ref 134–144)

## 2018-06-01 ENCOUNTER — Telehealth: Payer: Self-pay | Admitting: Cardiovascular Disease

## 2018-06-01 NOTE — Telephone Encounter (Signed)
Called patient back with lab results. BMET was normal

## 2018-06-01 NOTE — Telephone Encounter (Signed)
Patient returned call for lab results.  

## 2018-06-04 ENCOUNTER — Ambulatory Visit (HOSPITAL_COMMUNITY): Payer: PPO

## 2018-06-12 ENCOUNTER — Telehealth (HOSPITAL_COMMUNITY): Payer: Self-pay | Admitting: Emergency Medicine

## 2018-06-12 NOTE — Telephone Encounter (Signed)
Reaching out to patient to offer assistance regarding upcoming cardiac imaging study; pt verbalizes understanding of appt date/time, parking situation and where to check in, pre-test NPO status and medications ordered, and verified current allergies; name and call back number provided for further questions should they arise Marchia Bond RN [facility] [phone] office [phone] cell  Denies covid symptoms, verbalized understanding of visitor policy

## 2018-06-13 ENCOUNTER — Ambulatory Visit (HOSPITAL_COMMUNITY)
Admission: RE | Admit: 2018-06-13 | Discharge: 2018-06-13 | Disposition: A | Discharge status: Home / Self Care | Payer: PPO | Source: Ambulatory Visit | Attending: Cardiovascular Disease | Admitting: Cardiovascular Disease

## 2018-06-13 ENCOUNTER — Other Ambulatory Visit: Payer: Self-pay

## 2018-06-13 ENCOUNTER — Ambulatory Visit (HOSPITAL_COMMUNITY): Payer: PPO

## 2018-06-13 DIAGNOSIS — I10 Essential (primary) hypertension: Secondary | ICD-10-CM | POA: Diagnosis not present

## 2018-06-13 DIAGNOSIS — R079 Chest pain, unspecified: Secondary | ICD-10-CM | POA: Insufficient documentation

## 2018-06-13 MED ORDER — IOHEXOL 350 MG/ML SOLN
100.0000 mL | Freq: Once | INTRAVENOUS | Status: AC | PRN
  Administered 2018-06-13: 100 mL via INTRAVENOUS

## 2018-06-13 MED ORDER — NITROGLYCERIN 0.4 MG SL SUBL
SUBLINGUAL_TABLET | SUBLINGUAL | Status: AC
  Filled 2018-06-13: qty 2

## 2018-06-13 MED ORDER — NITROGLYCERIN 0.4 MG SL SUBL
0.8000 mg | SUBLINGUAL_TABLET | Freq: Once | SUBLINGUAL | Status: AC
  Administered 2018-06-13: 0.8 mg via SUBLINGUAL
  Filled 2018-06-13: qty 25

## 2018-06-14 ENCOUNTER — Telehealth: Payer: Self-pay

## 2018-06-14 DIAGNOSIS — R0600 Dyspnea, unspecified: Secondary | ICD-10-CM

## 2018-06-14 NOTE — Telephone Encounter (Signed)
Patient agreed to do echo and see Dr. Johnsie Cancel after test. Will place order for echo and have scheduling call to make appointment.

## 2018-06-14 NOTE — Telephone Encounter (Signed)
-----   Message from Josue Hector, MD sent at 06/14/2018  9:23 AM EDT ----- Can f/u echo for dyspnea if he wants and see me in office after test

## 2018-06-29 ENCOUNTER — Telehealth (HOSPITAL_COMMUNITY): Payer: Self-pay | Admitting: Radiology

## 2018-06-29 NOTE — Telephone Encounter (Signed)
Left message with echo instructions.

## 2018-07-02 ENCOUNTER — Ambulatory Visit (HOSPITAL_COMMUNITY): Payer: PPO | Attending: Cardiovascular Disease

## 2018-07-02 ENCOUNTER — Other Ambulatory Visit: Payer: Self-pay

## 2018-07-02 DIAGNOSIS — R0600 Dyspnea, unspecified: Secondary | ICD-10-CM | POA: Diagnosis not present

## 2018-07-29 NOTE — Progress Notes (Signed)
Cardiology Office Note   Date:  08/01/2018   ID:  Dale Wood, DOB 1950-12-31, MRN [id number]  PCP:  Vivi Barrack, MD  Cardiologist:   Jenkins Rouge, MD   No chief complaint on file.     History of Present Illness: Dale Wood is a 68 y.o. male first seen 01/29/18  for consultation regarding chest pain Referred by Dr [name]  Reviewed his office note from 01/08/18 Patient had significant HTN and started on norvasc. He complained of episodes of intermittent  Substernal chest pain and dyspnea over last few weeks. Associated with exertion and going up stairs Lasting Minutes then subside Had bronchitis prior to onset and thought this might be related Previous smoker with mild ETOH intake Sent for stress testing Reviewed. 5;40 minutes on treadmill but only reached 79% of PMHR 122 bpm.  HTN response to exercise but no ST changes.   Labs reviewed TSH mildly elevated 4.8 Hct, BUN,CR K ok CXR 11/08/17 during URI NAD   Pain is atypical rest/stress. Lasts minutes crampy. 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 Abdominal pain, epigastric 10/27/2008   Qualifier: Diagnosis of  By: Ronnald Ramp MD, [name].   Marland Kitchen ANKLE INJURY, RIGHT 09/22/2009   Qualifier: Diagnosis of  By: Nathaneil Canary, CMA, [name]    . BASAL CELL CARCINOMA, FACE 10/27/2008   Qualifier: Diagnosis of  By: Ronnald Ramp MD, [name].   Marland Kitchen BASAL CELL CARCINOMA, FACE 10/27/2008   Qualifier: Diagnosis of By: Ronnald Ramp MD, [name].   . Cancer ([place])    Skin Cancer on Nose  .  CELLULITIS, FOOT 09/22/2009   Qualifier: Diagnosis of  By: Ronnald Ramp MD, [name].   . Constipation 01/05/2017  . DYSMETABOLIC SYNDROME 0/63/0160   Qualifier: Diagnosis of  By: Ronnald Ramp MD, [place] 10/27/2008   Qualifier: Diagnosis of  By: Ronnald Ramp MD, [name].   . ERECTILE DYSFUNCTION, ORGANIC 10/27/2008   Qualifier: Diagnosis of  By: Ronnald Ramp MD, [name].   . Headache 07/30/2015  . Heart murmur   . Hyperlipidemia   . HYPERLIPIDEMIA 10/27/2008   Qualifier: Diagnosis of  By: Ronnald Ramp MD, [name].   . Hypertension   . HYPOGONADISM 11/26/2008   Qualifier: Diagnosis of  By: Ronnald Ramp MD, [name].   . Kidney stones   . Low testosterone   . NEPHROLITHIASIS, HX OF 10/27/2008   Qualifier: Diagnosis of  By: Ronnald Ramp MD, [name].   . Nephrolithiasis, uric acid   . OSTEOARTHRITIS 10/27/2008   Qualifier: Diagnosis of  By: Ronnald Ramp MD, [name].   Marland Kitchen Positive TB test   . Postherpetic neuralgia 03/27/2017  . Rheumatic fever   . Rheumatic fever   . SKIN CANCER, HX OF 10/27/2008   Qualifier: Diagnosis of  By: Ronnald Ramp MD, [name].   . Tachycardia 11/09/2017  . Thyroid disease   . Unspecified disorder of kidney and ureter   . URI 02/05/2009   Qualifier: Diagnosis of  ByRonnald Ramp MD, [name].     Past Surgical History:  Procedure Laterality Date  . KNEE SURGERY Right    2005, 2008  . TONSILLECTOMY AND ADENOIDECTOMY       Current Outpatient Medications  Medication Sig Dispense Refill  . amLODipine (NORVASC) 10 MG tablet Take 1 tablet (10 mg total) by mouth daily. 90 tablet 3   No current facility-administered medications for this visit.     Allergies:   Androgel [testosterone] and Penicillins    Social History:  The patient  reports that he has quit smoking. His smoking use included pipe and cigars. He has never used smokeless tobacco. He reports current alcohol use of about 1.0 standard drinks of alcohol per week. He reports that he does not use drugs.   Family History:  The patient's  family history includes Birth defects in his sister; Cancer in his brother, father, and mother; Diabetes in his brother, maternal grandmother, and mother; Drug abuse in his brother; Early death in his father; Obesity in his son; Stroke in his maternal grandmother; Thyroid disease in his maternal grandmother; Tremor in his maternal grandfather.    ROS:  Please see the history of present illness.   Otherwise, review of systems are positive for none.   All other systems are reviewed and negative.    PHYSICAL EXAM: VS:  BP 136/78   Pulse 75   Ht 6' (1.829 m)   Wt 252 lb 9.6 oz (114.6 kg)   SpO2 95%   BMI 34.26 kg/m  , BMI Body mass index is 34.26 kg/m. Affect appropriate Healthy:  appears stated age 35: normal Neck supple with no adenopathy JVP normal no bruits no thyromegaly Lungs clear with no wheezing and good diaphragmatic motion Heart:  S1/S2 no murmur, no rub, gallop or click PMI normal Abdomen: benighn, BS positve, no tenderness, no AAA no bruit.  No HSM or HJR Distal pulses intact with no bruits No edema Neuro non-focal Skin warm and dry No muscular weakness    EKG:  12/29/17 SR rate 68 normal RSR'   Recent Labs: 12/29/2017: TSH 4.84 03/21/2018: ALT 25; Hemoglobin 15.5; Platelets 298 05/30/2018: BUN 19; Creatinine, Ser 1.20; Potassium 4.1; Sodium 143    Lipid Panel    Component Value Date/Time   CHOL 213 (H) 11/22/2016 1648   TRIG (H) 11/22/2016 1648    427.0 Triglyceride is over 400; calculations on Lipids are invalid.   HDL 31.10 (L) 11/22/2016 1648   CHOLHDL 7 11/22/2016 1648   VLDL 55.4 (H) 10/27/2008 1605   LDLCALC 94 06/28/2016   LDLDIRECT 116.0 11/22/2016 1648      Wt Readings from Last 3 Encounters:  08/01/18 252 lb 9.6 oz (114.6 kg)  03/21/18 241 lb (109.3 kg)  03/21/18 244 lb (110.7 kg)      Other studies Reviewed: Additional studies/ records that were reviewed today include: Notes from primary , labs, CXR and ECG .    ASSESSMENT AND  PLAN:  1.  Chest Pain:  Normal cardiac CT calcium score 0 non cardiac  2. HTN:  Started on norvasc last visit much improved discussed weight loss  And diet again   3. Smoking: previous CXR October 2019 ok no lesions on limited lung fields cardiac CT  4. Thyroid:  F/u primary consider checking free T3 and T4 given mildly elevated TSH   Current medicines are reviewed at length with the patient today.  The patient does not have concerns regarding medicines.  The following  changes have been made:  None    No orders of the defined types were placed in this encounter.    Disposition:   FU with cardiology PRN      Signed, Jenkins Rouge, MD  08/01/2018 4:51 PM    [facility] [address], [place], [place]  [address] Phone: [phone]; Fax: [phone]

## 2018-07-31 ENCOUNTER — Telehealth: Payer: Self-pay | Admitting: Cardiovascular Disease

## 2018-07-31 NOTE — Telephone Encounter (Signed)

## 2018-08-01 ENCOUNTER — Ambulatory Visit: Payer: PPO | Admitting: Cardiovascular Disease

## 2018-08-01 ENCOUNTER — Encounter: Payer: Self-pay | Admitting: Cardiovascular Disease

## 2018-08-01 ENCOUNTER — Other Ambulatory Visit: Payer: Self-pay

## 2018-08-01 VITALS — BP 136/78 | HR 75 | Ht 72.0 in | Wt 252.6 lb

## 2018-08-01 DIAGNOSIS — I10 Essential (primary) hypertension: Secondary | ICD-10-CM

## 2018-08-01 NOTE — Patient Instructions (Signed)
Medication Instructions:  No changes If you need a refill on your cardiac medications before your next appointment, please call your pharmacy.   Lab work: none If you have labs (blood work) drawn today and your tests are completely normal, you will receive your results only by: Marland Kitchen MyChart Message (if you have MyChart) OR . A paper copy in the mail If you have any lab test that is abnormal or we need to change your treatment, we will call you to review the results.  Testing/Procedures: none  Follow-Up: as needed with Dr. Johnsie Cancel.  Any Other Special Instructions Will Be Listed Below (If Applicable).

## 2018-09-12 ENCOUNTER — Ambulatory Visit: Payer: PPO

## 2018-09-13 ENCOUNTER — Ambulatory Visit: Payer: PPO

## 2018-09-13 ENCOUNTER — Ambulatory Visit: Payer: PPO | Admitting: Family Medicine

## 2018-09-18 ENCOUNTER — Other Ambulatory Visit: Payer: Self-pay

## 2018-09-18 ENCOUNTER — Encounter: Payer: Self-pay | Admitting: Family Medicine

## 2018-09-18 ENCOUNTER — Ambulatory Visit (INDEPENDENT_AMBULATORY_CARE_PROVIDER_SITE_OTHER): Payer: PPO

## 2018-09-18 VITALS — BP 132/74 | Temp 97.7°F | Ht 72.0 in | Wt 253.2 lb

## 2018-09-18 DIAGNOSIS — Z23 Encounter for immunization: Secondary | ICD-10-CM | POA: Diagnosis not present

## 2018-09-18 DIAGNOSIS — Z0000 Encounter for general adult medical examination without abnormal findings: Secondary | ICD-10-CM

## 2018-09-18 NOTE — Patient Instructions (Signed)
Dale Wood , Thank you for taking time to come for your Medicare Wellness Visit. I appreciate your ongoing commitment to your health goals. Please review the following plan we discussed and let me know if I can assist you in the future.   Screening recommendations/referrals: Colorectal Screening: completed 2012  Vision and Dental Exams: Recommended annual ophthalmology exams for early detection of glaucoma and other disorders of the eye Recommended annual dental exams for proper oral hygiene  Vaccinations: Influenza vaccine:  recommended this fall either at PCP office or through your local pharmacy  Pneumococcal vaccine: up to date last 11/22/16 Tdap vaccine:  Declined Shingles vaccine: Please call your insurance company to determine your out of pocket expense for the Shingrix vaccine. You may receive this vaccine at your local pharmacy.  Advanced directives: Please bring a copy of your POA (Power of Attorney) and/or Living Will to your next appointment.  Goals: Recommend to drink at least 6-8 8oz glasses of water per day.  Next appointment: Please schedule your Annual Wellness Visit with your Nurse Health Advisor in one year.  Preventive Care 16 Years and Older, Male Preventive care refers to lifestyle choices and visits with your health care provider that can promote health and wellness. What does preventive care include?  A yearly physical exam. This is also called an annual well check.  Dental exams once or twice a year.  Routine eye exams. Ask your health care provider how often you should have your eyes checked.  Personal lifestyle choices, including:  Daily care of your teeth and gums.  Regular physical activity.  Eating a healthy diet.  Avoiding tobacco and drug use.  Limiting alcohol use.  Practicing safe sex.  Taking low doses of aspirin every day if recommended by your health care provider..  Taking vitamin and mineral supplements as recommended by your health  care provider. What happens during an annual well check? The services and screenings done by your health care provider during your annual well check will depend on your age, overall health, lifestyle risk factors, and family history of disease. Counseling  Your health care provider may ask you questions about your:  Alcohol use.  Tobacco use.  Drug use.  Emotional well-being.  Home and relationship well-being.  Sexual activity.  Eating habits.  History of falls.  Memory and ability to understand (cognition).  Work and work Statistician. Screening  You may have the following tests or measurements:  Height, weight, and BMI.  Blood pressure.  Lipid and cholesterol levels. These may be checked every 5 years, or more frequently if you are over 46 years old.  Skin check.  Lung cancer screening. You may have this screening every year starting at age 15 if you have a 30-pack-year history of smoking and currently smoke or have quit within the past 15 years.  Fecal occult blood test (FOBT) of the stool. You may have this test every year starting at age 27.  Flexible sigmoidoscopy or colonoscopy. You may have a sigmoidoscopy every 5 years or a colonoscopy every 10 years starting at age 25.  Prostate cancer screening. Recommendations will vary depending on your family history and other risks.  Hepatitis C blood test.  Hepatitis B blood test.  Sexually transmitted disease (STD) testing.  Diabetes screening. This is done by checking your blood sugar (glucose) after you have not eaten for a while (fasting). You may have this done every 1-3 years.  Abdominal aortic aneurysm (AAA) screening. You may need this if  you are a current or former smoker.  Osteoporosis. You may be screened starting at age 70 if you are at high risk. Talk with your health care provider about your test results, treatment options, and if necessary, the need for more tests. Vaccines  Your health care  provider may recommend certain vaccines, such as:  Influenza vaccine. This is recommended every year.  Tetanus, diphtheria, and acellular pertussis (Tdap, Td) vaccine. You may need a Td booster every 10 years.  Zoster vaccine. You may need this after age 21.  Pneumococcal 13-valent conjugate (PCV13) vaccine. One dose is recommended after age 49.  Pneumococcal polysaccharide (PPSV23) vaccine. One dose is recommended after age 90. Talk to your health care provider about which screenings and vaccines you need and how often you need them. This information is not intended to replace advice given to you by your health care provider. Make sure you discuss any questions you have with your health care provider. Document Released: 01/30/2015 Document Revised: 09/23/2015 Document Reviewed: 11/04/2014 Elsevier Interactive Patient Education  2017 [place] Prevention in the Home Falls can cause injuries. They can happen to people of all ages. There are many things you can do to make your home safe and to help prevent falls. What can I do on the outside of my home?  Regularly fix the edges of walkways and driveways and fix any cracks.  Remove anything that might make you trip as you walk through a door, such as a raised step or threshold.  Trim any bushes or trees on the path to your home.  Use bright outdoor lighting.  Clear any walking paths of anything that might make someone trip, such as rocks or tools.  Regularly check to see if handrails are loose or broken. Make sure that both sides of any steps have handrails.  Any raised decks and porches should have guardrails on the edges.  Have any leaves, snow, or ice cleared regularly.  Use sand or salt on walking paths during winter.  Clean up any spills in your garage right away. This includes oil or grease spills. What can I do in the bathroom?  Use night lights.  Install grab bars by the toilet and in the tub and shower. Do  not use towel bars as grab bars.  Use non-skid mats or decals in the tub or shower.  If you need to sit down in the shower, use a plastic, non-slip stool.  Keep the floor dry. Clean up any water that spills on the floor as soon as it happens.  Remove soap buildup in the tub or shower regularly.  Attach bath mats securely with double-sided non-slip rug tape.  Do not have throw rugs and other things on the floor that can make you trip. What can I do in the bedroom?  Use night lights.  Make sure that you have a light by your bed that is easy to reach.  Do not use any sheets or blankets that are too big for your bed. They should not hang down onto the floor.  Have a firm chair that has side arms. You can use this for support while you get dressed.  Do not have throw rugs and other things on the floor that can make you trip. What can I do in the kitchen?  Clean up any spills right away.  Avoid walking on wet floors.  Keep items that you use a lot in easy-to-reach places.  If you need to  reach something above you, use a strong step stool that has a grab bar.  Keep electrical cords out of the way.  Do not use floor polish or wax that makes floors slippery. If you must use wax, use non-skid floor wax.  Do not have throw rugs and other things on the floor that can make you trip. What can I do with my stairs?  Do not leave any items on the stairs.  Make sure that there are handrails on both sides of the stairs and use them. Fix handrails that are broken or loose. Make sure that handrails are as long as the stairways.  Check any carpeting to make sure that it is firmly attached to the stairs. Fix any carpet that is loose or worn.  Avoid having throw rugs at the top or bottom of the stairs. If you do have throw rugs, attach them to the floor with carpet tape.  Make sure that you have a light switch at the top of the stairs and the bottom of the stairs. If you do not have them,  ask someone to add them for you. What else can I do to help prevent falls?  Wear shoes that:  Do not have high heels.  Have rubber bottoms.  Are comfortable and fit you well.  Are closed at the toe. Do not wear sandals.  If you use a stepladder:  Make sure that it is fully opened. Do not climb a closed stepladder.  Make sure that both sides of the stepladder are locked into place.  Ask someone to hold it for you, if possible.  Clearly mark and make sure that you can see:  Any grab bars or handrails.  First and last steps.  Where the edge of each step is.  Use tools that help you move around (mobility aids) if they are needed. These include:  Canes.  Walkers.  Scooters.  Crutches.  Turn on the lights when you go into a dark area. Replace any light bulbs as soon as they burn out.  Set up your furniture so you have a clear path. Avoid moving your furniture around.  If any of your floors are uneven, fix them.  If there are any pets around you, be aware of where they are.  Review your medicines with your doctor. Some medicines can make you feel dizzy. This can increase your chance of falling. Ask your doctor what other things that you can do to help prevent falls. This information is not intended to replace advice given to you by your health care provider. Make sure you discuss any questions you have with your health care provider. Document Released: 10/30/2008 Document Revised: 06/11/2015 Document Reviewed: 02/07/2014 Elsevier Interactive Patient Education  2017 [employer].

## 2018-09-18 NOTE — Progress Notes (Signed)
Subjective:   Dale Wood is a 68 y.o. male who presents for Medicare Annual/Subsequent preventive examination.  Review of Systems:   Cardiac Risk Factors include: advanced age (>15men, >53 women);hypertension     Objective:    Vitals: BP 132/74 (BP Location: Left Arm, Patient Position: Sitting, Cuff Size: Large)   Temp 97.7 F (36.5 C) (Temporal)   Ht 6' (1.829 m)   Wt 253 lb 3.2 oz (114.9 kg)   BMI 34.34 kg/m   Body mass index is 34.34 kg/m.  Advanced Directives 09/18/2018 03/22/2018 09/08/2017 06/18/2015  Does Patient Have a Medical Advance Directive? Yes No No No  Type of Advance Directive Living will;Healthcare Power of Attorney - - -  Does patient want to make changes to medical advance directive? No - Patient declined - - -  Copy of [place] in Chart? No - copy requested - - -  Would patient like information on creating a medical advance directive? - No - Patient declined No - Patient declined No - patient declined information    Tobacco Social History   Tobacco Use  Smoking Status Former Smoker  . Types: Pipe, Cigars  Smokeless Tobacco Never Used       Clinical Intake:  Pre-visit preparation completed: Yes  Pain : No/denies pain  Diabetes: No  How often do you need to have someone help you when you read instructions, pamphlets, or other written materials from your doctor or pharmacy?: 1 - Never  Interpreter Needed?: No  Information entered by :: Denman George LPN  Past Medical History:  Diagnosis Date  . Abdominal pain, epigastric 10/27/2008   Qualifier: Diagnosis of  By: Ronnald Ramp MD, [name].   Marland Kitchen ANKLE INJURY, RIGHT 09/22/2009   Qualifier: Diagnosis of  By: Nathaneil Canary, CMA, [name]    . BASAL CELL CARCINOMA, FACE 10/27/2008   Qualifier: Diagnosis of  By: Ronnald Ramp MD, [name].   Marland Kitchen BASAL CELL CARCINOMA, FACE 10/27/2008   Qualifier: Diagnosis of By: Ronnald Ramp MD, [name].   . Cancer ([place])    Skin Cancer on Nose  . CELLULITIS, FOOT  09/22/2009   Qualifier: Diagnosis of  By: Ronnald Ramp MD, [name].   . Constipation 01/05/2017  . DYSMETABOLIC SYNDROME [id number]   Qualifier: Diagnosis of  By: Ronnald Ramp MD, [place] 10/27/2008   Qualifier: Diagnosis of  By: Ronnald Ramp MD, [name].   . ERECTILE DYSFUNCTION, ORGANIC 10/27/2008   Qualifier: Diagnosis of  By: Ronnald Ramp MD, [name].   . Headache 07/30/2015  . Heart murmur   . Hyperlipidemia   . HYPERLIPIDEMIA 10/27/2008   Qualifier: Diagnosis of  By: Ronnald Ramp MD, [name].   . Hypertension   . HYPOGONADISM 11/26/2008   Qualifier: Diagnosis of  By: Ronnald Ramp MD, [name].   . Kidney stones   . Low testosterone   . NEPHROLITHIASIS, HX OF 10/27/2008   Qualifier: Diagnosis of  By: Ronnald Ramp MD, [name].   . Nephrolithiasis, uric acid   . OSTEOARTHRITIS 10/27/2008   Qualifier: Diagnosis of  By: Ronnald Ramp MD, [name].   Marland Kitchen Positive TB test   . Postherpetic neuralgia 03/27/2017  . Rheumatic fever   . Rheumatic fever   . SKIN CANCER, HX OF 10/27/2008   Qualifier: Diagnosis of  By: Ronnald Ramp MD, [name].   . Tachycardia 11/09/2017  . Thyroid disease   . Unspecified disorder of kidney and ureter   . URI 02/05/2009   Qualifier: Diagnosis of  ByRonnald Ramp MD, [name].    Past Surgical History:  Procedure Laterality Date  . KNEE SURGERY Right    2005, 2008  . TONSILLECTOMY AND ADENOIDECTOMY     Family History  Problem Relation Age of Onset  . Cancer Mother   . Diabetes Mother   . Cancer Father   . Early death Father   . Diabetes Brother   . Cancer Brother        prostate  . Drug abuse Brother   . Birth defects Sister   . Diabetes Maternal Grandmother   . Thyroid disease Maternal Grandmother   . Stroke Maternal Grandmother   . Tremor Maternal Grandfather   . Obesity Son    Social History   Socioeconomic History  . Marital status: Married    Spouse name: Not on file  . Number of children: Not on file  . Years of education: Not on file  . Highest education level: Not on file   Occupational History  . Not on file  Social Needs  . Financial resource strain: Not on file  . Food insecurity    Worry: Not on file    Inability: Not on file  . Transportation needs    Medical: Not on file    Non-medical: Not on file  Tobacco Use  . Smoking status: Former Smoker    Types: Pipe, Landscape architect  . Smokeless tobacco: Never Used  Substance and Sexual Activity  . Alcohol use: Yes    Alcohol/week: 1.0 standard drinks    Types: 1 Cans of beer per week    Comment: occssionally  . Drug use: No  . Sexual activity: Yes  Lifestyle  . Physical activity    Days per week: Not on file    Minutes per session: Not on file  . Stress: Not on file  Relationships  . Social Herbalist on phone: Not on file    Gets together: Not on file    Attends religious service: Not on file    Active member of club or organization: Not on file    Attends meetings of clubs or organizations: Not on file    Relationship status: Not on file  Other Topics Concern  . Not on file  Social History Narrative  . Not on file    Outpatient Encounter Medications as of 09/18/2018  Medication Sig  . amLODipine (NORVASC) 10 MG tablet Take 1 tablet (10 mg total) by mouth daily.   No facility-administered encounter medications on file as of 09/18/2018.     Activities of Daily Living In your present state of health, do you have any difficulty performing the following activities: 09/18/2018  Hearing? N  Vision? N  Difficulty concentrating or making decisions? N  Walking or climbing stairs? N  Dressing or bathing? N  Doing errands, shopping? N  Preparing Food and eating ? N  Using the Toilet? N  In the past six months, have you accidently leaked urine? N  Do you have problems with loss of bowel control? N  Managing your Medications? N  Managing your Finances? N  Housekeeping or managing your Housekeeping? N  Some recent data might be hidden    Patient Care Team: Vivi Barrack, MD as PCP -  General (Family Medicine) Josue Hector, MD as Consulting Physician (Cardiology) Juluis Rainier as Consulting Physician (Optometry)   Assessment:   This is a routine wellness examination for Jamahl.  Exercise Activities and Dietary recommendations  Current Exercise Habits: Structured exercise class, Type of exercise: Other - see comments(swimming), Time (Minutes): 30, Frequency (Times/Week): 3, Weekly Exercise (Minutes/Week): 90  Goals    . Increase physical activity     Continue 3 days a week at the [place]. Goal of adding in the adjustable height treadmill to prepare for [place] trip       Fall Risk Fall Risk  09/18/2018 09/08/2017 11/22/2016 07/30/2015  Falls in the past year? 0 No No No  Number falls in past yr: 0 - - -  Injury with Fall? 0 - - -  Follow up Education provided - - -   Is the patient's home free of loose throw rugs in walkways, pet beds, electrical cords, etc?   yes      Grab bars in the bathroom? yes      Handrails on the stairs?   yes      Adequate lighting?   yes  Timed Get Up and Go Performed: completed and within normal timeframe   Depression Screen PHQ 2/9 Scores 09/18/2018 09/08/2017 11/22/2016  PHQ - 2 Score 0 0 0    Cognitive Function-no cognitive concerns at this time      6CIT Screen 09/18/2018 09/08/2017  What Year? 0 points 0 points  What month? 0 points 0 points  What time? 0 points 0 points  Count back from 20 0 points 0 points  Months in reverse 0 points 0 points    Immunization History  Administered Date(s) Administered  . Influenza, High Dose Seasonal PF 11/22/2016, 09/18/2018  . Influenza-Unspecified 09/17/2016  . Pneumococcal Conjugate-13 11/22/2016  . Td 01/17/2001  . Tdap 08/25/2010    Qualifies for Shingles Vaccine? Discussed and patient will check with pharmacy for coverage.  Patient education handout provided    Screening Tests Health Maintenance  Topic Date Due  . PNA vac Low Risk Adult (2 of 2 - PPSV23) 11/22/2017  .  INFLUENZA VACCINE  08/18/2018  . TETANUS/TDAP  08/24/2020  . COLONOSCOPY  10/11/2020  . Hepatitis C Screening  Completed   Cancer Screenings: Lung: Low Dose CT Chest recommended if Age 69-80 years, 30 pack-year currently smoking OR have quit w/in 15years. Patient does not qualify. Colorectal: last 2012; declines update         Plan:    I have personally reviewed and addressed the Medicare Annual Wellness questionnaire and have noted the following in the patient's chart:  A. Medical and social history B. Use of alcohol, tobacco or illicit drugs  C. Current medications and supplements D. Functional ability and status E.  Nutritional status F.  Physical activity G. Advance directives H. List of other physicians I.  Hospitalizations, surgeries, and ER visits in previous 12 months J.  [place] such as hearing and vision if needed, cognitive and depression L. Referrals, records requested, and appointments- none   In addition, I have reviewed and discussed with patient certain preventive protocols, quality metrics, and best practice recommendations. A written personalized care plan for preventive services as well as general preventive health recommendations were provided to patient.   Signed,  Denman George, LPN  Nurse Health Advisor   Nurse Notes: no additional

## 2018-09-18 NOTE — Progress Notes (Signed)
I have personally reviewed the Medicare Annual Wellness Visit and agree with the assessment and plan.  [name]. [name], MD 09/18/2018 4:20 PM

## 2018-09-28 NOTE — Addendum Note (Signed)
Addended by: Denman George B on: 09/28/2018 02:18 PM   Modules accepted: Orders

## 2018-09-28 NOTE — Progress Notes (Signed)
Flu shot administration updated to reflect correct manufacturer

## 2018-11-15 DIAGNOSIS — M25362 Other instability, left knee: Secondary | ICD-10-CM | POA: Diagnosis not present

## 2018-11-15 DIAGNOSIS — G8929 Other chronic pain: Secondary | ICD-10-CM | POA: Diagnosis not present

## 2018-11-15 DIAGNOSIS — M25562 Pain in left knee: Secondary | ICD-10-CM | POA: Diagnosis not present

## 2018-11-15 DIAGNOSIS — M25561 Pain in right knee: Secondary | ICD-10-CM | POA: Diagnosis not present

## 2018-11-15 DIAGNOSIS — M1711 Unilateral primary osteoarthritis, right knee: Secondary | ICD-10-CM | POA: Diagnosis not present

## 2018-11-21 ENCOUNTER — Other Ambulatory Visit: Payer: Self-pay | Admitting: Orthopedic Surgery

## 2018-11-21 DIAGNOSIS — R52 Pain, unspecified: Secondary | ICD-10-CM

## 2018-11-21 DIAGNOSIS — R609 Edema, unspecified: Secondary | ICD-10-CM

## 2019-01-14 ENCOUNTER — Other Ambulatory Visit: Payer: Self-pay

## 2019-01-14 ENCOUNTER — Other Ambulatory Visit: Payer: PPO

## 2019-01-15 ENCOUNTER — Ambulatory Visit (INDEPENDENT_AMBULATORY_CARE_PROVIDER_SITE_OTHER): Payer: PPO | Admitting: Family Medicine

## 2019-01-15 ENCOUNTER — Encounter: Payer: Self-pay | Admitting: Family Medicine

## 2019-01-15 VITALS — BP 142/80 | HR 86 | Temp 97.7°F | Ht 72.0 in | Wt 256.2 lb

## 2019-01-15 DIAGNOSIS — H9313 Tinnitus, bilateral: Secondary | ICD-10-CM

## 2019-01-15 DIAGNOSIS — I10 Essential (primary) hypertension: Secondary | ICD-10-CM

## 2019-01-15 DIAGNOSIS — Z23 Encounter for immunization: Secondary | ICD-10-CM | POA: Diagnosis not present

## 2019-01-15 DIAGNOSIS — N529 Male erectile dysfunction, unspecified: Secondary | ICD-10-CM | POA: Diagnosis not present

## 2019-01-15 MED ORDER — SILDENAFIL CITRATE 100 MG PO TABS: 50.0000 mg | ORAL_TABLET | Freq: Every day | ORAL | 5 refills | Status: DC | PRN

## 2019-01-15 MED ORDER — AMLODIPINE BESYLATE 10 MG PO TABS: 10.0000 mg | ORAL_TABLET | Freq: Every day | ORAL | 3 refills | Status: DC

## 2019-01-15 NOTE — Patient Instructions (Addendum)
It was very nice to see you today!  I will refill your medications and place a referral to ENT  Take care, Dr [name]  Please try these tips to maintain a healthy lifestyle:   Eat at least 3 REAL meals and 1-2 snacks per day.  Aim for no more than 5 hours between eating.  If you eat breakfast, please do so within one hour of getting up.     Each meal should contain half fruits/vegetables, one quarter protein, and one quarter carbs (no bigger than a computer mouse)   Cut down on sweet beverages. This includes juice, soda, and sweet tea.     Drink at least 1 glass of water with each meal and aim for at least 8 glasses per day   Exercise at least 150 minutes every week.

## 2019-01-15 NOTE — Assessment & Plan Note (Signed)
Sildenafil refilled

## 2019-01-15 NOTE — Progress Notes (Signed)
   Chief Complaint:  Dale Wood is a 68 y.o. male who presents today with a chief complaint of ear ringing.   Assessment/Plan:  Hypertension At goal per JNC 8.  Continue Norvasc 10 mg daily.  ERECTILE DYSFUNCTION, ORGANIC Sildenafil refilled.  Tinnitus No red flags.  Failed hearing screen today.  Will place referral to ENT per patient request.    Preventative Healthcare Pneumonia vaccine given today.     Subjective:  HPI:  Ear Ringing  Started about a year ago.  Worse in the last few months.  Bilateral ears.  No dizziness.  No pain.  No hearing loss.  No obvious precipitating events.  # Essential Hypertension -On Norvasc 10 mg daily.  Tolerating well. - ROS: No reported chest pain or shortness of breath.   # ED - Uses viagra 100mg  daily as needed and tolerating well  ROS: Per HPI  PMH: He reports that he has quit smoking. His smoking use included pipe and cigars. He has never used smokeless tobacco. He reports current alcohol use of about 1.0 standard drinks of alcohol per week. He reports that he does not use drugs.      Objective:  Physical Exam: BP (!) 142/80   Pulse 86   Temp 97.7 F (36.5 C)   Ht 6' (1.829 m)   Wt 256 lb 4 oz (116.2 kg)   SpO2 95%   BMI 34.75 kg/m   Wt Readings from Last 3 Encounters:  01/15/19 256 lb 4 oz (116.2 kg)  09/18/18 253 lb 3.2 oz (114.9 kg)  08/01/18 252 lb 9.6 oz (114.6 kg)  Gen: NAD, resting comfortably HEENT: TMs with bilateral effusion. CV: Regular rate and rhythm with no murmurs appreciated Pulm: Normal work of breathing, clear to auscultation bilaterally with no crackles, wheezes, or rhonchi GI: Normal bowel sounds present. Soft, Nontender, Nondistended.      Dale Wood. [name], MD 01/15/2019 9:25 AM

## 2019-01-15 NOTE — Assessment & Plan Note (Signed)
At goal per JNC 8.  Continue Norvasc 10 mg daily.

## 2019-01-30 ENCOUNTER — Ambulatory Visit
Admission: RE | Admit: 2019-01-30 | Discharge: 2019-01-30 | Disposition: A | Discharge status: Home / Self Care | Payer: PPO | Source: Ambulatory Visit | Attending: Orthopedic Surgery | Admitting: Orthopedic Surgery

## 2019-01-30 DIAGNOSIS — R52 Pain, unspecified: Secondary | ICD-10-CM

## 2019-01-30 DIAGNOSIS — R609 Edema, unspecified: Secondary | ICD-10-CM

## 2019-02-08 DIAGNOSIS — H9313 Tinnitus, bilateral: Secondary | ICD-10-CM | POA: Diagnosis not present

## 2019-02-08 DIAGNOSIS — H903 Sensorineural hearing loss, bilateral: Secondary | ICD-10-CM | POA: Diagnosis not present

## 2019-02-08 DIAGNOSIS — R94120 Abnormal auditory function study: Secondary | ICD-10-CM | POA: Diagnosis not present

## 2019-02-19 ENCOUNTER — Other Ambulatory Visit: Payer: Self-pay | Admitting: Orthopedic Surgery

## 2019-02-19 DIAGNOSIS — M25562 Pain in left knee: Secondary | ICD-10-CM

## 2019-03-18 ENCOUNTER — Other Ambulatory Visit: Payer: PPO

## 2019-03-19 ENCOUNTER — Other Ambulatory Visit: Payer: Self-pay

## 2019-03-19 ENCOUNTER — Ambulatory Visit
Admission: RE | Admit: 2019-03-19 | Discharge: 2019-03-19 | Disposition: A | Discharge status: Home / Self Care | Payer: PPO | Source: Ambulatory Visit | Attending: Orthopedic Surgery | Admitting: Orthopedic Surgery

## 2019-03-19 DIAGNOSIS — M25562 Pain in left knee: Secondary | ICD-10-CM

## 2019-06-26 ENCOUNTER — Ambulatory Visit (INDEPENDENT_AMBULATORY_CARE_PROVIDER_SITE_OTHER): Payer: PPO | Admitting: Family Medicine

## 2019-06-26 ENCOUNTER — Other Ambulatory Visit: Payer: Self-pay

## 2019-06-26 ENCOUNTER — Encounter: Payer: Self-pay | Admitting: Family Medicine

## 2019-06-26 VITALS — BP 170/92 | HR 80 | Temp 97.6°F | Resp 15 | Wt 258.8 lb

## 2019-06-26 DIAGNOSIS — I10 Essential (primary) hypertension: Secondary | ICD-10-CM

## 2019-06-26 DIAGNOSIS — R600 Localized edema: Secondary | ICD-10-CM

## 2019-06-26 DIAGNOSIS — M79604 Pain in right leg: Secondary | ICD-10-CM

## 2019-06-26 DIAGNOSIS — M545 Low back pain: Secondary | ICD-10-CM

## 2019-06-26 MED ORDER — PREDNISONE 10 MG PO TABS: ORAL_TABLET | ORAL | 0 refills | Status: DC

## 2019-06-26 MED ORDER — CYCLOBENZAPRINE HCL 10 MG PO TABS: 10.0000 mg | ORAL_TABLET | Freq: Every evening | ORAL | 0 refills | Status: DC | PRN

## 2019-06-26 MED ORDER — TRAMADOL HCL 50 MG PO TABS: 50.0000 mg | ORAL_TABLET | Freq: Three times a day (TID) | ORAL | 0 refills | Status: AC | PRN

## 2019-06-26 NOTE — Patient Instructions (Addendum)
Please return in 2 weeks to see Dr. [name] for recheck on your back pain and your blood pressure. Please restart your blood pressure medicines today.   Please go to our [facility] office to get your xrays done. You can walk in M-F between 8:30am- noon or 1pm - 5pm. Tell them you are there for xrays ordered by me. They will send me the results, then I will let you know the results with instructions.   Address: [address]  The Xray department is located in the basement.    If you have any questions or concerns, please don't hesitate to send me a message via MyChart or call the office at [phone]. Thank you for visiting with [place] today! It's our pleasure caring for you.   Back Exercises The following exercises strengthen the muscles that help to support the trunk and back. They also help to keep the lower back flexible. Doing these exercises can help to prevent back pain or lessen existing pain.  If you have back pain or discomfort, try doing these exercises 2-3 times each day or as told by your health care provider.  As your pain improves, do them once each day, but increase the number of times that you repeat the steps for each exercise (do more repetitions).  To prevent the recurrence of back pain, continue to do these exercises once each day or as told by your health care provider. Do exercises exactly as told by your health care provider and adjust them as directed. It is normal to feel mild stretching, pulling, tightness, or discomfort as you do these exercises, but you should stop right away if you feel sudden pain or your pain gets worse. Exercises Single knee to chest Repeat these steps 3-5 times for each leg: 1. Lie on your back on a firm bed or the floor with your legs extended. 2. Bring one knee to your chest. Your other leg should stay extended and in contact with the floor. 3. Hold your knee in place by grabbing your knee or thigh with both hands and  hold. 4. Pull on your knee until you feel a gentle stretch in your lower back or buttocks. 5. Hold the stretch for 10-30 seconds. 6. Slowly release and straighten your leg. Pelvic tilt Repeat these steps 5-10 times: 1. Lie on your back on a firm bed or the floor with your legs extended. 2. Bend your knees so they are pointing toward the ceiling and your feet are flat on the floor. 3. Tighten your lower abdominal muscles to press your lower back against the floor. This motion will tilt your pelvis so your tailbone points up toward the ceiling instead of pointing to your feet or the floor. 4. With gentle tension and even breathing, hold this position for 5-10 seconds. Cat-cow Repeat these steps until your lower back becomes more flexible: 1. Get into a hands-and-knees position on a firm surface. Keep your hands under your shoulders, and keep your knees under your hips. You may place padding under your knees for comfort. 2. Let your head hang down toward your chest. Contract your abdominal muscles and point your tailbone toward the floor so your lower back becomes rounded like the back of a cat. 3. Hold this position for 5 seconds. 4. Slowly lift your head, let your abdominal muscles relax and point your tailbone up toward the ceiling so your back forms a sagging arch like the back of a cow. 5.  Hold this position for 5 seconds.  Press-ups Repeat these steps 5-10 times: 1. Lie on your abdomen (face-down) on the floor. 2. Place your palms near your head, about shoulder-width apart. 3. Keeping your back as relaxed as possible and keeping your hips on the floor, slowly straighten your arms to raise the top half of your body and lift your shoulders. Do not use your back muscles to raise your upper torso. You may adjust the placement of your hands to make yourself more comfortable. 4. Hold this position for 5 seconds while you keep your back relaxed. 5. Slowly return to lying flat on the  floor.  Bridges Repeat these steps 10 times: 1. Lie on your back on a firm surface. 2. Bend your knees so they are pointing toward the ceiling and your feet are flat on the floor. Your arms should be flat at your sides, next to your body. 3. Tighten your buttocks muscles and lift your buttocks off the floor until your waist is at almost the same height as your knees. You should feel the muscles working in your buttocks and the back of your thighs. If you do not feel these muscles, slide your feet 1-2 inches farther away from your buttocks. 4. Hold this position for 3-5 seconds. 5. Slowly lower your hips to the starting position, and allow your buttocks muscles to relax completely. If this exercise is too easy, try doing it with your arms crossed over your chest. Abdominal crunches Repeat these steps 5-10 times: 1. Lie on your back on a firm bed or the floor with your legs extended. 2. Bend your knees so they are pointing toward the ceiling and your feet are flat on the floor. 3. Cross your arms over your chest. 4. Tip your chin slightly toward your chest without bending your neck. 5. Tighten your abdominal muscles and slowly raise your trunk (torso) high enough to lift your shoulder blades a tiny bit off the floor. Avoid raising your torso higher than that because it can put too much stress on your low back and does not help to strengthen your abdominal muscles. 6. Slowly return to your starting position. Back lifts Repeat these steps 5-10 times: 1. Lie on your abdomen (face-down) with your arms at your sides, and rest your forehead on the floor. 2. Tighten the muscles in your legs and your buttocks. 3. Slowly lift your chest off the floor while you keep your hips pressed to the floor. Keep the back of your head in line with the curve in your back. Your eyes should be looking at the floor. 4. Hold this position for 3-5 seconds. 5. Slowly return to your starting position. Contact a health  care provider if:  Your back pain or discomfort gets much worse when you do an exercise.  Your worsening back pain or discomfort does not lessen within 2 hours after you exercise. If you have any of these problems, stop doing these exercises right away. Do not do them again unless your health care provider says that you can. Get help right away if:  You develop sudden, severe back pain. If this happens, stop doing the exercises right away. Do not do them again unless your health care provider says that you can. This information is not intended to replace advice given to you by your health care provider. Make sure you discuss any questions you have with your health care provider. Document Revised: 05/10/2018 Document Reviewed: 10/05/2017 Elsevier Patient Education  [phone]  [employer].

## 2019-06-26 NOTE — Progress Notes (Signed)
Subjective  CC:  Chief Complaint  Patient presents with  . Back Pain    hx kidney pain on right side, recently lifted 65+ lb dog, picking up 40-50 lb boxes at work    Same day acute visit; PCP not available. New pt to me. Chart reviewed.   HPI: Dale Wood is a 69 y.o. male who presents to the office today to address the problems listed above in the chief complaint.  69 yo with several months of right low back pain but worsened over the last two weeks w/o specific injury: however he lifts heavy things often. Having pain with getting up from standing; takes a minute to "get going". Hard to stand up straight over the last few days. Some radiation of pain to right ant thigh. No groin pain. No gross hematuria. Has h/o kidney stones but this pain feels different. Some mm spasm present. No leg weakness. otc meds w/o relief at this point. Has known DJD knees and back.   HTN: stopped bp meds awhile back. No cp or sob.   Assessment  1. Low back pain radiating to right leg   2. Essential hypertension   3. Bilateral lower extremity edema      Plan   Low back pain with radiation:  MSK +/- nerve root impingement. Trial of pred (discussed risks/benefits), mm relaxer and pain meds and xrays. Close f/u. Recheck 2 weeks. Further eval if not improving. Discussed red flag sxs. None present now.  Uncontrolled HTN and leg edema. rec restarting bp meds and recheck 2 weeks. Discussed importance of treating HTN.    Follow up: 2 weeks for recheck.  08/29/2019  Orders Placed This Encounter  Procedures  . DG Lumbar Spine Complete   Meds ordered this encounter  Medications  . traMADol (ULTRAM) 50 MG tablet    Sig: Take 1 tablet (50 mg total) by mouth every 8 (eight) hours as needed for up to 5 days.    Dispense:  15 tablet    Refill:  0  . predniSONE (DELTASONE) 10 MG tablet    Sig: Take 4 tabs qd x 2 days, 3 qd x 2 days, 2 qd x 2d, 1qd x 3 days    Dispense:  21 tablet    Refill:  0  .  cyclobenzaprine (FLEXERIL) 10 MG tablet    Sig: Take 1 tablet (10 mg total) by mouth at bedtime as needed for muscle spasms.    Dispense:  30 tablet    Refill:  0      I reviewed the patients updated PMH, FH, and SocHx.    Patient Active Problem List   Diagnosis Date Noted  . Constipation 01/05/2017  . Hyperlipidemia 01/05/2017  . Hypertension 01/05/2017  . ANKLE INJURY, RIGHT 09/22/2009  . HYPERLIPIDEMIA 10/27/2008  . ERECTILE DYSFUNCTION, ORGANIC 10/27/2008  . OSTEOARTHRITIS 10/27/2008  . ABDOMINAL PAIN, EPIGASTRIC 10/27/2008  . SKIN CANCER, HX OF 10/27/2008  . NEPHROLITHIASIS, HX OF 10/27/2008  . BASAL CELL CARCINOMA, FACE 10/27/2008   Current Meds  Medication Sig  . amLODipine (NORVASC) 10 MG tablet Take 1 tablet (10 mg total) by mouth daily.  . sildenafil (VIAGRA) 100 MG tablet Take 0.5-1 tablets (50-100 mg total) by mouth daily as needed for erectile dysfunction.    Allergies: Patient is allergic to androgel [testosterone] and penicillins. Family History: Patient family history includes Birth defects in his sister; Cancer in his brother, father, and mother; Diabetes in his brother, maternal grandmother, and  mother; Drug abuse in his brother; Early death in his father; Obesity in his son; Stroke in his maternal grandmother; Thyroid disease in his maternal grandmother; Tremor in his maternal grandfather. Social History:  Patient  reports that he has quit smoking. His smoking use included pipe and cigars. He has never used smokeless tobacco. He reports current alcohol use of about 1.0 standard drink of alcohol per week. He reports that he does not use drugs.  Review of Systems: Constitutional: Negative for fever malaise or anorexia Cardiovascular: negative for chest pain Respiratory: negative for SOB or persistent cough Gastrointestinal: negative for abdominal pain  Objective  Vitals: BP (!) 170/92   Pulse 80   Temp 97.6 F (36.4 C) (Temporal)   Resp 15   Wt 258 lb  12.8 oz (117.4 kg)   SpO2 96%   BMI 35.10 kg/m  General: no acute distress but some pain with getting to table, A&Ox3 Cardiovascular:  RRR without murmur or gallop.  Respiratory:  Good breath sounds bilaterally, CTAB with normal respiratory effort Skin:  Warm, no rashes Ext: + 1 pitting edema bilaterally Back: + right paravertebral mm spasm and ttp, decreased extension due to pain. Neg SLR bilaterally with good strength. +2 knee reflexes    Commons side effects, risks, benefits, and alternatives for medications and treatment plan prescribed today were discussed, and the patient expressed understanding of the given instructions. Patient is instructed to call or message via MyChart if he/she has any questions or concerns regarding our treatment plan. No barriers to understanding were identified. We discussed Red Flag symptoms and signs in detail. Patient expressed understanding regarding what to do in case of urgent or emergency type symptoms.   Medication list was reconciled, printed and provided to the patient in AVS. Patient instructions and summary information was reviewed with the patient as documented in the AVS. This note was prepared with assistance of Dragon voice recognition software. Occasional wrong-word or sound-a-like substitutions may have occurred due to the inherent limitations of voice recognition software  This visit occurred during the SARS-CoV-2 public health emergency.  Safety protocols were in place, including screening questions prior to the visit, additional usage of staff PPE, and extensive cleaning of exam room while observing appropriate contact time as indicated for disinfecting solutions.

## 2019-06-27 ENCOUNTER — Encounter: Payer: Self-pay | Admitting: Family Medicine

## 2019-07-05 ENCOUNTER — Other Ambulatory Visit: Payer: Self-pay

## 2019-07-05 ENCOUNTER — Ambulatory Visit (INDEPENDENT_AMBULATORY_CARE_PROVIDER_SITE_OTHER)
Admission: RE | Admit: 2019-07-05 | Discharge: 2019-07-05 | Disposition: A | Discharge status: Home / Self Care | Payer: PPO | Source: Ambulatory Visit | Attending: Family Medicine | Admitting: Family Medicine

## 2019-07-05 DIAGNOSIS — M545 Low back pain: Secondary | ICD-10-CM

## 2019-07-05 DIAGNOSIS — M79604 Pain in right leg: Secondary | ICD-10-CM

## 2019-07-09 ENCOUNTER — Other Ambulatory Visit: Payer: Self-pay

## 2019-07-09 ENCOUNTER — Ambulatory Visit (INDEPENDENT_AMBULATORY_CARE_PROVIDER_SITE_OTHER): Payer: PPO | Admitting: Family Medicine

## 2019-07-09 ENCOUNTER — Encounter: Payer: Self-pay | Admitting: Family Medicine

## 2019-07-09 VITALS — BP 111/69 | HR 81 | Temp 98.5°F | Ht 72.0 in | Wt 257.0 lb

## 2019-07-09 DIAGNOSIS — I10 Essential (primary) hypertension: Secondary | ICD-10-CM

## 2019-07-09 DIAGNOSIS — M545 Low back pain, unspecified: Secondary | ICD-10-CM

## 2019-07-09 DIAGNOSIS — M5440 Lumbago with sciatica, unspecified side: Secondary | ICD-10-CM

## 2019-07-09 MED ORDER — DICLOFENAC SODIUM 75 MG PO TBEC: 75.0000 mg | DELAYED_RELEASE_TABLET | Freq: Two times a day (BID) | ORAL | 0 refills | Status: DC

## 2019-07-09 NOTE — Assessment & Plan Note (Signed)
No red flags.  Will start diclofenac.  Given persistence of symptoms for almost 2 months at this point will place referral to sports medicine for further evaluation.

## 2019-07-09 NOTE — Patient Instructions (Signed)
It was very nice to see you today!  You have arthritis in your back.  I think this is causing most of your symptoms.  Please start the diclofenac.  Also place referral for you to see the sports medicine doctor to discuss possible MRI.  Please keep an eye on your blood pressure and let me know how it is looking over the next couple weeks.  Take care, Dr [name]  Please try these tips to maintain a healthy lifestyle:   Eat at least 3 REAL meals and 1-2 snacks per day.  Aim for no more than 5 hours between eating.  If you eat breakfast, please do so within one hour of getting up.    Each meal should contain half fruits/vegetables, one quarter protein, and one quarter carbs (no bigger than a computer mouse)   Cut down on sweet beverages. This includes juice, soda, and sweet tea.     Drink at least 1 glass of water with each meal and aim for at least 8 glasses per day   Exercise at least 150 minutes every week.

## 2019-07-09 NOTE — Progress Notes (Signed)
   Dale Wood is a 69 y.o. male who presents today for an office visit.  Assessment/Plan:  Chronic Problems Addressed Today: Low back pain with sciatica No red flags.  Will start diclofenac.  Given persistence of symptoms for almost 2 months at this point will place referral to sports medicine for further evaluation.  Hypertension Initially hypertensive to 180/90.  On recheck was 111/69.  We will continue amlodipine 10 mg daily.  He will check in with me if becomes persistently elevated at home.    Subjective:  HPI:  Patient here for follow-up.  Was seen 2 weeks ago in this office by another physician for back pain.  At that point was having symptoms for about a month. Was started on prednisone, tramadol, and Flexeril.Symptoms have improved modestly though are still present.  Symptoms can worsen with certain motions such as bending over or twisting.  Still has radiation into his right knee.  Was also found to have elevated blood pressure reading after self discontinuing amlodipine.  He was restarted on 10mg  daily.        Objective:  Physical Exam: BP 111/69   Pulse 81   Temp 98.5 F (36.9 C) (Temporal)   Ht 6' (1.829 m)   Wt 257 lb (116.6 kg)   SpO2 96%   BMI 34.86 kg/m   Gen: No acute distress, resting comfortably CV: Regular rate and rhythm with no murmurs appreciated Pulm: Normal work of breathing, clear to auscultation bilaterally with no crackles, wheezes, or rhonchi Neuro: Grossly normal, moves all extremities Psych: Normal affect and thought content      [name] M. [name], MD 07/09/2019 1:49 PM

## 2019-07-09 NOTE — Assessment & Plan Note (Signed)
Initially hypertensive to 180/90.  On recheck was 111/69.  We will continue amlodipine 10 mg daily.  He will check in with me if becomes persistently elevated at home.

## 2019-07-15 ENCOUNTER — Other Ambulatory Visit: Payer: Self-pay

## 2019-07-15 ENCOUNTER — Encounter: Payer: Self-pay | Admitting: Family Medicine

## 2019-07-15 ENCOUNTER — Ambulatory Visit: Payer: PPO | Admitting: Family Medicine

## 2019-07-15 VITALS — BP 158/96 | HR 89 | Ht 72.0 in | Wt 260.0 lb

## 2019-07-15 DIAGNOSIS — M79651 Pain in right thigh: Secondary | ICD-10-CM

## 2019-07-15 DIAGNOSIS — S39012A Strain of muscle, fascia and tendon of lower back, initial encounter: Secondary | ICD-10-CM

## 2019-07-15 MED ORDER — GABAPENTIN 300 MG PO CAPS: 300.0000 mg | ORAL_CAPSULE | Freq: Three times a day (TID) | ORAL | 3 refills | Status: DC | PRN

## 2019-07-15 NOTE — Progress Notes (Signed)
Subjective:    I'm seeing this patient as a consultation for:  Dr. [name]. Note will be routed back to referring provider/PCP.  CC: Low back pain w/ radiating pain into R LE  I, Molly Weber, LAT, ATC, am serving as scribe for Dr. Lynne Leader.  HPI: Pt is a 69 y/o male presenting w/ c/o low back pain x approximately one month w/ radiating pain into his R ant  LE to his R knee.  He has been seen by primary care 2x in the last 2 weeks and has been prescribed Tramadol, Flexeril, Diclofenac and a prednisone taper.  Radiating pain: yes into the R ant LE to the knee LE numbness/tingling: No LE weakness: No Aggravating factors: forward flexion; getting out of bed in the morning; transitioning from sit-to-stand; lumbar extension Treatments tried: Flexeril; Diclofenac bid; finished prednisone and Tramadol; ice  Diagnostic testing: L-spine XR- 07/05/19  Past medical history, Surgical history, Family history, Social history, Allergies, and medications have been entered into the medical record, reviewed.   Review of Systems: No new headache, visual changes, nausea, vomiting, diarrhea, constipation, dizziness, abdominal pain, skin rash, fevers, chills, night sweats, weight loss, swollen lymph nodes, body aches, joint swelling, muscle aches, chest pain, shortness of breath, mood changes, visual or auditory hallucinations.   Objective:    Vitals:   07/15/19 1445  BP: (!) 158/96  Pulse: 89  SpO2: 94%   General: Well Developed, well nourished, and in no acute distress.  Neuro/Psych: Alert and oriented x3, extra-ocular muscles intact, able to move all 4 extremities, sensation grossly intact. Skin: Warm and dry, no rashes noted.  Respiratory: Not using accessory muscles, speaking in full sentences, trachea midline.  Cardiovascular: Pulses palpable, no extremity edema. Abdomen: Does not appear distended. MSK: L-spine: Patient sits in a forward flexed left flexed position. Nontender midline.   Tender palpation right paraspinal musculature. Range of motion limited extension rotation and lateral flexion.  Normal flexion however some pain. Lower extremity strength is intact throughout bilateral lower extremities. Right hip normal-appearing nontender normal motion without pain. Antalgic gait.  Lab and Radiology Results DG Lumbar Spine Complete  Result Date: 07/06/2019 CLINICAL DATA:  Acute on chronic left back pain. EXAM: LUMBAR SPINE - COMPLETE 4+ VIEW COMPARISON:  CT scan March 22, 2018 FINDINGS: Mild anterior wedging of T11 is unchanged. No acute fractures or traumatic malalignment. Multilevel degenerative disc disease with anterior osteophytes. Lower lumbar facet degenerative changes. No other abnormalities. IMPRESSION: Multilevel degenerative disc disease and lower lumbar facet degenerative changes. Electronically Signed   By: Dorise Bullion III M.D   On: 07/06/2019 19:13   I, Lynne Leader, personally (independently) visualized and performed the interpretation of the images attached in this note.   Impression and Recommendations:    Assessment and Plan: 69 y.o. male with right lumbosacral pain due to lumbosacral spasm and dysfunction.  Patient should benefit significantly from physical therapy.  Already on reasonable medication for spasm and pain including diclofenac and Flexeril.  X-ray did show significant degenerative changes.  Recheck back in 3 to 4 weeks if not better next step would be MRI.  Patient mentions he will need an open MRI.  Right thigh pain: Likely L2-3 radiculopathy.  Doubtful for hip etiology.  Trial of gabapentin.  Fortunately no significant lower extremity weakness.  Again physical therapy may be helpful.Marland Kitchen  PDMP not reviewed this encounter. Orders Placed This Encounter  Procedures  . Ambulatory referral to Physical Therapy    Referral Priority:  Routine    Referral Type:   Physical Medicine    Referral Reason:   Specialty Services Required    Requested  Specialty:   Physical Therapy   Meds ordered this encounter  Medications  . gabapentin (NEURONTIN) 300 MG capsule    Sig: Take 1 capsule (300 mg total) by mouth 3 (three) times daily as needed (nerve pain).    Dispense:  90 capsule    Refill:  3    Discussed warning signs or symptoms. Please see discharge instructions. Patient expresses understanding.   The above documentation has been reviewed and is accurate and complete Lynne Leader, M.D.

## 2019-07-15 NOTE — Patient Instructions (Addendum)
Thank you for coming in today. Continue medicines as needed.  Use heating pad and TENS unit.  Attend PT.  Recheck in 3-4 weeks or sooner if needed.  Next step is MRI.  Will use an open MRI if needed.  Let me know if you are having problems getting an appointment.  Try gabapentin for nerve pain down the leg. Take it up to 3x daily for pain as needed.  It may also make you sleepy.    TENS UNIT: This is helpful for muscle pain and spasm.   Search and Purchase a TENS 7000 2nd edition at  www.tenspros.com or www.[email or web address].com It should be less than $30.     TENS unit instructions: Do not shower or bathe with the unit on Turn the unit off before removing electrodes or batteries If the electrodes lose stickiness add a drop of water to the electrodes after they are disconnected from the unit and place on plastic sheet. If you continued to have difficulty, call the TENS unit company to purchase more electrodes. Do not apply lotion on the skin area prior to use. Make sure the skin is clean and dry as this will help prolong the life of the electrodes. After use, always check skin for unusual red areas, rash or other skin difficulties. If there are any skin problems, does not apply electrodes to the same area. Never remove the electrodes from the unit by pulling the wires. Do not use the TENS unit or electrodes other than as directed. Do not change electrode placement without consultating your therapist or physician. Keep 2 fingers with between each electrode. Wear time ratio is 2:1, on to off times.    For example on for 30 minutes off for 15 minutes and then on for 30 minutes off for 15 minutes     Lumbosacral Strain Lumbosacral strain is an injury that causes pain in the lower back (lumbosacral spine). This injury usually happens from overstretching the muscles or ligaments along your spine. Ligaments are cord-like tissues that connect bones to other bones. A strain can affect one or more  muscles or ligaments. What are the causes? This condition may be caused by: A hard, direct hit to the back. Overstretching the lower back muscles. This may result from: A fall. Lifting something heavy. Repetitive movements such as bending or crouching. What increases the risk? The following factors may make you more likely to develop this condition: Participating in sports or activities that involve: A sudden twist of the back. Pushing or pulling motions. Being overweight or obese. Having poor strength and flexibility, especially tight hamstrings or weak muscles in the back or abdomen. Having too much of a curve in the lower back. Having a pelvis that is tilted forward. What are the signs or symptoms? The main symptom of this condition is pain in the lower back, at the site of the strain. Pain may also be felt down one or both legs. How is this diagnosed? This condition is diagnosed based on your symptoms, your medical history, and a physical exam. During the physical exam, your health care provider may push on certain areas of your back to find the source of your pain. You may be asked to bend forward, backward, and side to side to check your pain and range of motion. You may also have imaging tests, such as X-rays and an MRI. How is this treated? This condition may be treated by: Applying heat and cold on the affected area. Taking medicines to  help relieve pain and relax your muscles. Taking NSAIDs, such as ibuprofen, to help reduce swelling and discomfort. Doing stretching and strengthening exercises for your lower back. Symptoms usually improve within several weeks of treatment. However, recovery time varies. When your symptoms improve, gradually return to your normal routine as soon as possible to reduce pain, avoid stiffness, and keep muscle strength. Follow these instructions at home: Medicines Take over-the-counter and prescription medicines only as told by your health care  provider. Ask your health care provider if the medicine prescribed to you: Requires you to avoid driving or using heavy machinery. Can cause constipation. You may need to take these actions to prevent or treat constipation: Drink enough fluid to keep your urine pale yellow. Take over-the-counter or prescription medicines. Eat foods that are high in fiber, such as beans, whole grains, and fresh fruits and vegetables. Limit foods that are high in fat and processed sugars, such as fried or sweet foods. Managing pain, stiffness, and swelling     If directed, put ice on the injured area. To do this: Put ice in a plastic bag. Place a towel between your skin and the bag. Leave the ice on for 20 minutes, 2-3 times a day. If directed, apply heat on the affected area as often as told by your health care provider. Use the heat source that your health care provider recommends, such as a moist heat pack or a heating pad. Place a towel between your skin and the heat source. Leave the heat on for 20-30 minutes. Remove the heat if your skin turns bright red. This is especially important if you are unable to feel pain, heat, or cold. You may have a greater risk of getting burned. Activity Rest as told by your health care provider. Do not stay in bed. Staying in bed for more than 1-2 days can delay your recovery. Return to your normal activities as told by your health care provider. Ask your health care provider what activities are safe for you. Avoid activities that take a lot of energy for as long as told by your health care provider. Do exercises as told by your health care provider. This includes stretching and strengthening exercises. General instructions Sit up and stand up straight. Avoid leaning forward when you sit, or hunching over when you stand. Do not use any products that contain nicotine or tobacco, such as cigarettes, e-cigarettes, and chewing tobacco. If you need help quitting, ask your  health care provider. Keep all follow-up visits as told by your health care provider. This is important. How is this prevented?  Use correct form when playing sports and lifting heavy objects. Use good posture when sitting and standing. Maintain a healthy weight. Sleep on a mattress with medium firmness to support your back. Do at least 150 minutes of moderate-intensity exercise each week, such as brisk walking or water aerobics. Try a form of exercise that takes stress off your back, such as swimming or stationary cycling. Maintain physical fitness, including: Strength. Flexibility. Contact a health care provider if: Your back pain does not improve after several weeks of treatment. Your symptoms get worse. Get help right away if: Your back pain is severe. You cannot stand or walk. You have difficulty controlling when you urinate or when you have a bowel movement. You feel nauseous or you vomit. Your feet or legs get very cold, turn pale, or look blue. You have numbness, tingling, weakness, or problems using your arms or legs. You develop any  of the following: Shortness of breath. Dizziness. Pain in your legs. Weakness in your buttocks or legs. Summary Lumbosacral strain is an injury that causes pain in the lower back (lumbosacral spine). This injury usually happens from overstretching the muscles or ligaments along your spine. This condition may be caused by a direct hit to the lower back or by overstretching the lower back muscles. Symptoms usually improve within several weeks of treatment. This information is not intended to replace advice given to you by your health care provider. Make sure you discuss any questions you have with your health care provider. Document Revised: 05/29/2018 Document Reviewed: 05/29/2018 Elsevier Patient Education  [place].

## 2019-07-16 ENCOUNTER — Ambulatory Visit: Payer: PPO | Admitting: Family Medicine

## 2019-07-21 ENCOUNTER — Other Ambulatory Visit: Payer: Self-pay | Admitting: Family Medicine

## 2019-07-22 ENCOUNTER — Encounter: Payer: Self-pay | Admitting: Family Medicine

## 2019-07-24 ENCOUNTER — Other Ambulatory Visit: Payer: Self-pay | Admitting: Family Medicine

## 2019-07-24 ENCOUNTER — Other Ambulatory Visit: Payer: Self-pay

## 2019-07-24 ENCOUNTER — Encounter: Payer: Self-pay | Admitting: Family Medicine

## 2019-07-24 ENCOUNTER — Ambulatory Visit (INDEPENDENT_AMBULATORY_CARE_PROVIDER_SITE_OTHER): Payer: PPO | Admitting: Physical Therapy

## 2019-07-24 DIAGNOSIS — M5416 Radiculopathy, lumbar region: Secondary | ICD-10-CM

## 2019-07-24 NOTE — Telephone Encounter (Signed)
Noted  

## 2019-07-24 NOTE — Patient Instructions (Signed)
Access Code: [email or web address] URL: [email or web address] Date: 07/24/2019 Prepared by: Lyndee Hensen  Exercises Supine Posterior Pelvic Tilt - 2 x daily - 2 sets - 10 reps Supine Single Knee to Chest Stretch - 2 x daily - 3 reps - 30 hold Supine Figure 4 Piriformis Stretch - 2 x daily - 3 reps - 30 hold Seated Hamstring Stretch - 2 x daily - 3 reps - 30 hold

## 2019-07-26 ENCOUNTER — Encounter: Payer: Self-pay | Admitting: Physical Therapy

## 2019-07-26 NOTE — Therapy (Signed)
[facility] [address] [place], [place], [address] Phone: [phone]   Fax:  [phone]  Physical Therapy Evaluation  Patient Details  Name: Dale Wood MRN: [id number] Date of Birth: 07-Mar-1950 Referring Provider (PT): Lynne Leader    Encounter Date: 07/24/2019   PT End of Session - 07/26/19 1459    Visit Number 1    Number of Visits 12    Date for PT Re-Evaluation 09/04/19    Authorization Type HTA    PT Start Time 5277    PT Stop Time 1555    PT Time Calculation (min) 40 min    Activity Tolerance Patient tolerated treatment well    Behavior During Therapy [facility] for tasks assessed/performed           Past Medical History:  Diagnosis Date  . Abdominal pain, epigastric 10/27/2008   Qualifier: Diagnosis of  By: Ronnald Ramp MD, [name].   Marland Kitchen ANKLE INJURY, RIGHT 09/22/2009   Qualifier: Diagnosis of  By: Nathaneil Canary, CMA, [name]    . BASAL CELL CARCINOMA, FACE 10/27/2008   Qualifier: Diagnosis of  By: Ronnald Ramp MD, [name].   Marland Kitchen BASAL CELL CARCINOMA, FACE 10/27/2008   Qualifier: Diagnosis of By: Ronnald Ramp MD, [name].   . Cancer ([place])    Skin Cancer on Nose  . CELLULITIS, FOOT 09/22/2009   Qualifier: Diagnosis of  By: Ronnald Ramp MD, [name].   . Constipation 01/05/2017  . DYSMETABOLIC SYNDROME 09/10/2351   Qualifier: Diagnosis of  By: Ronnald Ramp MD, [place] 10/27/2008   Qualifier: Diagnosis of  By: Ronnald Ramp MD, [name].   . ERECTILE DYSFUNCTION, ORGANIC 10/27/2008   Qualifier: Diagnosis of  By: Ronnald Ramp MD, [name].   . Headache 07/30/2015  . Heart murmur   . Hyperlipidemia   . HYPERLIPIDEMIA 10/27/2008   Qualifier: Diagnosis of  By: Ronnald Ramp MD, [name].   . Hypertension   . HYPOGONADISM 11/26/2008   Qualifier: Diagnosis of  By: Ronnald Ramp MD, [name].   . Kidney stones   . Low testosterone   . NEPHROLITHIASIS, HX OF 10/27/2008   Qualifier: Diagnosis of  By: Ronnald Ramp MD, [name].   . Nephrolithiasis, uric acid   . OSTEOARTHRITIS  10/27/2008   Qualifier: Diagnosis of  By: Ronnald Ramp MD, [name].   Marland Kitchen Positive TB test   . Postherpetic neuralgia 03/27/2017  . Rheumatic fever   . Rheumatic fever   . SKIN CANCER, HX OF 10/27/2008   Qualifier: Diagnosis of  By: Ronnald Ramp MD, [name].   . Tachycardia 11/09/2017  . Thyroid disease   . Unspecified disorder of kidney and ureter   . URI 02/05/2009   Qualifier: Diagnosis of  By: Ronnald Ramp MD, [name].     Past Surgical History:  Procedure Laterality Date  . KNEE SURGERY Right    2005, 2008  . TONSILLECTOMY AND ADENOIDECTOMY      There were no vitals filed for this visit.    Subjective Assessment - 07/26/19 1455    Subjective 3 wks - 1 mo ago, bent over to put shoe on, had increased spasm and pain that has not improved. PReviously has not had pain in back in quite some time. Has pain in R thigh/anterior and In R low back. Works at [employer], unloads trucks, does a lot of lifitng, bending. Has been able to work full duty/hours. Medication is the only thing that has helped in last couple weeks, is doing better now  after taking meds. Of note, he has been getting high BP readings lately, MD aware, and pt is continuing to track, They have been slightly lower as pain is decreaing in back.    Limitations Lifting;House hold activities;Standing;Sitting    Patient Stated Goals decreased pain    Currently in Pain? Yes    Pain Score 5     Pain Location Back    Pain Orientation Right    Pain Descriptors / Indicators Aching    Pain Type Acute pain    Pain Onset More than a month ago    Pain Frequency Intermittent    Aggravating Factors  putting socks on, bending, work duties              [facility] PT Assessment - 07/26/19 0001      Assessment   Medical Diagnosis Low back pain    Referring Provider (PT) Lynne Leader     Prior Therapy no      Balance Screen   Has the patient fallen in the past 6 months No      Prior Function   Level of Independence Independent      Cognition    Overall Cognitive Status Within Functional Limits for tasks assessed      ROM / Strength   AROM / PROM / Strength Strength;AROM      AROM   AROM Assessment Site Lumbar    Lumbar Flexion moderate limitatoin    Lumbar Extension mild limitation    Lumbar - Right Side Bend mod limitation    Lumbar - Left Side Bend mod limitation      Strength   Overall Strength Comments Hips: 4/5, Core: 3-/5       Palpation   Palpation comment Pain at R low lumbar region, paraspinals, QL and SI,; Tightness in bil thoracic and lumbar paraspinals, Bil hamstrings.       Special Tests   Other special tests Neg SLR today(but does have radicular pain into R anterior thigh)                       Objective measurements completed on examination: See above findings.       [facility] Adult PT Treatment/Exercise - 07/26/19 0001      Exercises   Exercises Lumbar      Lumbar Exercises: Stretches   Active Hamstring Stretch 2 reps;30 seconds    Active Hamstring Stretch Limitations seated    Single Knee to Chest Stretch 2 reps;30 seconds    Pelvic Tilt 15 reps    Piriformis Stretch 2 reps;30 seconds    Piriformis Stretch Limitations seated                    PT Short Term Goals - 07/26/19 1500      PT SHORT TERM GOAL #1   Title Pt to be independent with initial HEP    Time 2    Period Weeks    Status New    Target Date 08/07/19      PT SHORT TERM GOAL #2   Title Pt to report decreased pain in back and LE to 0-3/10 with activity    Time 2    Period Weeks    Status New    Target Date 08/07/19             PT Long Term Goals - 07/26/19 1501      PT LONG TERM GOAL #1   Title  Pt to be independent with final HEP    Time 6    Period Weeks    Status New    Target Date 09/04/19      PT LONG TERM GOAL #2   Title Pt to report decreased pain in low back to 0-2/10 with standing activity and work duties.    Time 6    Period Weeks    Status New    Target Date 09/04/19       PT LONG TERM GOAL #3   Title Pt to demo improved lumbar mobility to be [facility] for all motions without pain, to improve ability for IADLS, work duties, and donning shoes.    Time 6    Period Weeks    Status New    Target Date 09/04/19      PT LONG TERM GOAL #4   Title Pt to demo optimal mechanics for bend/lift/squat, to improve back pain and safety with work duties.    Time 6    Period Weeks    Status New    Target Date 09/04/19                  Plan - 07/26/19 1511    Clinical Impression Statement Pt presents with primary complaint of increased pain in low back into R LE and anterior thigh. Pt with improved pain since onset, but has relied on meds in last couple weeks for relief and for work duties. Pt with decreased lumbar ROM, and increaesd pain with bending, squatting, and donning shoes. He has decreased strength for core and hips and lack of effective HEP. Pt with decreased ability for full functional activities, IADLS, and work duties due to pain and deficits. Pt to benefit from skilled PT to improve.    Personal Factors and Comorbidities Comorbidity 1;Profession    Examination-Activity Limitations Bend;Squat;Stand;Lift;Locomotion Level    Examination-Participation Restrictions Cleaning;Community Activity;Driving;Laundry;Yard Work    Stability/Clinical Decision Making Stable/Uncomplicated    Clinical Decision Making Low    Rehab Potential Good    PT Frequency 2x / week    PT Duration 6 weeks    PT Treatment/Interventions ADLs/Self Care Home Management;Cryotherapy;Electrical Stimulation;Gait training;DME Instruction;Ultrasound;Traction;Moist Heat;Iontophoresis 4mg /ml Dexamethasone;Stair training;Functional mobility training;Therapeutic activities;Therapeutic exercise;Balance training;Neuromuscular re-education;Patient/family education;Manual techniques;Vasopneumatic Device;Taping;Dry needling;Passive range of motion;Energy conservation;Joint Manipulations;Spinal Manipulations     PT Home Exercise Plan [email or web address]    Consulted and Agree with Plan of Care Patient           Patient will benefit from skilled therapeutic intervention in order to improve the following deficits and impairments:  Decreased range of motion, Abnormal gait, Increased muscle spasms, Decreased activity tolerance, Decreased knowledge of precautions, Pain, Improper body mechanics, Impaired flexibility, Decreased mobility, Decreased strength  Visit Diagnosis: Radiculopathy, lumbar region     Problem List Patient Active Problem List   Diagnosis Date Noted  . Low back pain with sciatica 07/09/2019  . Constipation 01/05/2017  . Hyperlipidemia 01/05/2017  . Hypertension 01/05/2017  . ANKLE INJURY, RIGHT 09/22/2009  . HYPERLIPIDEMIA 10/27/2008  . ERECTILE DYSFUNCTION, ORGANIC 10/27/2008  . OSTEOARTHRITIS 10/27/2008  . ABDOMINAL PAIN, EPIGASTRIC 10/27/2008  . SKIN CANCER, HX OF 10/27/2008  . NEPHROLITHIASIS, HX OF 10/27/2008  . BASAL CELL CARCINOMA, FACE 10/27/2008    Lyndee Hensen, PT, DPT 3:56 PM  07/26/19    [facility] [place], [place], [address] Phone: [phone]   Fax:  [phone]  Name: Zyere Jiminez MRN: [id number] Date of Birth: 1950-09-27

## 2019-07-29 ENCOUNTER — Ambulatory Visit: Payer: PPO | Admitting: Physical Therapy

## 2019-07-29 ENCOUNTER — Encounter: Payer: Self-pay | Admitting: Physical Therapy

## 2019-07-29 ENCOUNTER — Other Ambulatory Visit: Payer: Self-pay

## 2019-07-29 DIAGNOSIS — M5441 Lumbago with sciatica, right side: Secondary | ICD-10-CM

## 2019-07-29 DIAGNOSIS — M6281 Muscle weakness (generalized): Secondary | ICD-10-CM

## 2019-07-29 NOTE — Therapy (Signed)
[facility] [address] [place], [place], [address] Phone: [phone]   Fax:  [phone]  Physical Therapy Treatment  Patient Details  Name: Dale Wood MRN: [id number] Date of Birth: 1950-02-07 Referring Provider (PT): Lynne Leader    Encounter Date: 07/29/2019   PT End of Session - 07/29/19 1751    Visit Number 2    Number of Visits 12    Date for PT Re-Evaluation 09/04/19    Authorization Type HTA    PT Start Time 1605    PT Stop Time 1650    PT Time Calculation (min) 45 min    Activity Tolerance Patient tolerated treatment well    Behavior During Therapy [facility] for tasks assessed/performed           Past Medical History:  Diagnosis Date  . Abdominal pain, epigastric 10/27/2008   Qualifier: Diagnosis of  By: Ronnald Ramp MD, [name].   Marland Kitchen ANKLE INJURY, RIGHT 09/22/2009   Qualifier: Diagnosis of  By: Nathaneil Canary, CMA, [name]    . BASAL CELL CARCINOMA, FACE 10/27/2008   Qualifier: Diagnosis of  By: Ronnald Ramp MD, [name].   Marland Kitchen BASAL CELL CARCINOMA, FACE 10/27/2008   Qualifier: Diagnosis of By: Ronnald Ramp MD, [name].   . Cancer ([place])    Skin Cancer on Nose  . CELLULITIS, FOOT 09/22/2009   Qualifier: Diagnosis of  By: Ronnald Ramp MD, [name].   . Constipation 01/05/2017  . DYSMETABOLIC SYNDROME 0/25/8527   Qualifier: Diagnosis of  By: Ronnald Ramp MD, [place] 10/27/2008   Qualifier: Diagnosis of  By: Ronnald Ramp MD, [name].   . ERECTILE DYSFUNCTION, ORGANIC 10/27/2008   Qualifier: Diagnosis of  By: Ronnald Ramp MD, [name].   . Headache 07/30/2015  . Heart murmur   . Hyperlipidemia   . HYPERLIPIDEMIA 10/27/2008   Qualifier: Diagnosis of  By: Ronnald Ramp MD, [name].   . Hypertension   . HYPOGONADISM 11/26/2008   Qualifier: Diagnosis of  By: Ronnald Ramp MD, [name].   . Kidney stones   . Low testosterone   . NEPHROLITHIASIS, HX OF 10/27/2008   Qualifier: Diagnosis of  By: Ronnald Ramp MD, [name].   . Nephrolithiasis, uric acid   . OSTEOARTHRITIS 10/27/2008    Qualifier: Diagnosis of  By: Ronnald Ramp MD, [name].   Marland Kitchen Positive TB test   . Postherpetic neuralgia 03/27/2017  . Rheumatic fever   . Rheumatic fever   . SKIN CANCER, HX OF 10/27/2008   Qualifier: Diagnosis of  By: Ronnald Ramp MD, [name].   . Tachycardia 11/09/2017  . Thyroid disease   . Unspecified disorder of kidney and ureter   . URI 02/05/2009   Qualifier: Diagnosis of  By: Ronnald Ramp MD, [name].     Past Surgical History:  Procedure Laterality Date  . KNEE SURGERY Right    2005, 2008  . TONSILLECTOMY AND ADENOIDECTOMY      There were no vitals filed for this visit.   Subjective Assessment - 07/29/19 1622    Subjective relays back pain is overall 4 to 5 out of 10 today mostly in the center of his back and into his Rt anterior pelvis. He relays he may have over done it with work. Says pain feels more like spasms in his back    Limitations Lifting;House hold activities;Standing;Sitting    Patient Stated Goals decreased pain    Pain Onset More than a month ago  [facility] Adult PT Treatment/Exercise - 07/29/19 0001      Lumbar Exercises: Stretches   Single Knee to Chest Stretch 2 reps;30 seconds    Pelvic Tilt 15 reps    Other Lumbar Stretch Exercise attemped prone series of starting prone lying for 2 minutes (no change in symptoms) then went to prone on elbows for 2 minutes (made pain worse) then moved to prone lying over pillow (also made pain worse then moved way from prone stretching      Lumbar Exercises: Supine   Clam 20 reps    Clam Limitations blue    Bridge Limitations 2 sets of 10 reps in comfortable ROM      Modalities   Modalities Traction      Traction   Type of Traction Lumbar    Min (lbs) 80    Max (lbs) 100    Hold Time 60    Rest Time 20    Time 20                    PT Short Term Goals - 07/26/19 1500      PT SHORT TERM GOAL #1   Title Pt to be independent with initial HEP    Time 2    Period Weeks     Status New    Target Date 08/07/19      PT SHORT TERM GOAL #2   Title Pt to report decreased pain in back and LE to 0-3/10 with activity    Time 2    Period Weeks    Status New    Target Date 08/07/19             PT Long Term Goals - 07/26/19 1501      PT LONG TERM GOAL #1   Title Pt to be independent with final HEP    Time 6    Period Weeks    Status New    Target Date 09/04/19      PT LONG TERM GOAL #2   Title Pt to report decreased pain in low back to 0-2/10 with standing activity and work duties.    Time 6    Period Weeks    Status New    Target Date 09/04/19      PT LONG TERM GOAL #3   Title Pt to demo improved lumbar mobility to be [facility] for all motions without pain, to improve ability for IADLS, work duties, and donning shoes.    Time 6    Period Weeks    Status New    Target Date 09/04/19      PT LONG TERM GOAL #4   Title Pt to demo optimal mechanics for bend/lift/squat, to improve back pain and safety with work duties.    Time 6    Period Weeks    Status New    Target Date 09/04/19                 Plan - 07/29/19 1645    Clinical Impression Statement trialed prone lying/mckenzie program but no relief from this and if anything maybe more pain. Then moved into more posterior pelvic tilt exercises and core strength with good tolerance. Trialed mechanical traction today for spinal decompression and PT will assess his overall response to this next session.    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    Rehab Potential Good    PT Frequency 2x / week    PT Duration 6 weeks    PT Treatment/Interventions ADLs/Self Care Home Management;Cryotherapy;Electrical Stimulation;Gait training;DME  Instruction;Ultrasound;Traction;Moist Heat;Iontophoresis 4mg /ml Dexamethasone;Stair training;Functional mobility training;Therapeutic activities;Therapeutic exercise;Balance training;Neuromuscular re-education;Patient/family education;Manual techniques;Vasopneumatic Device;Taping;Dry needling;Passive range of motion;Energy conservation;Joint Manipulations;Spinal Manipulations    PT Next Visit Plan how was traction?    PT Home Exercise Plan [id number]    Consulted and Agree with Plan of Care Patient           Patient will benefit from skilled therapeutic intervention in order to improve the following deficits and impairments:  Decreased range of motion, Abnormal gait, Increased muscle spasms, Decreased activity tolerance, Decreased knowledge of precautions, Pain, Improper body mechanics, Impaired flexibility, Decreased mobility, Decreased strength  Visit Diagnosis: Acute bilateral low back pain with right-sided sciatica  Muscle weakness (generalized)     Problem List Patient Active Problem List   Diagnosis Date Noted  . Low back pain with sciatica 07/09/2019  . Constipation 01/05/2017  . Hyperlipidemia 01/05/2017  . Hypertension 01/05/2017  . ANKLE INJURY, RIGHT 09/22/2009  . HYPERLIPIDEMIA 10/27/2008  . ERECTILE DYSFUNCTION, ORGANIC 10/27/2008  . OSTEOARTHRITIS 10/27/2008  . ABDOMINAL PAIN, EPIGASTRIC 10/27/2008  . SKIN CANCER, HX OF 10/27/2008  . NEPHROLITHIASIS, HX OF 10/27/2008  . BASAL CELL CARCINOMA, FACE 10/27/2008    Silvestre Mesi 07/29/2019, 4:47 PM  [facility] [address] [place], [place], [address] Phone: [phone]   Fax:  [phone]  Name: Erwin Nishiyama MRN: [id number] Date of Birth: Aug 05, 1950

## 2019-08-02 ENCOUNTER — Encounter: Payer: Self-pay | Admitting: Family Medicine

## 2019-08-02 ENCOUNTER — Other Ambulatory Visit: Payer: Self-pay

## 2019-08-02 ENCOUNTER — Ambulatory Visit (INDEPENDENT_AMBULATORY_CARE_PROVIDER_SITE_OTHER): Payer: PPO | Admitting: Family Medicine

## 2019-08-02 VITALS — BP 150/82 | HR 91 | Ht 72.0 in | Wt 266.2 lb

## 2019-08-02 DIAGNOSIS — M79651 Pain in right thigh: Secondary | ICD-10-CM

## 2019-08-02 DIAGNOSIS — S39012A Strain of muscle, fascia and tendon of lower back, initial encounter: Secondary | ICD-10-CM

## 2019-08-02 NOTE — Progress Notes (Signed)
   I, Wendy Poet, LAT, ATC, am serving as scribe for Dr. Lynne Leader.  Dale Wood is a 69 y.o. male who presents to [place] [facility] today for f/u of LBP and R LE pain.  He was last seen by Dr. Georgina Snell on 07/15/19 after being seen twice by primary care for similar symptoms.  He was prescribed Gabapentin and referred to outpatient PT of which he has completed 2 visits.  Since his last visit w/ Dr. Georgina Snell, pt reports that his low back and R leg pain has improved especially when he takes the Gabapentin and Diclofenac.  He now localizes the majority of his pain to his R lateral glute that radiates into his R thigh.  Diagnostic imaging: L-spine XR- 07/05/19  Pertinent review of systems: No fevers or chills  Relevant historical information: Hypertension thyroid disease   Exam:  BP (!) 150/82 (BP Location: Right Arm, Patient Position: Sitting, Cuff Size: Large)   Pulse 91   Ht 6' (1.829 m)   Wt 266 lb 3.2 oz (120.7 kg)   SpO2 94%   BMI 36.10 kg/m  General: Well Developed, well nourished, and in no acute distress.   MSK: L-spine normal-appearing nontender decreased motion. Right hip normal-appearing nontender at area of pain at buttocks and greater trochanter. Strength intact lower extremity.    Lab and Radiology Results  EXAM: LUMBAR SPINE - COMPLETE 4+ VIEW  COMPARISON:  CT scan March 22, 2018  FINDINGS: Mild anterior wedging of T11 is unchanged. No acute fractures or traumatic malalignment. Multilevel degenerative disc disease with anterior osteophytes. Lower lumbar facet degenerative changes. No other abnormalities.  IMPRESSION: Multilevel degenerative disc disease and lower lumbar facet degenerative changes.   Electronically Signed   By: Dorise Bullion III M.D   On: 07/06/2019 19:13 I, Lynne Leader, personally (independently) visualized and performed the interpretation of the images attached in this note.     Assessment and Plan: 69  y.o. male with right lumbar radiculopathy and spasm.  Significant improvement with conservative management including have a [place] diclofenac and physical therapy.  Still having some pain.  Is hard to tell now if his pain is more radicular or more trochanteric bursitis related.  Plan to continue physical therapy for about 4 more weeks.  If not better return to clinic would consider either greater trochanter injection or MRI for epidural steroid injection planning.     Discussed warning signs or symptoms. Please see discharge instructions. Patient expresses understanding.   The above documentation has been reviewed and is accurate and complete Lynne Leader, M.D.

## 2019-08-02 NOTE — Patient Instructions (Signed)
Thank you for coming in today. Continue the diclofenac and gabapentin.  Recheck in 4 weeks or so if not better.  Will try an injection into the side of the pain.

## 2019-08-05 ENCOUNTER — Ambulatory Visit: Payer: PPO | Admitting: Family Medicine

## 2019-08-06 ENCOUNTER — Encounter: Payer: Self-pay | Admitting: Physical Therapy

## 2019-08-06 ENCOUNTER — Encounter: Payer: Self-pay | Admitting: Family Medicine

## 2019-08-06 ENCOUNTER — Other Ambulatory Visit: Payer: Self-pay | Admitting: Family Medicine

## 2019-08-06 ENCOUNTER — Other Ambulatory Visit: Payer: Self-pay

## 2019-08-06 ENCOUNTER — Ambulatory Visit: Payer: PPO | Admitting: Physical Therapy

## 2019-08-06 DIAGNOSIS — M6281 Muscle weakness (generalized): Secondary | ICD-10-CM | POA: Diagnosis not present

## 2019-08-06 DIAGNOSIS — M5441 Lumbago with sciatica, right side: Secondary | ICD-10-CM

## 2019-08-06 NOTE — Therapy (Signed)
[place], [place], [address] Phone: [phone]   Fax:  [phone]  Physical Therapy Treatment  Patient Details  Name: Dale Wood MRN: [id number] Date of Birth: 15-Jun-1950 Referring Provider (PT): Lynne Leader    Encounter Date: 08/06/2019    Past Medical History:  Diagnosis Date  . Abdominal pain, epigastric 10/27/2008   Qualifier: Diagnosis of  By: Ronnald Ramp MD, [name].   Marland Kitchen ANKLE INJURY, RIGHT 09/22/2009   Qualifier: Diagnosis of  By: Nathaneil Canary, CMA, [name]    . BASAL CELL CARCINOMA, FACE 10/27/2008   Qualifier: Diagnosis of  By: Ronnald Ramp MD, [name].   Marland Kitchen BASAL CELL CARCINOMA, FACE 10/27/2008   Qualifier: Diagnosis of By: Ronnald Ramp MD, [name].   . Cancer ([place])    Skin Cancer on Nose  . CELLULITIS, FOOT 09/22/2009   Qualifier: Diagnosis of  By: Ronnald Ramp MD, [name].   . Constipation 01/05/2017  . DYSMETABOLIC SYNDROME 7/67/2094   Qualifier: Diagnosis of  By: Ronnald Ramp MD, [place] 10/27/2008   Qualifier: Diagnosis of  By: Ronnald Ramp MD, [name].   . ERECTILE DYSFUNCTION, ORGANIC 10/27/2008   Qualifier: Diagnosis of  By: Ronnald Ramp MD, [name].   . Headache 07/30/2015  . Heart murmur   . Hyperlipidemia   . HYPERLIPIDEMIA 10/27/2008   Qualifier: Diagnosis of  By: Ronnald Ramp MD, [name].   . Hypertension   . HYPOGONADISM 11/26/2008   Qualifier: Diagnosis of  By: Ronnald Ramp MD, [name].   . Kidney stones   . Low testosterone   . NEPHROLITHIASIS, HX OF 10/27/2008   Qualifier: Diagnosis of  By: Ronnald Ramp MD, [name].   . Nephrolithiasis, uric acid   . OSTEOARTHRITIS 10/27/2008   Qualifier: Diagnosis of  By: Ronnald Ramp MD, [name].   Marland Kitchen Positive TB test   . Postherpetic neuralgia 03/27/2017  . Rheumatic fever   . Rheumatic fever   . SKIN CANCER, HX OF 10/27/2008   Qualifier: Diagnosis of  By: Ronnald Ramp MD, [name].   . Tachycardia 11/09/2017  . Thyroid disease   . Unspecified disorder of kidney and ureter   . URI 02/05/2009    Qualifier: Diagnosis of  By: Ronnald Ramp MD, [name].     Past Surgical History:  Procedure Laterality Date  . KNEE SURGERY Right    2005, 2008  . TONSILLECTOMY AND ADENOIDECTOMY      There were no vitals filed for this visit.   Subjective Assessment - 08/06/19 1534    Subjective Pt arriving stating he didn't feel traction helped. Pt tolerating flexion based exercise program. Pt feels like his HEP is helping along with working out the [employer]. Continue skilled PT.    Limitations Lifting;House hold activities;Standing;Sitting    Patient Stated Goals decreased pain    Currently in Pain? Yes    Pain Score 4     Pain Location Back    Pain Orientation Right    Pain Descriptors / Indicators Aching;Sore    Pain Type Acute pain    Pain Onset More than a month ago                             [facility] Adult PT Treatment/Exercise - 08/06/19 0001      Exercises   Exercises Lumbar      Lumbar Exercises: Stretches   Single Knee to Chest Stretch 2 reps;30 seconds    Pelvic Tilt 15  reps    Piriformis Stretch 2 reps;20 seconds      Lumbar Exercises: Aerobic   Nustep L5 x 5 minutes      Lumbar Exercises: Supine   Clam 20 reps    Clam Limitations blue    Bridge 10 reps;5 seconds;Limitations    Bridge Limitations 2 sets with decreased lift off     Straight Leg Raise 10 reps;2 seconds;Limitations                    PT Short Term Goals - 07/26/19 1500      PT SHORT TERM GOAL #1   Title Pt to be independent with initial HEP    Time 2    Period Weeks    Status New    Target Date 08/07/19      PT SHORT TERM GOAL #2   Title Pt to report decreased pain in back and LE to 0-3/10 with activity    Time 2    Period Weeks    Status New    Target Date 08/07/19             PT Long Term Goals - 08/06/19 1549      PT LONG TERM GOAL #1   Title Pt to be independent with final HEP    Time 6    Period Weeks    Status On-going      PT LONG TERM GOAL #2   Title Pt  to report decreased pain in low back to 0-2/10 with standing activity and work duties.    Status On-going      PT LONG TERM GOAL #3   Title Pt to demo improved lumbar mobility to be [facility] for all motions without pain, to improve ability for IADLS, work duties, and donning shoes.    Status On-going      PT LONG TERM GOAL #4   Title Pt to demo optimal mechanics for bend/lift/squat, to improve back pain and safety with work duties.    Status Not Met                  Patient will benefit from skilled therapeutic intervention in order to improve the following deficits and impairments:     Visit Diagnosis: Acute bilateral low back pain with right-sided sciatica  Muscle weakness (generalized)     Problem List Patient Active Problem List   Diagnosis Date Noted  . Low back pain with sciatica 07/09/2019  . Constipation 01/05/2017  . Hyperlipidemia 01/05/2017  . Hypertension 01/05/2017  . ANKLE INJURY, RIGHT 09/22/2009  . HYPERLIPIDEMIA 10/27/2008  . ERECTILE DYSFUNCTION, ORGANIC 10/27/2008  . OSTEOARTHRITIS 10/27/2008  . ABDOMINAL PAIN, EPIGASTRIC 10/27/2008  . SKIN CANCER, HX OF 10/27/2008  . NEPHROLITHIASIS, HX OF 10/27/2008  . BASAL CELL CARCINOMA, FACE 10/27/2008    Oretha Caprice, PT, MPT 08/06/2019, 4:03 PM  [facility] [address] [place], [place], [address] Phone: [phone]   Fax:  [phone]  Name: Russell Quinney MRN: [id number] Date of Birth: [birth year]

## 2019-08-07 MED ORDER — CYCLOBENZAPRINE HCL 10 MG PO TABS: 10.0000 mg | ORAL_TABLET | Freq: Every evening | ORAL | 0 refills | Status: DC | PRN

## 2019-08-09 ENCOUNTER — Encounter: Payer: Self-pay | Admitting: Physical Therapy

## 2019-08-09 ENCOUNTER — Ambulatory Visit: Payer: PPO | Admitting: Physical Therapy

## 2019-08-09 ENCOUNTER — Other Ambulatory Visit: Payer: Self-pay

## 2019-08-09 DIAGNOSIS — M6281 Muscle weakness (generalized): Secondary | ICD-10-CM

## 2019-08-09 DIAGNOSIS — M5441 Lumbago with sciatica, right side: Secondary | ICD-10-CM

## 2019-08-09 MED ORDER — LOSARTAN POTASSIUM 100 MG PO TABS
100.0000 mg | ORAL_TABLET | Freq: Every day | ORAL | 1 refills | Status: DC
Start: 2019-08-09 — End: 2020-02-01

## 2019-08-09 NOTE — Therapy (Signed)
[facility] [address] [place], [place], [address] Phone: [phone]   Fax:  [phone]  Physical Therapy Treatment  Patient Details  Name: Dale Wood MRN: [id number] Date of Birth: 01/21/1950 Referring Provider (PT): Evan Corey    Encounter Date: 08/09/2019   PT End of Session - 08/09/19 1506    Visit Number 4    Number of Visits 12    Date for PT Re-Evaluation 09/04/19    Authorization Type HTA    PT Start Time 1505    PT Stop Time 1545    PT Time Calculation (min) 40 min    Activity Tolerance Patient tolerated treatment well    Behavior During Therapy WFL for tasks assessed/performed           Past Medical History:  Diagnosis Date  . Abdominal pain, epigastric 10/27/2008   Qualifier: Diagnosis of  By: Jones MD, Thomas L.   . ANKLE INJURY, RIGHT 09/22/2009   Qualifier: Diagnosis of  By: Douglas, CMA, [name]    . BASAL CELL CARCINOMA, FACE 10/27/2008   Qualifier: Diagnosis of  By: Jones MD, Thomas L.   . BASAL CELL CARCINOMA, FACE 10/27/2008   Qualifier: Diagnosis of By: Jones MD, Thomas L.   . Cancer ([facility])    Skin Cancer on Nose  . CELLULITIS, FOOT 09/22/2009   Qualifier: Diagnosis of  By: Jones MD, Thomas L.   . Constipation 01/05/2017  . DYSMETABOLIC SYNDROME 02/05/2009   Qualifier: Diagnosis of  By: Jones MD, Thomas L.   . DYSPEPSIA 10/27/2008   Qualifier: Diagnosis of  By: Jones MD, Thomas L.   . ERECTILE DYSFUNCTION, ORGANIC 10/27/2008   Qualifier: Diagnosis of  By: Jones MD, Thomas L.   . Headache 07/30/2015  . Heart murmur   . Hyperlipidemia   . HYPERLIPIDEMIA 10/27/2008   Qualifier: Diagnosis of  By: Jones MD, Thomas L.   . Hypertension   . HYPOGONADISM 11/26/2008   Qualifier: Diagnosis of  By: Jones MD, Thomas L.   . Kidney stones   . Low testosterone   . NEPHROLITHIASIS, HX OF 10/27/2008   Qualifier: Diagnosis of  By: Jones MD, Thomas L.   . Nephrolithiasis, uric acid   . OSTEOARTHRITIS 10/27/2008    Qualifier: Diagnosis of  By: Jones MD, Thomas L.   . Positive TB test   . Postherpetic neuralgia 03/27/2017  . Rheumatic fever   . Rheumatic fever   . SKIN CANCER, HX OF 10/27/2008   Qualifier: Diagnosis of  By: Jones MD, Thomas L.   . Tachycardia 11/09/2017  . Thyroid disease   . Unspecified disorder of kidney and ureter   . URI 02/05/2009   Qualifier: Diagnosis of  By: Jones MD, Thomas L.     Past Surgical History:  Procedure Laterality Date  . KNEE SURGERY Right    2005, 2008  . TONSILLECTOMY AND ADENOIDECTOMY      There were no vitals filed for this visit.   Subjective Assessment - 08/09/19 1507    Subjective "It's centered int he back of the R hip. The imaging states its arhtritis pressing on nerves. the gabapentin and dicolfenac sodium helps alot."    Patient Stated Goals decreased pain    Currently in Pain? Yes    Pain Score 3     Pain Location Back    Pain Descriptors / Indicators Aching    Pain Type Chronic pain                [facility] PT Assessment - 08/09/19 0001      Assessment   Medical Diagnosis Low back pain    Referring Provider (PT) Evan Corey       Flexibility   Soft Tissue Assessment /Muscle Length --      Special Tests    Special Tests Sacrolliac Tests    Sacroiliac Tests  Gaenslen's Test      Sacral thrust    Findings Positive    Side Right      Gaenslen's test   Findings Positive    Side  Right                         [facility] Adult PT Treatment/Exercise - 08/09/19 0001      Lumbar Exercises: Stretches   Active Hamstring Stretch 4 reps;30 seconds;Right   PNF contract/ relax   Passive Hamstring Stretch 2 reps;Right;30 seconds   before going from sit to stand     Lumbar Exercises: Supine   Straight Leg Raise 15 reps   x 2 sets on the R     Manual Therapy   Manual Therapy Joint mobilization;Other (comment);Muscle Energy Technique    Manual therapy comments piriformis trigger point release x 2    Joint Mobilization grade V  LAD RLE    Other Manual Therapy tack and stretch of piriformis using tennis ball in sitting    Muscle Energy Technique resisted R hip flexion and L hip extension 1 x 10 holding 10 seconds ea.                  PT Education - 08/09/19 1550    Education Details Reviewed HEp and updated today for SLR and LTR. discussed importance of hamstring stretching and to perform seated hamstring stretch every day and especially after he has been sitting for > 30 min. Reviewed anatomy of the SIJ and effects of surrounding musculature    Person(s) Educated Patient    Methods Explanation;Verbal cues;Handout    Comprehension Verbalized understanding;Verbal cues required            PT Short Term Goals - 08/06/19 1607      PT SHORT TERM GOAL #1   Title Pt to be independent with initial HEP    Status On-going      PT SHORT TERM GOAL #2   Title Pt to report decreased pain in back and LE to 0-3/10 with activity    Status On-going             PT Long Term Goals - 08/06/19 1549      PT LONG TERM GOAL #1   Title Pt to be independent with final HEP    Time 6    Period Weeks    Status On-going      PT LONG TERM GOAL #2   Title Pt to report decreased pain in low back to 0-2/10 with standing activity and work duties.    Status On-going      PT LONG TERM GOAL #3   Title Pt to demo improved lumbar mobility to be WFL for all motions without pain, to improve ability for IADLS, work duties, and donning shoes.    Status On-going      PT LONG TERM GOAL #4   Title Pt to demo optimal mechanics for bend/lift/squat, to improve back pain and safety with work duties.    Status Not Met                   Plan - 08/09/19 1551    Clinical Impression Statement pt reports pain today at 3/10 noting more relief secondary to medcation taken prior. pt demonstates singificant hamstring and posterior hip muscular most notably piriformis. He responded well to self lead trigger point release along  piriformis and hamstring stretch followed with R SLR suggesting potential SIJ invovement. Following session he noted decreased pain and reduced stiffness.    PT Treatment/Interventions ADLs/Self Care Home Management;Cryotherapy;Electrical Stimulation;Gait training;DME Instruction;Ultrasound;Traction;Moist Heat;Iontophoresis 4mg/ml Dexamethasone;Stair training;Functional mobility training;Therapeutic activities;Therapeutic exercise;Balance training;Neuromuscular re-education;Patient/family education;Manual techniques;Vasopneumatic Device;Taping;Dry needling;Passive range of motion;Energy conservation;Joint Manipulations;Spinal Manipulations    PT Next Visit Plan flexion based exercises, potentially SIJ involvment - hamstring stretching, hip flexor MET (if tolerated well) hip flexor activation, core strengthening.    Consulted and Agree with Plan of Care Patient           Patient will benefit from skilled therapeutic intervention in order to improve the following deficits and impairments:  Decreased range of motion, Abnormal gait, Increased muscle spasms, Decreased activity tolerance, Decreased knowledge of precautions, Pain, Improper body mechanics, Impaired flexibility, Decreased mobility, Decreased strength  Visit Diagnosis: Acute bilateral low back pain with right-sided sciatica  Muscle weakness (generalized)     Problem List Patient Active Problem List   Diagnosis Date Noted  . Low back pain with sciatica 07/09/2019  . Constipation 01/05/2017  . Hyperlipidemia 01/05/2017  . Hypertension 01/05/2017  . ANKLE INJURY, RIGHT 09/22/2009  . HYPERLIPIDEMIA 10/27/2008  . ERECTILE DYSFUNCTION, ORGANIC 10/27/2008  . OSTEOARTHRITIS 10/27/2008  . ABDOMINAL PAIN, EPIGASTRIC 10/27/2008  . SKIN CANCER, HX OF 10/27/2008  . NEPHROLITHIASIS, HX OF 10/27/2008  . BASAL CELL CARCINOMA, FACE 10/27/2008    Kristoffer Leamon PT, DPT, LAT, ATC  08/09/19  4:00 PM      [facility]  Physical Therapy [address] [place], [place], [address] Phone: [phone]   Fax:  [phone]  Name: Ceylon Lee Vetere MRN: [id number] Date of Birth: 01/11/1951   

## 2019-08-11 ENCOUNTER — Other Ambulatory Visit: Payer: Self-pay | Admitting: Family Medicine

## 2019-08-13 ENCOUNTER — Encounter: Payer: Self-pay | Admitting: Family Medicine

## 2019-08-23 ENCOUNTER — Other Ambulatory Visit: Payer: Self-pay

## 2019-08-23 ENCOUNTER — Encounter: Payer: Self-pay | Admitting: Rehabilitative and Restorative Service Providers"

## 2019-08-23 ENCOUNTER — Ambulatory Visit: Payer: PPO | Admitting: Rehabilitative and Restorative Service Providers"

## 2019-08-23 DIAGNOSIS — M6281 Muscle weakness (generalized): Secondary | ICD-10-CM | POA: Diagnosis not present

## 2019-08-23 DIAGNOSIS — M5441 Lumbago with sciatica, right side: Secondary | ICD-10-CM | POA: Diagnosis not present

## 2019-08-23 NOTE — Patient Instructions (Signed)
Access Code: [email or web address] URL: [email or web address] Date: 08/23/2019 Prepared by: Vista Mink  Exercises Standing Lumbar Extension at [place] 5 x daily - 7 x weekly - 1 sets - 5 reps - 3 hold Supine Hamstring Stretch - 2 x daily - 7 x weekly - 1 sets - 5 reps - 20 seconds hold Heel Toe Raises with Counter Support - 2-3 x daily - 7 x weekly - 1 sets - 10 reps - 3 seconds hold Standing Hip Hiking - 2 x daily - 7 x weekly - 2 sets - 10 reps - 3 seconds hold

## 2019-08-23 NOTE — Therapy (Signed)
[facility] Physical Therapy [address] , [place], [address] Phone: [phone]   Fax:  [phone]  Physical Therapy Treatment  Patient Details  Name: Dale Wood MRN: [id number] Date of Birth: 1950/04/07 Referring Provider (PT): Lynne Leader    Encounter Date: 08/23/2019   PT End of Session - 08/23/19 1605    Visit Number 5    Number of Visits 12    Date for PT Re-Evaluation 09/04/19    Authorization Type HTA    PT Start Time 1510    PT Stop Time 1550    PT Time Calculation (min) 40 min    Activity Tolerance Patient tolerated treatment well;No increased pain    Behavior During Therapy WFL for tasks assessed/performed           Past Medical History:  Diagnosis Date  . Abdominal pain, epigastric 10/27/2008   Qualifier: Diagnosis of  By: Ronnald Ramp MD, [name].   Marland Kitchen ANKLE INJURY, RIGHT 09/22/2009   Qualifier: Diagnosis of  By: Nathaneil Canary, CMA, [name]    . BASAL CELL CARCINOMA, FACE 10/27/2008   Qualifier: Diagnosis of  By: Ronnald Ramp MD, [name].   Marland Kitchen BASAL CELL CARCINOMA, FACE 10/27/2008   Qualifier: Diagnosis of By: Ronnald Ramp MD, [name].   . Cancer ([place])    Skin Cancer on Nose  . CELLULITIS, FOOT 09/22/2009   Qualifier: Diagnosis of  By: Ronnald Ramp MD, [name].   . Constipation 01/05/2017  . DYSMETABOLIC SYNDROME 8/89/1694   Qualifier: Diagnosis of  By: Ronnald Ramp MD, [place] 10/27/2008   Qualifier: Diagnosis of  By: Ronnald Ramp MD, [name].   . ERECTILE DYSFUNCTION, ORGANIC 10/27/2008   Qualifier: Diagnosis of  By: Ronnald Ramp MD, [name].   . Headache 07/30/2015  . Heart murmur   . Hyperlipidemia   . HYPERLIPIDEMIA 10/27/2008   Qualifier: Diagnosis of  By: Ronnald Ramp MD, [name].   . Hypertension   . HYPOGONADISM 11/26/2008   Qualifier: Diagnosis of  By: Ronnald Ramp MD, [name].   . Kidney stones   . Low testosterone   . NEPHROLITHIASIS, HX OF 10/27/2008   Qualifier: Diagnosis of  By: Ronnald Ramp MD, [name].   . Nephrolithiasis, uric acid   . OSTEOARTHRITIS  10/27/2008   Qualifier: Diagnosis of  By: Ronnald Ramp MD, [name].   Marland Kitchen Positive TB test   . Postherpetic neuralgia 03/27/2017  . Rheumatic fever   . Rheumatic fever   . SKIN CANCER, HX OF 10/27/2008   Qualifier: Diagnosis of  By: Ronnald Ramp MD, [name].   . Tachycardia 11/09/2017  . Thyroid disease   . Unspecified disorder of kidney and ureter   . URI 02/05/2009   Qualifier: Diagnosis of  By: Ronnald Ramp MD, [name].     Past Surgical History:  Procedure Laterality Date  . KNEE SURGERY Right    2005, 2008  . TONSILLECTOMY AND ADENOIDECTOMY      There were no vitals filed for this visit.   Subjective Assessment - 08/23/19 1601    Subjective Dale Wood notes improved R thigh/leg pain since starting PT.  His back is still limiting function.    Limitations Other (comment);Standing    Patient Stated Goals decreased pain    Currently in Pain? Yes    Pain Score 4     Pain Location Back    Pain Orientation Lower    Pain Descriptors / Indicators Aching;Sore    Pain Type Chronic pain    Pain Onset More than  a month ago    Pain Frequency Intermittent    Aggravating Factors  Bending and twisting    Pain Relieving Factors Exercise and walking    Effect of Pain on Daily Activities Has to be slow and careful at work.  Limited endurance.                             [place] Adult PT Treatment/Exercise - 08/23/19 0001      Therapeutic Activites    Therapeutic Activities ADL's   Log roll and walking prescription     Exercises   Exercises Lumbar      Lumbar Exercises: Stretches   Active Hamstring Stretch Left;Right;4 reps;20 seconds    Single Knee to Chest Stretch Left;Right;4 reps;20 seconds    Figure 4 Stretch 4 reps;20 seconds;With overpressure      Lumbar Exercises: Standing   Heel Raises 10 reps;3 seconds   2 sets of heel to toe raises with pelvic stabilization   Other Standing Lumbar Exercises Hip hike 2 sets of 10  3 seconds    Other Standing Lumbar Exercises Trunk extension  AROM 2 sets of 10 3 seconds                  PT Education - 08/23/19 1603    Education Details Updated HEP with more extension activities and core strengthening.  Spent time reviewing body mechanics including log roll and the importance of changing positions regularly.    Person(s) Educated Patient    Methods Explanation;Demonstration;Handout    Comprehension Verbalized understanding;Verbal cues required            PT Short Term Goals - 08/23/19 1604      PT SHORT TERM GOAL #1   Title Pt to be independent with initial HEP    Status On-going      PT SHORT TERM GOAL #2   Title Pt to report decreased pain in back and LE to 0-3/10 with activity    Status On-going             PT Long Term Goals - 08/23/19 1605      PT LONG TERM GOAL #1   Title Pt to be independent with final HEP    Time 6    Period Weeks    Status On-going      PT LONG TERM GOAL #2   Title Pt to report decreased pain in low back to 0-2/10 with standing activity and work duties.    Status On-going      PT LONG TERM GOAL #3   Title Pt to demo improved lumbar mobility to be [facility] for all motions without pain, to improve ability for IADLS, work duties, and donning shoes.    Status On-going      PT LONG TERM GOAL #4   Title Pt to demo optimal mechanics for bend/lift/squat, to improve back pain and safety with work duties.    Status On-going                 Plan - 08/23/19 1606    Clinical Impression Statement Dale Wood reports decreased peripheral pain since starting PT.  He will benefit from core strength progressions and gentle trunk extension AROM to reduce likely disc protrusions and allow him to return to regular work duties.  Body mechanics are also in need of continued work.    Personal Factors and Comorbidities Comorbidity 1;Profession    Examination-Activity  Limitations Bend;Squat;Stand;Lift;Locomotion Level    Examination-Participation Restrictions Cleaning;Community  Activity;Driving;Laundry;Yard Work    Rehab Potential Good    PT Frequency 2x / week    PT Duration 4 weeks    PT Treatment/Interventions ADLs/Self Care Home Management;Cryotherapy;Electrical Stimulation;Gait training;DME Instruction;Ultrasound;Traction;Moist Heat;Iontophoresis 4mg /ml Dexamethasone;Stair training;Functional mobility training;Therapeutic activities;Therapeutic exercise;Balance training;Neuromuscular re-education;Patient/family education;Manual techniques;Vasopneumatic Device;Taping;Dry needling;Passive range of motion;Energy conservation;Joint Manipulations;Spinal Manipulations    PT Next Visit Plan Progress core strength and body mechanics/awareness    PT Home Exercise Plan See patient instructions    Consulted and Agree with Plan of Care Patient           Patient will benefit from skilled therapeutic intervention in order to improve the following deficits and impairments:  Decreased range of motion, Abnormal gait, Increased muscle spasms, Decreased activity tolerance, Decreased knowledge of precautions, Pain, Improper body mechanics, Impaired flexibility, Decreased mobility, Decreased strength  Visit Diagnosis: Acute bilateral low back pain with right-sided sciatica  Muscle weakness (generalized)  Right-sided low back pain with right-sided sciatica, unspecified chronicity     Problem List Patient Active Problem List   Diagnosis Date Noted  . Low back pain with sciatica 07/09/2019  . Constipation 01/05/2017  . Hyperlipidemia 01/05/2017  . Hypertension 01/05/2017  . ANKLE INJURY, RIGHT 09/22/2009  . HYPERLIPIDEMIA 10/27/2008  . ERECTILE DYSFUNCTION, ORGANIC 10/27/2008  . OSTEOARTHRITIS 10/27/2008  . ABDOMINAL PAIN, EPIGASTRIC 10/27/2008  . SKIN CANCER, HX OF 10/27/2008  . NEPHROLITHIASIS, HX OF 10/27/2008  . BASAL CELL CARCINOMA, FACE 10/27/2008    Farley Ly PT, MPT 08/23/2019, 4:13 PM  [facility] Physical Therapy [address] [place], [place], [address] Phone: [phone]   Fax:  [phone]  Name: Dale Wood MRN: [id number] Date of Birth: 30-Jun-1950

## 2019-08-25 ENCOUNTER — Other Ambulatory Visit: Payer: Self-pay | Admitting: Family Medicine

## 2019-08-27 ENCOUNTER — Ambulatory Visit: Payer: PPO | Admitting: Physical Therapy

## 2019-08-27 ENCOUNTER — Telehealth: Payer: Self-pay | Admitting: Family Medicine

## 2019-08-27 ENCOUNTER — Other Ambulatory Visit: Payer: Self-pay

## 2019-08-27 ENCOUNTER — Encounter: Payer: PPO | Admitting: Family Medicine

## 2019-08-27 DIAGNOSIS — M6281 Muscle weakness (generalized): Secondary | ICD-10-CM | POA: Diagnosis not present

## 2019-08-27 DIAGNOSIS — M5441 Lumbago with sciatica, right side: Secondary | ICD-10-CM | POA: Diagnosis not present

## 2019-08-27 NOTE — Therapy (Addendum)
[facility] Physical Therapy [address] [place], [place], [address] Phone: [phone]   Fax:  [phone]  Physical Therapy Treatment/MD progress note  Progress Note reporting period 07/29/19 to 08/27/19  See below for objective and subjective measurements relating to patients progress with PT.   Patient Details  Name: Dale Wood MRN: [id number] Date of Birth: 02/19/1950 Referring Provider (PT): Lynne Leader    Encounter Date: 08/27/2019   PT End of Session - 08/27/19 1559    Visit Number 6    Number of Visits 12    Date for PT Re-Evaluation 09/04/19    Authorization Type HTA    PT Start Time 7371    PT Stop Time 0626    PT Time Calculation (min) 43 min    Activity Tolerance Patient tolerated treatment well;No increased pain    Behavior During Therapy WFL for tasks assessed/performed           Past Medical History:  Diagnosis Date  . Abdominal pain, epigastric 10/27/2008   Qualifier: Diagnosis of  By: Ronnald Ramp MD, [name].   Marland Kitchen ANKLE INJURY, RIGHT 09/22/2009   Qualifier: Diagnosis of  By: Nathaneil Canary, CMA, [name]    . BASAL CELL CARCINOMA, FACE 10/27/2008   Qualifier: Diagnosis of  By: Ronnald Ramp MD, [name].   Marland Kitchen BASAL CELL CARCINOMA, FACE 10/27/2008   Qualifier: Diagnosis of By: Ronnald Ramp MD, [name].   . Cancer ([place])    Skin Cancer on Nose  . CELLULITIS, FOOT 09/22/2009   Qualifier: Diagnosis of  By: Ronnald Ramp MD, [name].   . Constipation 01/05/2017  . DYSMETABOLIC SYNDROME 9/48/5462   Qualifier: Diagnosis of  By: Ronnald Ramp MD, [place] 10/27/2008   Qualifier: Diagnosis of  By: Ronnald Ramp MD, [name].   . ERECTILE DYSFUNCTION, ORGANIC 10/27/2008   Qualifier: Diagnosis of  By: Ronnald Ramp MD, [name].   . Headache 07/30/2015  . Heart murmur   . Hyperlipidemia   . HYPERLIPIDEMIA 10/27/2008   Qualifier: Diagnosis of  By: Ronnald Ramp MD, [name].   . Hypertension   . HYPOGONADISM 11/26/2008   Qualifier: Diagnosis of  By: Ronnald Ramp MD, [name].   . Kidney  stones   . Low testosterone   . NEPHROLITHIASIS, HX OF 10/27/2008   Qualifier: Diagnosis of  By: Ronnald Ramp MD, [name].   . Nephrolithiasis, uric acid   . OSTEOARTHRITIS 10/27/2008   Qualifier: Diagnosis of  By: Ronnald Ramp MD, [name].   Marland Kitchen Positive TB test   . Postherpetic neuralgia 03/27/2017  . Rheumatic fever   . Rheumatic fever   . SKIN CANCER, HX OF 10/27/2008   Qualifier: Diagnosis of  By: Ronnald Ramp MD, [name].   . Tachycardia 11/09/2017  . Thyroid disease   . Unspecified disorder of kidney and ureter   . URI 02/05/2009   Qualifier: Diagnosis of  By: Ronnald Ramp MD, [name].     Past Surgical History:  Procedure Laterality Date  . KNEE SURGERY Right    2005, 2008  . TONSILLECTOMY AND ADENOIDECTOMY      There were no vitals filed for this visit.   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 He is no longer having back spasms. He feels hamstring flexability has improved some. He relays overall about 2-3/10 back pain upon arrival.    Limitations Other (comment);Standing    Patient Stated Goals decreased  pain    Pain Onset More than a month ago              [facility] PT Assessment - 08/27/19 0001      Assessment   Medical Diagnosis Low back pain    Referring Provider (PT) Lynne Leader       AROM   Lumbar Flexion 75%    Lumbar Extension 50%    Lumbar - Right Side Bend 75%    Lumbar - Left Side Bend 75%    Lumbar - Right Rotation 75%    Lumbar - Left Rotation 75%      Strength   Overall Strength Comments core strength 4/5                         [facility] Adult PT Treatment/Exercise - 08/27/19 0001      Lumbar Exercises: Stretches   Active Hamstring Stretch Left;Right;4 reps;20 seconds    Single Knee to Chest Stretch --    Figure 4 Stretch 2 reps;30 seconds    Gastroc Stretch Right;Left;3 reps;30 seconds    Gastroc Stretch Limitations slantboard    Other Lumbar Stretch  Exercise seated Pball roll outs into lumbar flexion 10 sec X 10      Lumbar Exercises: Aerobic   Recumbent Bike L3X 5 min      Lumbar Exercises: Machines for Strengthening   Other Lumbar Machine Exercise Lat pulldown 55 lbs 2X15    Other Lumbar Machine Exercise seated row machine 75 lbs 2X15      Lumbar Exercises: Standing   Shoulder Extension Both;20 reps;Theraband    Theraband Level (Shoulder Extension) Level 4 (Blue)    Other Standing Lumbar Exercises piloff press/anti rotation 5 sec X 15 reps bilat    Other Standing Lumbar Exercises Trunk extension AROM 10 reps 3 seconds                    PT Short Term Goals - 08/27/19 1632      PT SHORT TERM GOAL #1   Title Pt to be independent with initial HEP    Status Achieved      PT SHORT TERM GOAL #2   Title Pt to report decreased pain in back and LE to 0-3/10 with activity    Status On-going             PT Long Term Goals - 08/27/19 1613      PT LONG TERM GOAL #1   Title Pt to be independent with final HEP    Baseline independent with starter program    Time 6    Period Weeks    Status Achieved      PT LONG TERM GOAL #2   Title Pt to report decreased pain in low back to 0-2/10 with standing activity and work duties.    Baseline pain between 3-6 today    Status On-going      PT LONG TERM GOAL #3   Title Pt to demo improved lumbar mobility to be [facility] for all motions without pain, to improve ability for IADLS, work duties, and donning shoes.    Status Partially Met      PT LONG TERM GOAL #4   Title Pt to demo optimal mechanics for bend/lift/squat, to improve back pain and safety with work duties.    Status Achieved                 Plan -  08/27/19 1626    Clinical Impression Statement Overall pain appears to be better and not having radiculoapthy since starting PT. He has improved strength and ROM but still with mild deficits in lumbar ROM and core strength. He will follow up with MD tommorow and will  decide if he wants to return to PT.    Personal Factors and Comorbidities Comorbidity 1;Profession    Examination-Activity Limitations Bend;Squat;Stand;Lift;Locomotion Level    Examination-Participation Restrictions Cleaning;Community Activity;Driving;Laundry;Yard Work    Rehab Potential Good    PT Frequency 2x / week    PT Duration 4 weeks    PT Treatment/Interventions ADLs/Self Care Home Management;Cryotherapy;Electrical Stimulation;Gait training;DME Instruction;Ultrasound;Traction;Moist Heat;Iontophoresis 70m/ml Dexamethasone;Stair training;Functional mobility training;Therapeutic activities;Therapeutic exercise;Balance training;Neuromuscular re-education;Patient/family education;Manual techniques;Vasopneumatic Device;Taping;Dry needling;Passive range of motion;Energy conservation;Joint Manipulations;Spinal Manipulations    PT Next Visit Plan he may discharge after MD visit tommorw but if he return then , Progress core strength and body mechanics/awareness    PT Home Exercise Plan See patient instructions    Consulted and Agree with Plan of Care Patient           Patient will benefit from skilled therapeutic intervention in order to improve the following deficits and impairments:  Decreased range of motion, Abnormal gait, Increased muscle spasms, Decreased activity tolerance, Decreased knowledge of precautions, Pain, Improper body mechanics, Impaired flexibility, Decreased mobility, Decreased strength  Visit Diagnosis: Acute bilateral low back pain with right-sided sciatica  Muscle weakness (generalized)  Right-sided low back pain with right-sided sciatica, unspecified chronicity     Problem List Patient Active Problem List   Diagnosis Date Noted  . Low back pain with sciatica 07/09/2019  . Constipation 01/05/2017  . Hyperlipidemia 01/05/2017  . Hypertension 01/05/2017  . ANKLE INJURY, RIGHT 09/22/2009  . HYPERLIPIDEMIA 10/27/2008  . ERECTILE DYSFUNCTION, ORGANIC 10/27/2008   . OSTEOARTHRITIS 10/27/2008  . ABDOMINAL PAIN, EPIGASTRIC 10/27/2008  . SKIN CANCER, HX OF 10/27/2008  . NEPHROLITHIASIS, HX OF 10/27/2008  . BASAL CELL CARCINOMA, FACE 10/27/2008    BDebbe Odea8/10/2019, 4:35 PM  [facility] [address] [address]Phone: [phone]  Fax:  [phone] Name: RAdin LakerMRN: [id number] of Birth: 808-25-1952  PHYSICAL THERAPY DISCHARGE SUMMARY  Visits from Start of Care: 6  Current functional level related to goals / functional outcomes: See note   Remaining deficits: See note   Education / Equipment: Home exercise program Plan: Patient agrees to discharge.  Patient goals were met. Patient is being discharged due to meeting the stated rehab goals.  ?????     RNathanial RancherPT, MPT

## 2019-08-27 NOTE — Progress Notes (Signed)
°  Chronic Care Management   Note  08/27/2019 Name: Dale Wood MRN: [id number] DOB: [birth year]  Dale Wood is a 69 y.o. year old male who is a primary care patient of Vivi Barrack, MD. I reached out to [place] by phone today in response to a referral sent by [name]'s PCP, Vivi Barrack, MD.   Dale Wood was given information about Chronic Care Management services today including:  1. CCM service includes personalized support from designated clinical staff supervised by his physician, including individualized plan of care and coordination with other care providers 2. 24/7 contact phone numbers for assistance for urgent and routine care needs. 3. Service will only be billed when office clinical staff spend 20 minutes or more in a month to coordinate care. 4. Only one practitioner may furnish and bill the service in a calendar month. 5. The patient may stop CCM services at any time (effective at the end of the month) by phone call to the office staff.   Patient agreed to services and verbal consent obtained.   Follow up plan:   Dale Wood Upstream Scheduler

## 2019-08-28 ENCOUNTER — Ambulatory Visit (INDEPENDENT_AMBULATORY_CARE_PROVIDER_SITE_OTHER): Payer: PPO | Admitting: Family Medicine

## 2019-08-28 ENCOUNTER — Other Ambulatory Visit: Payer: Self-pay

## 2019-08-28 ENCOUNTER — Encounter: Payer: Self-pay | Admitting: Family Medicine

## 2019-08-28 VITALS — BP 150/90 | HR 81 | Ht 72.0 in | Wt 268.8 lb

## 2019-08-28 DIAGNOSIS — M5442 Lumbago with sciatica, left side: Secondary | ICD-10-CM

## 2019-08-28 MED ORDER — CYCLOBENZAPRINE HCL 10 MG PO TABS: 10.0000 mg | ORAL_TABLET | Freq: Every evening | ORAL | 0 refills | Status: DC | PRN

## 2019-08-28 NOTE — Progress Notes (Signed)
   I, Dale Wood, LAT, ATC, am serving as scribe for Dr. Lynne Leader.  Rosendo Couser is a 69 y.o. male who presents to [facility] today for f/u of low back pain and R LE pain.  He was last seen by Dr. Georgina Snell on 08/02/19 and was advised to con't PT of which he has completed 6 visits and to con't taking Gabapentin and Diclofenac.  Since his last visit, pt reports that overall he's doing better.  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  He rates his improvement at 50% or better.  Diagnostic imaging: L-spine XR- 07/05/19  Pertinent review of systems: No fevers or chills  Relevant historical information: Hypertension   Exam:  BP (!) 150/90 (BP Location: Right Arm, Patient Position: Sitting, Cuff Size: Large)   Pulse 81   Ht 6' (1.829 m)   Wt 268 lb 12.8 oz (121.9 kg)   SpO2 94%   BMI 36.46 kg/m  General: Well Developed, well nourished, and in no acute distress.   MSK: L-spine nontender normal lumbar motion Lower extremity strength is intact.     Assessment and Plan: 69 y.o. male with lumbar radiculopathy and back pain.  Significant improvement with physical therapy.  Patient notes that he is feeling well enough now that he does not think MRI and epidural steroid injection make any kind of sense.  Plan to continue PT and home exercise program.  Try to wean off of diclofenac orally as this may be raising his blood pressure.  If symptoms worsen then will make sense to proceed with MRI for injection planning.  Otherwise recheck back as needed.   PDMP not reviewed this encounter. No orders of the defined types were placed in this encounter.  Meds ordered this encounter  Medications  . cyclobenzaprine (FLEXERIL) 10 MG tablet    Sig: Take 1 tablet (10 mg total) by mouth at bedtime as needed for muscle spasms.    Dispense:  30 tablet    Refill:  0      Discussed warning signs or symptoms. Please see discharge instructions. Patient expresses understanding.   The above documentation has been reviewed and is accurate and complete Lynne Leader, M.D.

## 2019-08-28 NOTE — Patient Instructions (Signed)
Thank you for coming in today. Let me know if you pain gets bad enough to do a MRI and then back injection.  I will order the MRI with medicines for anxiety.  Otherwise continue your exercise and PT until it weans off.   Wean off the diclofenac if you can.   Use the gabapentin as needed.

## 2019-08-29 ENCOUNTER — Ambulatory Visit (INDEPENDENT_AMBULATORY_CARE_PROVIDER_SITE_OTHER): Payer: PPO | Admitting: Family Medicine

## 2019-08-29 ENCOUNTER — Encounter: Payer: Self-pay | Admitting: Family Medicine

## 2019-08-29 ENCOUNTER — Other Ambulatory Visit: Payer: Self-pay

## 2019-08-29 VITALS — BP 154/82 | HR 85 | Temp 97.0°F | Ht 72.0 in | Wt 268.0 lb

## 2019-08-29 DIAGNOSIS — E785 Hyperlipidemia, unspecified: Secondary | ICD-10-CM

## 2019-08-29 DIAGNOSIS — M5441 Lumbago with sciatica, right side: Secondary | ICD-10-CM | POA: Diagnosis not present

## 2019-08-29 DIAGNOSIS — I10 Essential (primary) hypertension: Secondary | ICD-10-CM

## 2019-08-29 DIAGNOSIS — Z0001 Encounter for general adult medical examination with abnormal findings: Secondary | ICD-10-CM

## 2019-08-29 DIAGNOSIS — Z125 Encounter for screening for malignant neoplasm of prostate: Secondary | ICD-10-CM

## 2019-08-29 DIAGNOSIS — R739 Hyperglycemia, unspecified: Secondary | ICD-10-CM | POA: Diagnosis not present

## 2019-08-29 NOTE — Assessment & Plan Note (Signed)
Check A1c. 

## 2019-08-29 NOTE — Assessment & Plan Note (Signed)
Doing better.  Continue management plan per sports medicine.

## 2019-08-29 NOTE — Progress Notes (Signed)
Chief Complaint:  Dale [name] is a 69 y.o. male who presents today for his annual comprehensive physical exam.    Assessment/Plan:  Chronic Problems Addressed Today: Low back [name] with sciatica Doing better.  Continue management plan per sports medicine.  Hypertension Slightly above goal.  Continue losartan 100 mg daily.  Continue home monitoring goal 150/90 or lower.  Check CBC, CMET, TSH.  Hyperlipidemia Check lipid panel.  Hyperglycemia Check A1c.  Preventative Healthcare: Check CBC, CMET, TSH, lipid panel.  Check PSA.  Due for colon cancer screening next year.  Patient Counseling(The following topics were reviewed and/or handout was given):  -Nutrition: Stressed importance of moderation in sodium/caffeine intake, saturated fat and cholesterol, caloric balance, sufficient intake of fresh fruits, vegetables, and fiber.  -Stressed the importance of regular exercise.   -Substance Abuse: Discussed cessation/primary prevention of tobacco, alcohol, or other drug use; driving or other dangerous activities under the influence; availability of treatment for abuse.   -Injury prevention: Discussed safety belts, safety helmets, smoke detector, smoking near bedding or upholstery.   -Sexuality: Discussed sexually transmitted diseases, partner selection, use of condoms, avoidance of unintended pregnancy and contraceptive alternatives.   -Dental health: Discussed importance of regular tooth brushing, flossing, and dental visits.  -Health maintenance and immunizations reviewed. Please refer to Health maintenance section.  Return to care in 1 year for next preventative visit.     Subjective:  HPI:  He has no acute complaints today.   Lifestyle Diet: Balanced.  Exercise: Working at [name] three times per week.   Depression screen PHQ 2/9 09/18/2018  Decreased Interest 0  Down, Depressed, Hopeless 0  PHQ - 2 Score 0    Health Maintenance Due  Topic Date Due   COVID-19 Vaccine  (1) Never done     ROS: Per HPI, otherwise a complete review of systems was negative.   PMH:  The following were reviewed and entered/updated in epic: Past Medical History:  Diagnosis Date   Abdominal [name], epigastric 10/27/2008   Qualifier: Diagnosis of  By: Dale Ramp MD, [place], [name] 09/22/2009   Qualifier: Diagnosis of  By: Dale [name], CMA, Dale [name]     BASAL CELL CARCINOMA, [place] 10/27/2008   Qualifier: Diagnosis of  By: Dale Ramp MD, Dale [name].    BASAL CELL CARCINOMA, FACE 10/27/2008   Qualifier: Diagnosis of By: Dale Ramp MD, [name].    Cancer [name])    Skin Cancer on Nose   CELLULITIS, FOOT 09/22/2009   Qualifier: Diagnosis of  By: Dale Ramp MD, Dale [name].    Constipation 54/00/8676   DYSMETABOLIC SYNDROME 1/95/0932   Qualifier: Diagnosis of  By: Dale Ramp MD, [place] 10/27/2008   Qualifier: Diagnosis of  By: Dale Ramp MD, [name].    ERECTILE DYSFUNCTION, ORGANIC 10/27/2008   Qualifier: Diagnosis of  By: Dale Ramp MD, Dale [name].    Headache 07/30/2015   Heart murmur    Hyperlipidemia    HYPERLIPIDEMIA 10/27/2008   Qualifier: Diagnosis of  By: Dale Ramp MD, Dale [name].    Hypertension    HYPOGONADISM 11/26/2008   Qualifier: Diagnosis of  By: Dale Ramp MD, Dale [name].    Kidney stones    Low testosterone    NEPHROLITHIASIS, HX OF 10/27/2008   Qualifier: Diagnosis of  By: Dale Ramp MD, [name].    Nephrolithiasis, uric acid    OSTEOARTHRITIS 10/27/2008   Qualifier: Diagnosis of  By: Dale Ramp MD, Dale [name].    Positive TB test  Postherpetic neuralgia 03/27/2017   Rheumatic fever    Rheumatic fever    SKIN CANCER, HX OF 10/27/2008   Qualifier: Diagnosis of  By: Dale Ramp MD, [name].    Tachycardia 11/09/2017   Thyroid disease    Unspecified disorder of kidney and ureter    URI 02/05/2009   Qualifier: Diagnosis of  By: Dale Ramp MD, Dale [name].    Patient Active Problem List   Diagnosis Date Noted   Hyperglycemia 08/29/2019   Low back [name] with sciatica  07/09/2019   Hyperlipidemia 01/05/2017   Hypertension 01/05/2017   ANKLE INJURY, [name] 09/22/2009   ERECTILE DYSFUNCTION, ORGANIC 10/27/2008   OSTEOARTHRITIS 10/27/2008   ABDOMINAL [name], EPIGASTRIC 10/27/2008   SKIN CANCER, HX OF 10/27/2008   NEPHROLITHIASIS, HX OF 10/27/2008   BASAL CELL CARCINOMA, FACE 10/27/2008   Past Surgical History:  Procedure Laterality Date   KNEE SURGERY [name]    2005, 2008   TONSILLECTOMY AND ADENOIDECTOMY      Family History  Problem Relation Age of Onset   Cancer Mother    Diabetes Mother    Cancer Father    Early death Father    Diabetes Brother    Cancer Brother        prostate   Drug abuse Brother    Birth defects Sister    Diabetes Maternal Grandmother    Thyroid disease Maternal Grandmother    Stroke Maternal Grandmother    Tremor Maternal Grandfather    Obesity Son     Medications- reviewed and updated Current Outpatient Medications  Medication Sig Dispense Refill   acetaminophen (TYLENOL) 325 MG tablet Take 650 mg by mouth every 6 (six) hours as needed.     cyclobenzaprine (FLEXERIL) 10 MG tablet Take 1 tablet (10 mg total) by mouth at bedtime as needed for muscle spasms. 30 tablet 0   diclofenac (VOLTAREN) 75 MG EC tablet TAKE 1 TABLET(75 MG) BY MOUTH TWICE DAILY 30 tablet 0   gabapentin (NEURONTIN) 300 MG capsule Take 1 capsule (300 mg total) by mouth 3 (three) times daily as needed (nerve [name]). 90 capsule 3   losartan (COZAAR) 100 MG tablet Take 1 tablet (100 mg total) by mouth daily. 90 tablet 1   sildenafil (VIAGRA) 100 MG tablet Take 0.5-1 tablets (50-100 mg total) by mouth daily as needed for erectile dysfunction. 30 tablet 5   No current facility-administered medications for this visit.    Allergies-reviewed and updated Allergies  Allergen Reactions   Androgel [Testosterone]     *Pains in Chest/Axillary area*   Penicillins     *Does Not Work*    Social History   Socioeconomic  History   Marital status: Married    Spouse name: Not on file   Number of children: Not on file   Years of education: Not on file   Highest education level: Not on file  Occupational History   Not on file  Tobacco Use   Smoking status: Former Smoker    Types: Pipe, Cigars   Smokeless tobacco: Never Used  Scientific laboratory technician Use: Never used  Substance and Sexual Activity   Alcohol use: Yes    Alcohol/week: 1.0 standard drink    Types: 1 Cans of beer per week    Comment: occssionally   Drug use: No   Sexual activity: Yes  Other Topics Concern   Not on file  Social History Narrative   Not on file   Social Determinants of Health   Financial  Resource Strain:    Difficulty of Paying Living Expenses:   Food Insecurity:    Worried About Charity fundraiser in the Last Year:    Arboriculturist in the Last Year:   Transportation Needs:    Film/video editor (Medical):    Lack of Transportation (Non-Medical):   Physical Activity:    Days of Exercise per Week:    Minutes of Exercise per Session:   Stress:    Feeling of Stress :   Social Connections:    Frequency of Communication with Friends and Family:    Frequency of Social Gatherings with Friends and Family:    Attends Religious Services:    Active Member of Clubs or Organizations:    Attends Music therapist:    Marital Status:         Objective:  Physical Exam: BP (!) 154/82    Pulse 85    Temp (!) 97 F (36.1 C)    Ht 6' (1.829 m)    Wt 268 lb (121.6 kg)    SpO2 94%    BMI 36.35 kg/m   Body mass index is 36.35 kg/m. Wt Readings from Last 3 Encounters:  08/29/19 268 lb (121.6 kg)  08/28/19 268 lb 12.8 oz (121.9 kg)  08/02/19 266 lb 3.2 oz (120.7 kg)   Gen: NAD, resting comfortably HEENT: TMs normal bilaterally. OP clear. No thyromegaly noted.  CV: RRR with no murmurs appreciated Pulm: NWOB, CTAB with no crackles, wheezes, or rhonchi GI: Normal bowel sounds  present. Soft, Nontender, Nondistended. MSK: no edema, cyanosis, or clubbing noted Skin: warm, dry Neuro: CN2-12 grossly intact. Strength 5/5 in upper and lower extremities. Reflexes symmetric and intact bilaterally.  Psych: Normal affect and thought content     Dale Floren M. [name], MD 08/29/2019 3:37 PM

## 2019-08-29 NOTE — Patient Instructions (Addendum)
It was very nice to see you today!  No changes today.  We will check blood work.  Keep an eye on your blood pressure and let me know if it is persistently elevated 150/90 or higher.  I'll see you back in year for your next physical.  Take care, Dr [name]  Please try these tips to maintain a healthy lifestyle:   Eat at least 3 REAL meals and 1-2 snacks per day.  Aim for no more than 5 hours between eating.  If you eat breakfast, please do so within one hour of getting up.    Each meal should contain half fruits/vegetables, one quarter protein, and one quarter carbs (no bigger than a computer mouse)   Cut down on sweet beverages. This includes juice, soda, and sweet tea.     Drink at least 1 glass of water with each meal and aim for at least 8 glasses per day   Exercise at least 150 minutes every week.    Preventive Care 68 Years and Older, Male Preventive care refers to lifestyle choices and visits with your health care provider that can promote health and wellness. This includes:  A yearly physical exam. This is also called an annual well check.  Regular dental and eye exams.  Immunizations.  Screening for certain conditions.  Healthy lifestyle choices, such as diet and exercise. What can I expect for my preventive care visit? Physical exam Your health care provider will check:  Height and weight. These may be used to calculate body mass index (BMI), which is a measurement that tells if you are at a healthy weight.  Heart rate and blood pressure.  Your skin for abnormal spots. Counseling Your health care provider may ask you questions about:  Alcohol, tobacco, and drug use.  Emotional well-being.  Home and relationship well-being.  Sexual activity.  Eating habits.  History of falls.  Memory and ability to understand (cognition).  Work and work Statistician. What immunizations do I need?  Influenza (flu) vaccine  This is recommended every  year. Tetanus, diphtheria, and pertussis (Tdap) vaccine  You may need a Td booster every 10 years. Varicella (chickenpox) vaccine  You may need this vaccine if you have not already been vaccinated. Zoster (shingles) vaccine  You may need this after age 11. Pneumococcal conjugate (PCV13) vaccine  One dose is recommended after age 53. Pneumococcal polysaccharide (PPSV23) vaccine  One dose is recommended after age 72. Measles, mumps, and rubella (MMR) vaccine  You may need at least one dose of MMR if you were born in 1957 or later. You may also need a second dose. Meningococcal conjugate (MenACWY) vaccine  You may need this if you have certain conditions. Hepatitis A vaccine  You may need this if you have certain conditions or if you travel or work in places where you may be exposed to hepatitis A. Hepatitis B vaccine  You may need this if you have certain conditions or if you travel or work in places where you may be exposed to hepatitis B. Haemophilus influenzae type b (Hib) vaccine  You may need this if you have certain conditions. You may receive vaccines as individual doses or as more than one vaccine together in one shot (combination vaccines). Talk with your health care provider about the risks and benefits of combination vaccines. What tests do I need? Blood tests  Lipid and cholesterol levels. These may be checked every 5 years, or more frequently depending on your overall health.  Hepatitis C test.  Hepatitis B test. Screening  Lung cancer screening. You may have this screening every year starting at age 62 if you have a 30-pack-year history of smoking and currently smoke or have quit within the past 15 years.  Colorectal cancer screening. All adults should have this screening starting at age 105 and continuing until age 88. Your health care provider may recommend screening at age 36 if you are at increased risk. You will have tests every 1-10 years, depending on  your results and the type of screening test.  Prostate cancer screening. Recommendations will vary depending on your family history and other risks.  Diabetes screening. This is done by checking your blood sugar (glucose) after you have not eaten for a while (fasting). You may have this done every 1-3 years.  Abdominal aortic aneurysm (AAA) screening. You may need this if you are a current or former smoker.  Sexually transmitted disease (STD) testing. Follow these instructions at home: Eating and drinking  Eat a diet that includes fresh fruits and vegetables, whole grains, lean protein, and low-fat dairy products. Limit your intake of foods with high amounts of sugar, saturated fats, and salt.  Take vitamin and mineral supplements as recommended by your health care provider.  Do not drink alcohol if your health care provider tells you not to drink.  If you drink alcohol: ? Limit how much you have to 0-2 drinks a day. ? Be aware of how much alcohol is in your drink. In the [place] one drink equals one 12 oz bottle of beer (355 mL), one 5 oz glass of wine (148 mL), or one 1 oz glass of hard liquor (44 mL). Lifestyle  Take daily care of your teeth and gums.  Stay active. Exercise for at least 30 minutes on 5 or more days each week.  Do not use any products that contain nicotine or tobacco, such as cigarettes, e-cigarettes, and chewing tobacco. If you need help quitting, ask your health care provider.  If you are sexually active, practice safe sex. Use a condom or other form of protection to prevent STIs (sexually transmitted infections).  Talk with your health care provider about taking a low-dose aspirin or statin. What's next?  Visit your health care provider once a year for a well check visit.  Ask your health care provider how often you should have your eyes and teeth checked.  Stay up to date on all vaccines. This information is not intended to replace advice given to you by  your health care provider. Make sure you discuss any questions you have with your health care provider. Document Revised: 12/28/2017 Document Reviewed: 12/28/2017 Elsevier Patient Education  2020 [employer].

## 2019-08-29 NOTE — Assessment & Plan Note (Signed)
Check lipid panel  

## 2019-08-29 NOTE — Assessment & Plan Note (Signed)
Slightly above goal.  Continue losartan 100 mg daily.  Continue home monitoring goal 150/90 or lower.  Check CBC, CMET, TSH.

## 2019-08-30 LAB — CBC
HCT: 37.7 % — ABNORMAL LOW (ref 38.5–50.0)
Hemoglobin: 12.2 g/dL — ABNORMAL LOW (ref 13.2–17.1)
MCH: 27.2 pg (ref 27.0–33.0)
MCHC: 32.4 g/dL (ref 32.0–36.0)
MCV: 84 fL (ref 80.0–100.0)
MPV: 9.9 fL (ref 7.5–12.5)
Platelets: 336 10*3/uL (ref 140–400)
RBC: 4.49 10*6/uL (ref 4.20–5.80)
RDW: 16.7 % — ABNORMAL HIGH (ref 11.0–15.0)
WBC: 6.4 10*3/uL (ref 3.8–10.8)

## 2019-08-30 LAB — LIPID PANEL
Cholesterol: 237 mg/dL — ABNORMAL HIGH (ref <200)
HDL: 29 mg/dL — ABNORMAL LOW (ref >=40)
Non-HDL Cholesterol (Calc): 208 mg/dL (calc) — ABNORMAL HIGH (ref <130)
Total CHOL/HDL Ratio: 8.2 (calc) — ABNORMAL HIGH (ref <5.0)
Triglycerides: 605 mg/dL — ABNORMAL HIGH (ref <150)

## 2019-08-30 LAB — COMPREHENSIVE METABOLIC PANEL
AG Ratio: 1.7 (calc) (ref 1.0–2.5)
ALT: 41 U/L (ref 9–46)
AST: 29 U/L (ref 10–35)
Albumin: 4 g/dL (ref 3.6–5.1)
Alkaline phosphatase (APISO): 96 U/L (ref 35–144)
BUN/Creatinine Ratio: 15 (calc) (ref 6–22)
BUN: 24 mg/dL (ref 7–25)
CO2: 31 mmol/L (ref 20–32)
Calcium: 9.5 mg/dL (ref 8.6–10.3)
Chloride: 109 mmol/L (ref 98–110)
Creat: 1.6 mg/dL — ABNORMAL HIGH (ref 0.70–1.25)
Globulin: 2.3 g/dL (calc) (ref 1.9–3.7)
Glucose, Bld: 139 mg/dL — ABNORMAL HIGH (ref 65–99)
Potassium: 4.4 mmol/L (ref 3.5–5.3)
Sodium: 144 mmol/L (ref 135–146)
Total Bilirubin: 0.4 mg/dL (ref 0.2–1.2)
Total Protein: 6.3 g/dL (ref 6.1–8.1)

## 2019-08-30 LAB — HEMOGLOBIN A1C
Hgb A1c MFr Bld: 6.4 % of total Hgb — ABNORMAL HIGH (ref <5.7)
Mean Plasma Glucose: 137 (calc)
eAG (mmol/L): 7.6 (calc)

## 2019-08-30 LAB — TSH: TSH: 7.33 mIU/L — ABNORMAL HIGH (ref 0.40–4.50)

## 2019-08-30 LAB — PSA: PSA: 1.8 ng/mL (ref <=4.0)

## 2019-08-31 NOTE — Progress Notes (Signed)
Please inform patient of the following:  Cholesterol numbers are elevated.  Recommend starting Lipitor 40 mg daily to improve numbers and lower risk of heart attack and stroke.  Blood sugar is borderline diabetic.  Recommend starting Metformin 750 mg daily to improve numbers and lower risk of heart attack and stroke.  We should recheck in about 3 months.  Regardless patient should continue working on diet and exercise and we can recheck in 3 months.  Thyroid numbers are off.  Would like for him to come back to recheck.  Please place future order for TSH, free T4, and free T3.  Blood counts dropped a bit and kidney numbers went up.  Would like for make sure he is getting plenty of fluid.  Would like for him to come back to recheck.  Please place future order for CMET, CBC, B12, ferritin, and iron panel.  [name]. [name], MD 08/31/2019 12:11 PM

## 2019-09-03 ENCOUNTER — Other Ambulatory Visit: Payer: Self-pay

## 2019-09-03 ENCOUNTER — Telehealth: Payer: Self-pay

## 2019-09-03 DIAGNOSIS — R7989 Other specified abnormal findings of blood chemistry: Secondary | ICD-10-CM

## 2019-09-03 DIAGNOSIS — N289 Disorder of kidney and ureter, unspecified: Secondary | ICD-10-CM

## 2019-09-03 NOTE — Telephone Encounter (Signed)
Appt schedule to go over labs and medications

## 2019-09-03 NOTE — Telephone Encounter (Signed)
Pt returning call. Please call him after 3 at his home number.

## 2019-09-09 ENCOUNTER — Ambulatory Visit (INDEPENDENT_AMBULATORY_CARE_PROVIDER_SITE_OTHER): Payer: PPO | Admitting: Family Medicine

## 2019-09-09 ENCOUNTER — Other Ambulatory Visit: Payer: Self-pay

## 2019-09-09 ENCOUNTER — Encounter: Payer: Self-pay | Admitting: Family Medicine

## 2019-09-09 VITALS — BP 149/68 | HR 73 | Temp 98.1°F | Ht 72.0 in | Wt 263.8 lb

## 2019-09-09 DIAGNOSIS — R739 Hyperglycemia, unspecified: Secondary | ICD-10-CM | POA: Diagnosis not present

## 2019-09-09 DIAGNOSIS — N289 Disorder of kidney and ureter, unspecified: Secondary | ICD-10-CM | POA: Diagnosis not present

## 2019-09-09 DIAGNOSIS — E039 Hypothyroidism, unspecified: Secondary | ICD-10-CM | POA: Diagnosis not present

## 2019-09-09 DIAGNOSIS — R7989 Other specified abnormal findings of blood chemistry: Secondary | ICD-10-CM

## 2019-09-09 DIAGNOSIS — E038 Other specified hypothyroidism: Secondary | ICD-10-CM

## 2019-09-09 DIAGNOSIS — D649 Anemia, unspecified: Secondary | ICD-10-CM

## 2019-09-09 MED ORDER — LEVOTHYROXINE SODIUM 50 MCG PO TABS
50.0000 ug | ORAL_TABLET | Freq: Every day | ORAL | 5 refills | Status: DC
Start: 2019-09-09 — End: 2019-11-07

## 2019-09-09 NOTE — Progress Notes (Signed)
   Dale Wood is a 69 y.o. male who presents today for an office visit.  Assessment/Plan:  New/Acute Problems: Normocytic anemia Hemoglobin 12.2 and MCV 84 on blood draw last week.  Will recheck CBC.  Also check iron panel and B12.  Elevated Creatinine  Likely due to dehydration.  Recheck CMET today.  Chronic Problems Addressed Today: Subclinical hypothyroidism TSH about 7.  Which recheck TSH, T4, and T3 today.  Has subclinical hypothyroidism based on last several labs.  Likely contributing to metabolic issues including hyperglycemia and dyslipidemia as well.  Discussed risks and benefits of starting thyroid replacement.  We will start Synthroid 50 mcg daily.  We will recheck in 3 months.  Discussed potential side effects.  Hyperglycemia Last A1c 6.4.  He wishes to work on diet and exercise before starting Metformin.  I think this is reasonable.  We will recheck A1c in 3 months.     Subjective:  HPI:  Patient here for follow-up.  Plan annual exam last week.  Not have several lab abnormalities including A1c 6.4, elevated TSH, elevated creatinine, and low hemoglobin.  Overall feels well.  He does admit to several dietary indiscretions over the last several months.  He has been trying to work more on diet and exercise over the last week or so.       Objective:  Physical Exam: BP (!) 149/68   Pulse 73   Temp 98.1 F (36.7 C) (Temporal)   Ht 6' (1.829 m)   Wt 263 lb 12.8 oz (119.7 kg)   SpO2 100%   BMI 35.78 kg/m   Wt Readings from Last 3 Encounters:  09/09/19 263 lb 12.8 oz (119.7 kg)  08/29/19 268 lb (121.6 kg)  08/28/19 268 lb 12.8 oz (121.9 kg)    Gen: No acute distress, resting comfortably CV: Regular rate and rhythm with no murmurs appreciated Pulm: Normal work of breathing, clear to auscultation bilaterally with no crackles, wheezes, or rhonchi Neuro: Grossly normal, moves all extremities Psych: Normal affect and thought content      Dale Wood M. Dale Pain,  MD 09/09/2019 3:57 PM

## 2019-09-09 NOTE — Assessment & Plan Note (Signed)
TSH about 7.  Which recheck TSH, T4, and T3 today.  Has subclinical hypothyroidism based on last several labs.  Likely contributing to metabolic issues including hyperglycemia and dyslipidemia as well.  Discussed risks and benefits of starting thyroid replacement.  We will start Synthroid 50 mcg daily.  We will recheck in 3 months.  Discussed potential side effects.

## 2019-09-09 NOTE — Assessment & Plan Note (Signed)
Last A1c 6.4.  He wishes to work on diet and exercise before starting Metformin.  I think this is reasonable.  We will recheck A1c in 3 months.

## 2019-09-09 NOTE — Patient Instructions (Signed)
It was very nice to see you today!  Please start the Synthroid.  Take 1 tablet daily.  Please continue working on diet and exercise.  I will see you back in 3 months.  We will check blood work today.  Take care, Dr [name]  Please try these tips to maintain a healthy lifestyle:   Eat at least 3 REAL meals and 1-2 snacks per day.  Aim for no more than 5 hours between eating.  If you eat breakfast, please do so within one hour of getting up.    Each meal should contain half fruits/vegetables, one quarter protein, and one quarter carbs (no bigger than a computer mouse)   Cut down on sweet beverages. This includes juice, soda, and sweet tea.     Drink at least 1 glass of water with each meal and aim for at least 8 glasses per day   Exercise at least 150 minutes every week.

## 2019-09-09 NOTE — Addendum Note (Signed)
Addended by: Liliane Channel on: 09/09/2019 03:55 PM   Modules accepted: Orders

## 2019-09-10 LAB — COMPREHENSIVE METABOLIC PANEL
AG Ratio: 1.7 (calc) (ref 1.0–2.5)
ALT: 27 U/L (ref 9–46)
AST: 20 U/L (ref 10–35)
Albumin: 4 g/dL (ref 3.6–5.1)
Alkaline phosphatase (APISO): 92 U/L (ref 35–144)
BUN/Creatinine Ratio: 11 (calc) (ref 6–22)
BUN: 16 mg/dL (ref 7–25)
CO2: 28 mmol/L (ref 20–32)
Calcium: 9 mg/dL (ref 8.6–10.3)
Chloride: 104 mmol/L (ref 98–110)
Creat: 1.4 mg/dL — ABNORMAL HIGH (ref 0.70–1.25)
Globulin: 2.4 g/dL (calc) (ref 1.9–3.7)
Glucose, Bld: 92 mg/dL (ref 65–99)
Potassium: 4.3 mmol/L (ref 3.5–5.3)
Sodium: 141 mmol/L (ref 135–146)
Total Bilirubin: 0.4 mg/dL (ref 0.2–1.2)
Total Protein: 6.4 g/dL (ref 6.1–8.1)

## 2019-09-10 LAB — CBC
HCT: 39.1 % (ref 38.5–50.0)
Hemoglobin: 12.7 g/dL — ABNORMAL LOW (ref 13.2–17.1)
MCH: 27 pg (ref 27.0–33.0)
MCHC: 32.5 g/dL (ref 32.0–36.0)
MCV: 83 fL (ref 80.0–100.0)
MPV: 9.8 fL (ref 7.5–12.5)
Platelets: 323 10*3/uL (ref 140–400)
RBC: 4.71 10*6/uL (ref 4.20–5.80)
RDW: 16.7 % — ABNORMAL HIGH (ref 11.0–15.0)
WBC: 7.2 10*3/uL (ref 3.8–10.8)

## 2019-09-10 LAB — TSH: TSH: 7.16 mIU/L — ABNORMAL HIGH (ref 0.40–4.50)

## 2019-09-10 LAB — VITAMIN B12: Vitamin B-12: 466 pg/mL (ref 200–1100)

## 2019-09-10 LAB — T4, FREE: Free T4: 1 ng/dL (ref 0.8–1.8)

## 2019-09-10 LAB — IRON,TIBC AND FERRITIN PANEL
%SAT: 5 % (calc) — ABNORMAL LOW (ref 20–48)
Ferritin: 4 ng/mL — ABNORMAL LOW (ref 24–380)
Iron: 19 ug/dL — ABNORMAL LOW (ref 50–180)
TIBC: 396 mcg/dL (calc) (ref 250–425)

## 2019-09-10 LAB — T3, FREE: T3, Free: 2.6 pg/mL (ref 2.3–4.2)

## 2019-09-11 NOTE — Progress Notes (Signed)
Please inform patient of the following:  His iron counts are low but everything else is stable. Recommend referral to GI to look for signs of blood loss. We will see him back here in 3 months.  Dale Wood. [name], MD 09/11/2019 8:02 AM

## 2019-09-13 ENCOUNTER — Telehealth: Payer: Self-pay | Admitting: Family Medicine

## 2019-09-13 ENCOUNTER — Encounter: Payer: Self-pay | Admitting: Family Medicine

## 2019-09-13 NOTE — Telephone Encounter (Signed)
Pt stated he donated blood and was told was tested positive for Hep B core antibody

## 2019-09-13 NOTE — Telephone Encounter (Signed)
Pt called stating he donated blood to [facility]. Pt states the blood center called him and told him they found something in his blood. Pt wants to speak with CMA or provider about results that were found. Please advise.

## 2019-09-13 NOTE — Telephone Encounter (Signed)
Please advise 

## 2019-09-14 ENCOUNTER — Other Ambulatory Visit: Payer: Self-pay | Admitting: Family Medicine

## 2019-09-16 ENCOUNTER — Other Ambulatory Visit: Payer: Self-pay | Admitting: Family Medicine

## 2019-09-16 DIAGNOSIS — R768 Other specified abnormal immunological findings in serum: Secondary | ICD-10-CM

## 2019-09-16 NOTE — Telephone Encounter (Signed)
Lvm for pt to call the office back. 

## 2019-09-16 NOTE — Telephone Encounter (Signed)
Please see mychart message. Pt needs to come back in for labs.  Dale Wood. [name], MD 09/16/2019 10:11 AM

## 2019-09-16 NOTE — Telephone Encounter (Signed)
LVM to schedule lab appointment   Future labs placed. Pt need lab appointment

## 2019-09-17 ENCOUNTER — Other Ambulatory Visit: Payer: Self-pay

## 2019-09-17 ENCOUNTER — Other Ambulatory Visit: Payer: PPO

## 2019-09-17 DIAGNOSIS — R768 Other specified abnormal immunological findings in serum: Secondary | ICD-10-CM | POA: Diagnosis not present

## 2019-09-18 LAB — HEPATITIS B SURFACE ANTIGEN: Hepatitis B Surface Ag: NONREACTIVE

## 2019-09-18 LAB — HEPATITIS B CORE ANTIBODY, TOTAL: Hep B Core Total Ab: NONREACTIVE

## 2019-09-18 LAB — HEPATITIS B SURFACE ANTIBODY,QUALITATIVE: Hep B S Ab: NONREACTIVE

## 2019-09-20 NOTE — Progress Notes (Signed)
Please inform patient of the following:  All of his hepatitis tests are negative. This means he does not have hepatitis and he has never been exposed to it. [facility] a [facility]. He is eligible for the hepatitis B vaccine if he is interested.  Dale Wood. [name], MD 09/20/2019 2:40 PM

## 2019-10-11 ENCOUNTER — Telehealth: Payer: Self-pay | Admitting: Family Medicine

## 2019-10-11 NOTE — Telephone Encounter (Signed)
Left message for patient to call back and schedule Medicare Annual Wellness Visit (AWV) either virtually/audio only OR in office. Whatever the patients preference is.  Last AWV 09/18/18; please schedule at anytime with [facility]Nurse Health Advisor at [facility].  This should be a 45 minute visit.

## 2019-10-22 ENCOUNTER — Telehealth: Payer: Self-pay

## 2019-10-22 NOTE — Progress Notes (Signed)
Chronic Care Management Pharmacy Assistant   Name: Dale Wood  MRN: [id number] DOB: [birth year]  Reason for Encounter: Medication Review/Inital Visit   Patient Questions:  1.  Have you seen any other providers since your last visit? No  2.  Any changes in your medicines or health? No   [place],  69 y.o. , male presents for their Initial CCM visit with the clinical pharmacist via telephone.  PCP : Dale Barrack, MD  Allergies:   Allergies  Allergen Reactions  . Androgel [Testosterone]     *Pains in Chest/Axillary area*  . Penicillins     *Does Not Work*    Medications: Outpatient Encounter Medications as of 10/22/2019  Medication Sig  . acetaminophen (TYLENOL) 325 MG tablet Take 650 mg by mouth every 6 (six) hours as needed.  . cyclobenzaprine (FLEXERIL) 10 MG tablet Take 1 tablet (10 mg total) by mouth at bedtime as needed for muscle spasms.  . diclofenac (VOLTAREN) 75 MG EC tablet TAKE 1 TABLET(75 MG) BY MOUTH TWICE DAILY  . gabapentin (NEURONTIN) 300 MG capsule Take 1 capsule (300 mg total) by mouth 3 (three) times daily as needed (nerve pain).  Marland Kitchen levothyroxine (SYNTHROID) 50 MCG tablet Take 1 tablet (50 mcg total) by mouth daily before breakfast.  . losartan (COZAAR) 100 MG tablet Take 1 tablet (100 mg total) by mouth daily.  . sildenafil (VIAGRA) 100 MG tablet Take 0.5-1 tablets (50-100 mg total) by mouth daily as needed for erectile dysfunction.   No facility-administered encounter medications on file as of 10/22/2019.    Current Diagnosis: Patient Active Problem List   Diagnosis Date Noted  . Subclinical hypothyroidism 09/09/2019  . Hyperglycemia 08/29/2019  . Low back pain with sciatica 07/09/2019  . Hyperlipidemia 01/05/2017  . Hypertension 01/05/2017  . ANKLE INJURY, RIGHT 09/22/2009  . ERECTILE DYSFUNCTION, ORGANIC 10/27/2008  . OSTEOARTHRITIS 10/27/2008  . ABDOMINAL PAIN, EPIGASTRIC 10/27/2008  . SKIN CANCER, HX OF 10/27/2008  .  NEPHROLITHIASIS, HX OF 10/27/2008  . BASAL CELL CARCINOMA, FACE 10/27/2008     Have you seen any other providers since your last visit? Patient stated he has not seen any other providers. Any changes in your medications or health? Patient states there is no changes in medications or health. Any side effects from any medications? Patient states there are no side effects  Do you have an symptoms or problems not managed by your medications? Patient states there is no problems at this moment.  Any concerns about your health right now? Patient states he has no concerns at this time. Has your provider asked that you check blood pressure, blood sugar, or follow special diet at home? Yes , Patient states he [facility] his bp [facility] the [facility] . Patient states his Diastolic Numbers are in the 56C but his systolic numbers are still mid 140s.  Do you get any type of exercise on a regular basis? Yes, Patient states he and his wife go to two different gyms. One for cardio and one for weights. Can you think of a goal you would like to reach for your health? Patient states he lost 20 pounds and would like to lose 20 more pounds.  Do you have any problems getting your medications? Patient states he has no problems getting medications. Is there anything that you would like to discuss during the appointment? Patient states not at this moment.   Please bring medications and supplements to appointment   Dale Wood ,[place]  Pharmacist Assistant [phone]    Follow-Up:  Pharmacist Review

## 2019-10-23 ENCOUNTER — Ambulatory Visit: Payer: PPO

## 2019-10-23 DIAGNOSIS — I10 Essential (primary) hypertension: Secondary | ICD-10-CM

## 2019-10-23 DIAGNOSIS — E785 Hyperlipidemia, unspecified: Secondary | ICD-10-CM

## 2019-10-23 NOTE — Patient Instructions (Addendum)
Please review care plan below and call me at [phone] (direct line) with any questions!  Thank you, Dale Wood., Clinical Pharmacist  Goals Addressed            This Visit's Progress   . PharmD Care Plan       CARE PLAN ENTRY (see longitudinal plan of care for additional care plan information)  Current Barriers:  . Chronic Disease Management support, education, and care coordination needs related to Hypertension, Hyperlipidemia, and Prediabetes   Hypertension BP Readings from Last 3 Encounters:  09/09/19 (!) 149/68  08/29/19 (!) 154/82  08/28/19 (!) 150/90   . Pharmacist Clinical Goal(s): o Over the next 180 days, patient will work with PharmD and providers to achieve BP goal <130/80 . Current regimen:  o Losartan 100 mg once daily . Interventions: o Discussed diet/exercised recommendations, possibly starting on additional agent such as hydrochlorothiazide  . Patient self care activities - Over the next 180 days, patient will: o Check BP once daily, document, and provide at future appointments o Ensure daily salt intake < 2300 mg/day  Hyperlipidemia Lab Results  Component Value Date/Time   [facility]  08/29/2019 03:39 PM     Comment:     . LDL cholesterol not calculated. Triglyceride levels greater than 400 mg/dL invalidate calculated LDL results. . Reference range: <100 . Desirable range <100 mg/dL for primary prevention;   <70 mg/dL for patients with CHD or diabetic patients  with > or = 2 CHD risk factors. Marland Kitchen LDL-C is now calculated using the Martin-Hopkins  calculation, which is a validated novel method providing  better accuracy than the Friedewald equation in the  estimation of LDL-C.  Cresenciano Genre et al. Annamaria Helling. 2831;517(61): 2061-2068  (http://education.[employer].com/faq/FAQ164)    LDLDIRECT 116.0 11/22/2016 04:48 PM   . Pharmacist Clinical Goal(s): o Over the next 180 days, patient will work with PharmD and providers to achieve LDL goal <  70 . Current regimen:  o N/a . Interventions: o Diet/exercise recommendations . Patient self care activities - Over the next 180 days, patient will: o Continue current management  Diabetes Lab Results  Component Value Date/Time   HGBA1C 6.4 (H) 08/29/2019 03:39 PM   . Pharmacist Clinical Goal(s): o Over the next 180 days, patient will work with PharmD and providers to maintain A1c goal <6.5% . Current regimen:  o N/a . Interventions: o Discussed diet/exercise recommendations . Patient self care activities - Over the next 180 days, patient will: o Check blood sugar if directed, document, and provide at future appointments o Contact provider with any episodes of hypoglycemia  Medication management . Pharmacist Clinical Goal(s): o Over the next 180 days, patient will work with PharmD and providers to maintain optimal medication adherence . [facility] . Interventions o Comprehensive medication review performed. o Continue current medication management strategy . Patient self care activities - Over the next 180 days, patient will: o Take medications as prescribed o Report any questions or concerns to PharmD and/or provider(s) Initial goal documentation.      [name] was given information about Chronic Care Management services today including:  1. CCM service includes personalized support from designated clinical staff supervised by his physician, including individualized plan of care and coordination with other care providers 2. 24/7 contact phone numbers for assistance for urgent and routine care needs. 3. Standard insurance, coinsurance, copays and deductibles apply for chronic care management only during months in which we provide at least 20 minutes of these services.  Most insurances cover these services at 100%, however patients may be responsible for any copay, coinsurance and/or deductible if applicable. This service may help you avoid the need for more  expensive face-to-face services. 4. Only one practitioner may furnish and bill the service in a calendar month. 5. The patient may stop CCM services at any time (effective at the end of the month) by phone call to the office staff.  Patient agreed to services and verbal consent obtained.   The patient verbalized understanding of instructions provided today and agreed to receive a mailed copy of patient instruction and/or educational materials. Telephone follow up appointment with pharmacy team member scheduled for: See next appointment with "Care Management Staff" under "What's Next" below.   Madelin Rear, Pharm.D., BCGP Clinical Pharmacist [facility] [phone]  Diabetes Mellitus and Nutrition, Adult When you have diabetes (diabetes mellitus), it is very important to have healthy eating habits because your blood sugar (glucose) levels are greatly affected by what you eat and drink. Eating healthy foods in the appropriate amounts, at about the same times every day, can help you:  Control your blood glucose.  Lower your risk of heart disease.  Improve your blood pressure.  Reach or maintain a healthy weight. Every person with diabetes is different, and each person has different needs for a meal plan. Your health care provider may recommend that you work with a diet and nutrition specialist (dietitian) to make a meal plan that is best for you. Your meal plan may vary depending on factors such as:  The calories you need.  The medicines you take.  Your weight.  Your blood glucose, blood pressure, and cholesterol levels.  Your activity level.  Other health conditions you have, such as heart or kidney disease. How do carbohydrates affect me? Carbohydrates, also called carbs, affect your blood glucose level more than any other type of food. Eating carbs naturally raises the amount of glucose in your blood. Carb counting is a method for keeping track of how many carbs you  eat. Counting carbs is important to keep your blood glucose at a healthy level, especially if you use insulin or take certain oral diabetes medicines. It is important to know how many carbs you can safely have in each meal. This is different for every person. Your dietitian can help you calculate how many carbs you should have at each meal and for each snack. Foods that contain carbs include:  Bread, cereal, rice, pasta, and crackers.  Potatoes and corn.  Peas, beans, and lentils.  Milk and yogurt.  Fruit and juice.  Desserts, such as cakes, cookies, ice cream, and candy. How does alcohol affect me? Alcohol can cause a sudden decrease in blood glucose (hypoglycemia), especially if you use insulin or take certain oral diabetes medicines. Hypoglycemia can be a life-threatening condition. Symptoms of hypoglycemia (sleepiness, dizziness, and confusion) are similar to symptoms of having too much alcohol. If your health care provider says that alcohol is safe for you, follow these guidelines:  Limit alcohol intake to no more than 1 drink per day for nonpregnant women and 2 drinks per day for men. One drink equals 12 oz of beer, 5 oz of wine, or 1 oz of hard liquor.  Do not drink on an empty stomach.  Keep yourself hydrated with water, diet soda, or unsweetened iced tea.  Keep in mind that regular soda, juice, and other mixers may contain a lot of sugar and must be counted as carbs.  What are tips for following this plan?  Reading food labels  Start by checking the serving size on the "Nutrition Facts" label of packaged foods and drinks. The amount of calories, carbs, fats, and other nutrients listed on the label is based on one serving of the item. Many items contain more than one serving per package.  Check the total grams (g) of carbs in one serving. You can calculate the number of servings of carbs in one serving by dividing the total carbs by 15. For example, if a food has 30 g of total  carbs, it would be equal to 2 servings of carbs.  Check the number of grams (g) of saturated and trans fats in one serving. Choose foods that have low or no amount of these fats.  Check the number of milligrams (mg) of salt (sodium) in one serving. Most people should limit total sodium intake to less than 2,300 mg per day.  Always check the nutrition information of foods labeled as "low-fat" or "nonfat". These foods may be higher in added sugar or refined carbs and should be avoided.  Talk to your dietitian to identify your daily goals for nutrients listed on the label. Shopping  Avoid buying canned, premade, or processed foods. These foods tend to be high in fat, sodium, and added sugar.  Shop around the outside edge of the grocery store. This includes fresh fruits and vegetables, bulk grains, fresh meats, and fresh dairy. Cooking  Use low-heat cooking methods, such as baking, instead of high-heat cooking methods like deep frying.  Cook using healthy oils, such as olive, canola, or sunflower oil.  Avoid cooking with butter, cream, or high-fat meats. Meal planning  Eat meals and snacks regularly, preferably at the same times every day. Avoid going long periods of time without eating.  Eat foods high in fiber, such as fresh fruits, vegetables, beans, and whole grains. Talk to your dietitian about how many servings of carbs you can eat at each meal.  Eat 4-6 ounces (oz) of lean protein each day, such as lean meat, chicken, fish, eggs, or tofu. One oz of lean protein is equal to: ? 1 oz of meat, chicken, or fish. ? 1 egg. ?  cup of tofu.  Eat some foods each day that contain healthy fats, such as avocado, nuts, seeds, and fish. Lifestyle  Check your blood glucose regularly.  Exercise regularly as told by your health care provider. This may include: ? 150 minutes of moderate-intensity or vigorous-intensity exercise each week. This could be brisk walking, biking, or water  aerobics. ? Stretching and doing strength exercises, such as yoga or weightlifting, at least 2 times a week.  Take medicines as told by your health care provider.  Do not use any products that contain nicotine or tobacco, such as cigarettes and e-cigarettes. If you need help quitting, ask your health care provider.  Work with a Social worker or diabetes educator to identify strategies to manage stress and any emotional and social challenges. Questions to ask a health care provider  Do I need to meet with a diabetes educator?  Do I need to meet with a dietitian?  What number can I call if I have questions?  When are the best times to check my blood glucose? Where to find more information:  American Diabetes Association: diabetes.org  Academy of Nutrition and Dietetics: www.eatright.[employer] of Diabetes and Digestive and Kidney Diseases (NIH): [employer] Summary  A healthy meal plan will help you  control your blood glucose and maintain a healthy lifestyle.  Working with a diet and nutrition specialist (dietitian) can help you make a meal plan that is best for you.  Keep in mind that carbohydrates (carbs) and alcohol have immediate effects on your blood glucose levels. It is important to count carbs and to use alcohol carefully. This information is not intended to replace advice given to you by your health care provider. Make sure you discuss any questions you have with your health care provider. Document Revised: 12/16/2016 Document Reviewed: 02/08/2016 Elsevier Patient Education  2020 [employer].

## 2019-10-23 NOTE — Progress Notes (Addendum)
Chronic Care Management Pharmacy  Name: Dale Wood  MRN: [id number] DOB: 06-Apr-1950  Chief Complaint/ HPI  Dale Wood,  69 y.o., male presents for their Initial CCM visit with the clinical pharmacist via telephone due to COVID-19 Pandemic  PCP : Dale Barrack, MD  Chronic conditions include:  Encounter Diagnoses  Name Primary?  . Hyperlipidemia, unspecified hyperlipidemia type Yes  . Hypertension, unspecified type     Office Visits:  09/09/2019 (PCP): iron counts low, referred to GI.   Patient Active Problem List   Diagnosis Date Noted  . Subclinical hypothyroidism 09/09/2019  . Hyperglycemia 08/29/2019  . Low back pain with sciatica 07/09/2019  . Hyperlipidemia 01/05/2017  . Hypertension 01/05/2017  . ANKLE INJURY, RIGHT 09/22/2009  . ERECTILE DYSFUNCTION, ORGANIC 10/27/2008  . OSTEOARTHRITIS 10/27/2008  . ABDOMINAL PAIN, EPIGASTRIC 10/27/2008  . SKIN CANCER, HX OF 10/27/2008  . NEPHROLITHIASIS, HX OF 10/27/2008  . BASAL CELL CARCINOMA, FACE 10/27/2008   Past Surgical History:  Procedure Laterality Date  . KNEE SURGERY Right    2005, 2008  . TONSILLECTOMY AND ADENOIDECTOMY     Family History  Problem Relation Age of Onset  . Cancer Mother   . Diabetes Mother   . Cancer Father   . Early death Father   . Diabetes Brother   . Cancer Brother        prostate  . Drug abuse Brother   . Birth defects Sister   . Diabetes Maternal Grandmother   . Thyroid disease Maternal Grandmother   . Stroke Maternal Grandmother   . Tremor Maternal Grandfather   . Obesity Son    Allergies  Allergen Reactions  . Androgel [Testosterone]     *Pains in Chest/Axillary area*  . Penicillins     *Does Not Work*   Outpatient Encounter Medications as of 10/23/2019  Medication Sig  . acetaminophen (TYLENOL) 325 MG tablet Take 650 mg by mouth every 6 (six) hours as needed.  . cyclobenzaprine (FLEXERIL) 10 MG tablet Take 1 tablet (10 mg total) by mouth at bedtime as  needed for muscle spasms. (Patient not taking: Reported on 10/23/2019)  . diclofenac (VOLTAREN) 75 MG EC tablet TAKE 1 TABLET(75 MG) BY MOUTH TWICE DAILY (Patient not taking: Reported on 10/23/2019)  . gabapentin (NEURONTIN) 300 MG capsule Take 1 capsule (300 mg total) by mouth 3 (three) times daily as needed (nerve pain). (Patient not taking: Reported on 10/23/2019)  . levothyroxine (SYNTHROID) 50 MCG tablet Take 1 tablet (50 mcg total) by mouth daily before breakfast.  . losartan (COZAAR) 100 MG tablet Take 1 tablet (100 mg total) by mouth daily.  . sildenafil (VIAGRA) 100 MG tablet Take 0.5-1 tablets (50-100 mg total) by mouth daily as needed for erectile dysfunction.   No facility-administered encounter medications on file as of 10/23/2019.   Patient Care Team    Relationship Specialty Notifications Start End  Dale Barrack, MD PCP - General Family Medicine  11/22/16   Josue Hector, MD Consulting Physician Cardiology  09/18/18   [place] Physician Optometry  09/18/18   Madelin Rear, [facility] Pharmacist Pharmacist  08/27/19    Comment: [phone]   Current Diagnosis/Assessment: Goals Addressed            This Visit's Progress   . PharmD Care Plan       CARE PLAN ENTRY (see longitudinal plan of care for additional care plan information)  Current Barriers:  . Chronic Disease Management support, education, and  care coordination needs related to Hypertension, Hyperlipidemia, and Prediabetes   Hypertension BP Readings from Last 3 Encounters:  09/09/19 (!) 149/68  08/29/19 (!) 154/82  08/28/19 (!) 150/90   . Pharmacist Clinical Goal(s): o Over the next 180 days, patient will work with PharmD and providers to achieve BP goal <130/80 . Current regimen:  o Losartan 100 mg once daily . Interventions: o Discussed diet/exercised recommendations, possibly starting on additional agent such as hydrochlorothiazide  . Patient self care activities - Over the next 180 days, patient  will: o Check BP once daily, document, and provide at future appointments o Ensure daily salt intake < 1800 mg/day  Hyperlipidemia Lab Results  Component Value Date/Time   [facility]  08/29/2019 03:39 PM     Comment:     . LDL cholesterol not calculated. Triglyceride levels greater than 400 mg/dL invalidate calculated LDL results. . Reference range: <100 . Desirable range <100 mg/dL for primary prevention;   <70 mg/dL for patients with CHD or diabetic patients  with > or = 2 CHD risk factors. Marland Kitchen LDL-C is now calculated using the Martin-Hopkins  calculation, which is a validated novel method providing  better accuracy than the Friedewald equation in the  estimation of LDL-C.  Cresenciano Genre et al. Annamaria Helling. 1950;932(67): 2061-2068  (http://education.[facility].com/faq/FAQ164)    LDLDIRECT 116.0 11/22/2016 04:48 PM   . Pharmacist Clinical Goal(s): o Over the next 180 days, patient will work with PharmD and providers to achieve LDL goal < 70 . Current regimen:  o N/a . Interventions: o Diet/exercise recommendations . Patient self care activities - Over the next 180 days, patient will: o Continue current management  Prediabetes Lab Results  Component Value Date/Time   HGBA1C 6.4 (H) 08/29/2019 03:39 PM   . Pharmacist Clinical Goal(s): o Over the next 180 days, patient will work with PharmD and providers to maintain A1c goal <6.5% . Current regimen:  o N/a . Interventions: o Discussed diet/exercise recommendations . Patient self care activities - Over the next 180 days, patient will: o Check blood sugar if directed, document, and provide at future appointments o Contact provider with any episodes of hypoglycemia  Medication management . Pharmacist Clinical Goal(s): o Over the next 180 days, patient will work with PharmD and providers to maintain optimal medication adherence . [facility] . Interventions o Comprehensive medication review  performed. o Continue current medication management strategy . Patient self care activities - Over the next 180 days, patient will: o Take medications as prescribed o Report any questions or concerns to PharmD and/or provider(s) Initial goal documentation.      Hypertension   BP goal <130/80    BP Readings from Last 3 Encounters:  09/09/19 (!) 149/68  08/29/19 (!) 154/82  08/28/19 (!) 150/90   BMP Latest Ref Rng & Units 09/09/2019 08/29/2019 05/30/2018  Glucose 65 - 99 mg/dL 92 139(H) 79  BUN 7 - 25 mg/dL 16 24 19   Creatinine 0.70 - 1.25 mg/dL 1.40(H) 1.60(H) 1.20  BUN/Creat Ratio 6 - 22 (calc) 11 15 16   Sodium 135 - 146 mmol/L 141 144 143  Potassium 3.5 - 5.3 mmol/L 4.3 4.4 4.1  Chloride 98 - 110 mmol/L 104 109 107(H)  CO2 20 - 32 mmol/L 28 31 24   Calcium 8.6 - 10.3 mg/dL 9.0 9.5 9.4   Patient has failed these meds in the past: amlodipine 10 mg once daily. Patient checks BP at home daily. Home/out of office BPs mid 140s/70s-180s and upper 140s/80s at  [facility].  Patient is currently not at goal on the following medications:  . Losartan 100 mg once daily  We discussed diet and exercise extensively. Has been very active with strength training and cardio at gym. Less frequent snacks now. Discussed BP monitoring and reviewed proper technique. Discussed possibility of additional BP agents, would be willing to start chlorthalidone or hydrochlorothiazide if PCP feels appropriate.   Plan  Continue current medications.  Consider addition of chlorthalidone 12.5 mg once daily.   Pre-diabetes   A1c goal < 6.5%  Lab Results  Component Value Date/Time   HGBA1C 6.4 (H) 08/29/2019 03:39 PM    Checking BG: n/a. Recent FBG readings n/a. 08/2019 had agreed to recheck DM in three months prior to starting on metformin.  Patient is currently controlled on the following medications:  . n/a  We discussed: diet and exercise extensively. Counseled on metformin, including stomach upset as  potential side effect and understands he would need to take with food. Would be ok with starting metformin 750 mg once daily.  Plan  Continue current medications. Review metformin initiation with PCP.   Hyperlipidemia   LDL goal < 70     Component Value Date/Time   CHOL 237 (H) 08/29/2019 1539   TRIG 605 (H) 08/29/2019 1539   HDL 29 (L) 08/29/2019 1539   LDLCALC  08/29/2019 1539     Comment:     . LDL cholesterol not calculated. Triglyceride levels greater than 400 mg/dL invalidate calculated LDL results. . Reference range: <100 . Desirable range <100 mg/dL for primary prevention;   <70 mg/dL for patients with CHD or diabetic patients  with > or = 2 CHD risk factors. Marland Kitchen LDL-C is now calculated using the Martin-Hopkins  calculation, which is a validated novel method providing  better accuracy than the Friedewald equation in the  estimation of LDL-C.  Cresenciano Genre et al. Annamaria Helling. 6283;662(94): 2061-2068  (http://education.[facility].com/faq/FAQ164)    LDLDIRECT 116.0 11/22/2016 1648    Hepatic Function Latest Ref Rng & Units 09/09/2019 08/29/2019 03/21/2018  Total Protein 6.1 - 8.1 g/dL 6.4 6.3 7.0  Albumin 3.5 - 5.0 g/dL - - 3.8  AST 10 - 35 U/L 20 29 22   ALT 9 - 46 U/L 27 41 25  Alk Phosphatase 38 - 126 U/L - - 98  Total Bilirubin 0.2 - 1.2 mg/dL 0.4 0.4 0.8  Bilirubin, Direct 0.0 - 0.3 mg/dL - - -    The 10-year ASCVD risk score Mikey Bussing DC Jr., et al., 2013) is: 33.8%   Values used to calculate the score:     Age: 55 years     Sex: Male     Is Non-Hispanic African American: No     Diabetic: No     Tobacco smoker: No     Systolic Blood Pressure: 765 mmHg     Is BP treated: Yes     HDL Cholesterol: 29 mg/dL     Total Cholesterol: 237 mg/dL   Synthroid started 09/09/2019. 10-year ASCVD 33.8%. if pt was "diabetic" ASCVD calc is 54.9%. Patient has failed these meds in past: n/a.  We discussed:  diet and exercise extensively. Counseled on ASCVD risk factors and statin  use. Patient is willing to start atorvastatin 40 mg once daily.  Plan  Continue current medications. Review statin initiation with PCP.  Hypothyroidism   Lab Results  Component Value Date/Time   TSH 7.16 (H) 09/09/2019 03:55 PM   TSH 7.33 (H) 08/29/2019 03:39 PM   FREET4 1.0  09/09/2019 04:00 PM   FREET4 0.84 08/25/2016 12:01 PM   Patient has failed these meds in past: n/a. Started on levothyroxine 09/09/2019. Patient is currently on the following medications:  . Levothyroxine 50 mcg   We discussed:  Counseled on levothyroxine use, consistently taking first thing in AM.  Plan  Continue current medications.  Vaccines   Immunization History  Administered Date(s) Administered  . Fluad Quad(high Dose 65+) 09/18/2018  . Influenza, High Dose Seasonal PF 11/22/2016  . Influenza-Unspecified 09/17/2016  . Pneumococcal Conjugate-13 11/22/2016  . Pneumococcal Polysaccharide-23 01/15/2019  . Td 01/17/2001  . Tdap 08/25/2010   Reviewed and discussed patient's vaccination history. Received one of the mrna covid vaccines on March 13, April 3rd (pfizer).  Plan  Recommended patient receive annual flu, covid vaccine booster, and shingrix vaccines.  Medication Management Coordination   Receives prescription medications from:  [facility], [place] [address] [place] [address] Phone: [phone] Fax: [phone]  Denies any issues with current medication managemetn.  Plan  Continue current medication management strategy. ___________________________ SDOH (Social Determinants of Health) assessments performed: Yes.  Future Appointments  Date Time Provider [place]  11/07/2019  3:15 PM [facility] HEALTH COACH [facility]  12/10/2019  3:00 PM Dale Barrack, MD [facility]  12/25/2019  1:00 PM [facility] CCM PHARMACIST [facility]   Visit follow-up:  . [facility] follow-up: 2 month  telephone visit, potential HLD/preDM/HTN med changes.  Madelin Rear, Pharm.D., BCGP Clinical Pharmacist [facility] [phone]

## 2019-10-30 ENCOUNTER — Other Ambulatory Visit: Payer: Self-pay | Admitting: *Deleted

## 2019-10-30 DIAGNOSIS — I10 Essential (primary) hypertension: Secondary | ICD-10-CM

## 2019-10-30 DIAGNOSIS — E785 Hyperlipidemia, unspecified: Secondary | ICD-10-CM

## 2019-10-31 ENCOUNTER — Ambulatory Visit: Payer: PPO

## 2019-10-31 DIAGNOSIS — G8929 Other chronic pain: Secondary | ICD-10-CM | POA: Diagnosis not present

## 2019-10-31 DIAGNOSIS — M25561 Pain in right knee: Secondary | ICD-10-CM | POA: Diagnosis not present

## 2019-11-01 DIAGNOSIS — G8929 Other chronic pain: Secondary | ICD-10-CM | POA: Diagnosis not present

## 2019-11-01 DIAGNOSIS — M25561 Pain in right knee: Secondary | ICD-10-CM | POA: Diagnosis not present

## 2019-11-07 ENCOUNTER — Ambulatory Visit (INDEPENDENT_AMBULATORY_CARE_PROVIDER_SITE_OTHER): Payer: PPO

## 2019-11-07 DIAGNOSIS — Z0000 Encounter for general adult medical examination without abnormal findings: Secondary | ICD-10-CM | POA: Diagnosis not present

## 2019-11-07 NOTE — Patient Instructions (Addendum)
[name] , Thank you for taking time to come for your Medicare Wellness Visit. I appreciate your ongoing commitment to your health goals. Please review the following plan we discussed and let me know if I can assist you in the future.   Screening recommendations/referrals: Colonoscopy: Done 10/12/10 Recommended yearly ophthalmology/optometry visit for glaucoma screening and checkup Recommended yearly dental visit for hygiene and checkup  Vaccinations: Influenza vaccine: Postponed until 11/29/19 wants to get at next appt  Pneumococcal vaccine: Up to date Tdap vaccine: Up to date Shingles vaccine: Shingrix 1st dose 10/25/19 per pt    Covid-19: Completed   Advanced directives: Advance directive discussed with you today. I have provided a copy for you to complete at home and have notarized. Once this is complete please bring a copy in to our office so we can scan it into your chart.   Conditions/risks identified: Lose 20lbs   Next appointment: Follow up in one year for your annual wellness visit.   Preventive Care 16 Years and Older, Male Preventive care refers to lifestyle choices and visits with your health care provider that can promote health and wellness. What does preventive care include?  A yearly physical exam. This is also called an annual well check.  Dental exams once or twice a year.  Routine eye exams. Ask your health care provider how often you should have your eyes checked.  Personal lifestyle choices, including:  Daily care of your teeth and gums.  Regular physical activity.  Eating a healthy diet.  Avoiding tobacco and drug use.  Limiting alcohol use.  Practicing safe sex.  Taking low doses of aspirin every day.  Taking vitamin and mineral supplements as recommended by your health care provider. What happens during an annual well check? The services and screenings done by your health care provider during your annual well check will depend on your age,  overall health, lifestyle risk factors, and family history of disease. Counseling  Your health care provider may ask you questions about your:  Alcohol use.  Tobacco use.  Drug use.  Emotional well-being.  Home and relationship well-being.  Sexual activity.  Eating habits.  History of falls.  Memory and ability to understand (cognition).  Work and work Statistician. Screening  You may have the following tests or measurements:  Height, weight, and BMI.  Blood pressure.  Lipid and cholesterol levels. These may be checked every 5 years, or more frequently if you are over 59 years old.  Skin check.  Lung cancer screening. You may have this screening every year starting at age 2 if you have a 30-pack-year history of smoking and currently smoke or have quit within the past 15 years.  Fecal occult blood test (FOBT) of the stool. You may have this test every year starting at age 58.  Flexible sigmoidoscopy or colonoscopy. You may have a sigmoidoscopy every 5 years or a colonoscopy every 10 years starting at age 104.  Prostate cancer screening. Recommendations will vary depending on your family history and other risks.  Hepatitis C blood test.  Hepatitis B blood test.  Sexually transmitted disease (STD) testing.  Diabetes screening. This is done by checking your blood sugar (glucose) after you have not eaten for a while (fasting). You may have this done every 1-3 years.  Abdominal aortic aneurysm (AAA) screening. You may need this if you are a current or former smoker.  Osteoporosis. You may be screened starting at age 56 if you are at high risk. Talk  with your health care provider about your test results, treatment options, and if necessary, the need for more tests. Vaccines  Your health care provider may recommend certain vaccines, such as:  Influenza vaccine. This is recommended every year.  Tetanus, diphtheria, and acellular pertussis (Tdap, Td) vaccine. You may  need a Td booster every 10 years.  Zoster vaccine. You may need this after age 14.  Pneumococcal 13-valent conjugate (PCV13) vaccine. One dose is recommended after age 27.  Pneumococcal polysaccharide (PPSV23) vaccine. One dose is recommended after age 64. Talk to your health care provider about which screenings and vaccines you need and how often you need them. This information is not intended to replace advice given to you by your health care provider. Make sure you discuss any questions you have with your health care provider. Document Released: 01/30/2015 Document Revised: 09/23/2015 Document Reviewed: 11/04/2014 Elsevier Interactive Patient Education  2017 [place] Prevention in the Home Falls can cause injuries. They can happen to people of all ages. There are many things you can do to make your home safe and to help prevent falls. What can I do on the outside of my home?  Regularly fix the edges of walkways and driveways and fix any cracks.  Remove anything that might make you trip as you walk through a door, such as a raised step or threshold.  Trim any bushes or trees on the path to your home.  Use bright outdoor lighting.  Clear any walking paths of anything that might make someone trip, such as rocks or tools.  Regularly check to see if handrails are loose or broken. Make sure that both sides of any steps have handrails.  Any raised decks and porches should have guardrails on the edges.  Have any leaves, snow, or ice cleared regularly.  Use sand or salt on walking paths during winter.  Clean up any spills in your garage right away. This includes oil or grease spills. What can I do in the bathroom?  Use night lights.  Install grab bars by the toilet and in the tub and shower. Do not use towel bars as grab bars.  Use non-skid mats or decals in the tub or shower.  If you need to sit down in the shower, use a plastic, non-slip stool.  Keep the floor  dry. Clean up any water that spills on the floor as soon as it happens.  Remove soap buildup in the tub or shower regularly.  Attach bath mats securely with double-sided non-slip rug tape.  Do not have throw rugs and other things on the floor that can make you trip. What can I do in the bedroom?  Use night lights.  Make sure that you have a light by your bed that is easy to reach.  Do not use any sheets or blankets that are too big for your bed. They should not hang down onto the floor.  Have a firm chair that has side arms. You can use this for support while you get dressed.  Do not have throw rugs and other things on the floor that can make you trip. What can I do in the kitchen?  Clean up any spills right away.  Avoid walking on wet floors.  Keep items that you use a lot in easy-to-reach places.  If you need to reach something above you, use a strong step stool that has a grab bar.  Keep electrical cords out of the way.  Do  not use floor polish or wax that makes floors slippery. If you must use wax, use non-skid floor wax.  Do not have throw rugs and other things on the floor that can make you trip. What can I do with my stairs?  Do not leave any items on the stairs.  Make sure that there are handrails on both sides of the stairs and use them. Fix handrails that are broken or loose. Make sure that handrails are as long as the stairways.  Check any carpeting to make sure that it is firmly attached to the stairs. Fix any carpet that is loose or worn.  Avoid having throw rugs at the top or bottom of the stairs. If you do have throw rugs, attach them to the floor with carpet tape.  Make sure that you have a light switch at the top of the stairs and the bottom of the stairs. If you do not have them, ask someone to add them for you. What else can I do to help prevent falls?  Wear shoes that:  Do not have high heels.  Have rubber bottoms.  Are comfortable and fit you  well.  Are closed at the toe. Do not wear sandals.  If you use a stepladder:  Make sure that it is fully opened. Do not climb a closed stepladder.  Make sure that both sides of the stepladder are locked into place.  Ask someone to hold it for you, if possible.  Clearly mark and make sure that you can see:  Any grab bars or handrails.  First and last steps.  Where the edge of each step is.  Use tools that help you move around (mobility aids) if they are needed. These include:  Canes.  Walkers.  Scooters.  Crutches.  Turn on the lights when you go into a dark area. Replace any light bulbs as soon as they burn out.  Set up your furniture so you have a clear path. Avoid moving your furniture around.  If any of your floors are uneven, fix them.  If there are any pets around you, be aware of where they are.  Review your medicines with your doctor. Some medicines can make you feel dizzy. This can increase your chance of falling. Ask your doctor what other things that you can do to help prevent falls. This information is not intended to replace advice given to you by your health care provider. Make sure you discuss any questions you have with your health care provider. Document Released: 10/30/2008 Document Revised: 06/11/2015 Document Reviewed: 02/07/2014 Elsevier Interactive Patient Education  2017 [employer].

## 2019-11-07 NOTE — Progress Notes (Addendum)
Virtual Visit via Telephone Note  I connected with  Dale Wood on 11/07/19 at  3:15 PM EDT by telephone and verified that I am speaking with the correct person using two identifiers.  Medicare Annual Wellness visit completed telephonically due to Covid-19 pandemic.   Persons participating in this call: This Health Coach and this patient.   Location: Patient: Home Provider: Office   I discussed the limitations, risks, security and privacy concerns of performing an evaluation and management service by telephone and the availability of in person appointments. The patient expressed understanding and agreed to proceed.  Unable to perform video visit due to video visit attempted and failed and/or patient does not have video capability.   Some vital signs may be absent or patient reported.   Willette Brace, LPN    Subjective:   Dale Wood is a 69 y.o. male who presents for Medicare Annual/Subsequent preventive examination.  Review of Systems     Cardiac Risk Factors include: dyslipidemia;hypertension;male gender;obesity (BMI >30kg/m2)     Objective:    There were no vitals filed for this visit. There is no height or weight on file to calculate BMI.  Advanced Directives 11/07/2019 07/26/2019 09/18/2018 03/22/2018 09/08/2017 06/18/2015  Does Patient Have a Medical Advance Directive? Yes No Yes No No No  Type of Advance Directive - - Living will;Healthcare Power of Attorney - - -  Does patient want to make changes to medical advance directive? Yes (MAU/Ambulatory/Procedural Areas - Information given) - No - Patient declined - - -  Copy of [place] in Chart? - - No - copy requested - - -  Would patient like information on creating a medical advance directive? - No - Patient declined - No - Patient declined No - Patient declined No - patient declined information    Current Medications (verified) Outpatient Encounter Medications as of 11/07/2019  Medication  Sig  . acetaminophen (TYLENOL) 325 MG tablet Take 650 mg by mouth every 6 (six) hours as needed.  . diclofenac (VOLTAREN) 75 MG EC tablet TAKE 1 TABLET(75 MG) BY MOUTH TWICE DAILY  . losartan (COZAAR) 100 MG tablet Take 1 tablet (100 mg total) by mouth daily.  . sildenafil (VIAGRA) 100 MG tablet Take 0.5-1 tablets (50-100 mg total) by mouth daily as needed for erectile dysfunction.  . [DISCONTINUED] cyclobenzaprine (FLEXERIL) 10 MG tablet Take 1 tablet (10 mg total) by mouth at bedtime as needed for muscle spasms. (Patient not taking: Reported on 10/23/2019)  . [DISCONTINUED] gabapentin (NEURONTIN) 300 MG capsule Take 1 capsule (300 mg total) by mouth 3 (three) times daily as needed (nerve pain). (Patient not taking: Reported on 10/23/2019)  . [DISCONTINUED] levothyroxine (SYNTHROID) 50 MCG tablet Take 1 tablet (50 mcg total) by mouth daily before breakfast. (Patient not taking: Reported on 11/07/2019)   No facility-administered encounter medications on file as of 11/07/2019.    Allergies (verified) Androgel [testosterone] and Penicillins   History: Past Medical History:  Diagnosis Date  . Abdominal pain, epigastric 10/27/2008   Qualifier: Diagnosis of  By: Ronnald Ramp MD, [name].   Marland Kitchen ANKLE INJURY, RIGHT 09/22/2009   Qualifier: Diagnosis of  By: Nathaneil Canary, CMA, [name]    . BASAL CELL CARCINOMA, FACE 10/27/2008   Qualifier: Diagnosis of  By: Ronnald Ramp MD, [name].   Marland Kitchen BASAL CELL CARCINOMA, FACE 10/27/2008   Qualifier: Diagnosis of By: Ronnald Ramp MD, [name].   . Cancer ([place])    Skin Cancer on Nose  . CELLULITIS,  FOOT 09/22/2009   Qualifier: Diagnosis of  By: Ronnald Ramp MD, [name].   . Constipation 01/05/2017  . DYSMETABOLIC SYNDROME 1/61/0960   Qualifier: Diagnosis of  By: Ronnald Ramp MD, [place] 10/27/2008   Qualifier: Diagnosis of  By: Ronnald Ramp MD, [name].   . ERECTILE DYSFUNCTION, ORGANIC 10/27/2008   Qualifier: Diagnosis of  By: Ronnald Ramp MD, [name].   . Headache 07/30/2015  . Heart murmur     . Hyperlipidemia   . HYPERLIPIDEMIA 10/27/2008   Qualifier: Diagnosis of  By: Ronnald Ramp MD, [name].   . Hypertension   . HYPOGONADISM 11/26/2008   Qualifier: Diagnosis of  By: Ronnald Ramp MD, [name].   . Kidney stones   . Low testosterone   . NEPHROLITHIASIS, HX OF 10/27/2008   Qualifier: Diagnosis of  By: Ronnald Ramp MD, [name].   . Nephrolithiasis, uric acid   . OSTEOARTHRITIS 10/27/2008   Qualifier: Diagnosis of  By: Ronnald Ramp MD, [name].   Marland Kitchen Positive TB test   . Postherpetic neuralgia 03/27/2017  . Rheumatic fever   . Rheumatic fever   . SKIN CANCER, HX OF 10/27/2008   Qualifier: Diagnosis of  By: Ronnald Ramp MD, [name].   . Tachycardia 11/09/2017  . Thyroid disease   . Unspecified disorder of kidney and ureter   . URI 02/05/2009   Qualifier: Diagnosis of  By: Ronnald Ramp MD, [name].    Past Surgical History:  Procedure Laterality Date  . KNEE SURGERY Right    2005, 2008  . TONSILLECTOMY AND ADENOIDECTOMY     Family History  Problem Relation Age of Onset  . Cancer Mother   . Diabetes Mother   . Cancer Father   . Early death Father   . Diabetes Brother   . Cancer Brother        prostate  . Drug abuse Brother   . Birth defects Sister   . Diabetes Maternal Grandmother   . Thyroid disease Maternal Grandmother   . Stroke Maternal Grandmother   . Tremor Maternal Grandfather   . Obesity Son    Social History   Socioeconomic History  . Marital status: Married    Spouse name: Not on file  . Number of children: Not on file  . Years of education: Not on file  . Highest education level: Not on file  Occupational History  . Not on file  Tobacco Use  . Smoking status: Former Smoker    Types: Pipe, Landscape architect  . Smokeless tobacco: Never Used  Vaping Use  . Vaping Use: Never used  Substance and Sexual Activity  . Alcohol use: Yes    Alcohol/week: 1.0 standard drink    Types: 1 Cans of beer per week    Comment: occssionally  . Drug use: No  . Sexual activity: Yes  Other Topics  Concern  . Not on file  Social History Narrative  . Not on file   Social Determinants of Health   Financial Resource Strain: Low Risk   . Difficulty of Paying Living Expenses: Not hard at all  Food Insecurity: No Food Insecurity  . Worried About Charity fundraiser in the Last Year: Never true  . Ran Out of Food in the Last Year: Never true  Transportation Needs: No Transportation Needs  . Lack of Transportation (Medical): No  . Lack of Transportation (Non-Medical): No  Physical Activity: Insufficiently Active  . Days of Exercise per Week: 3 days  . Minutes of Exercise  per Session: 40 min  Stress: No Stress Concern Present  . Feeling of Stress : Not at all  Social Connections: Socially Integrated  . Frequency of Communication with Friends and Family: Once a week  . Frequency of Social Gatherings with Friends and Family: More than three times a week  . Attends Religious Services: More than 4 times per year  . Active Member of Clubs or Organizations: Yes  . Attends Archivist Meetings: 1 to 4 times per year  . Marital Status: Married    Tobacco Counseling Counseling given: Not Answered   Clinical Intake:  Pre-visit preparation completed: Yes  Pain : No/denies pain     BMI - recorded: 35.78 Nutritional Status: BMI > 30  Obese Nutritional Risks: None Diabetes: No  How often do you need to have someone help you when you read instructions, pamphlets, or other written materials from your doctor or pharmacy?: 1 - Never  Diabetic?no  Interpreter Needed?: No  Information entered by :: Charlott Rakes,  LPN   Activities of Daily Living In your present state of health, do you have any difficulty performing the following activities: 11/07/2019  Hearing? Y  Comment mild loss and tinnitus  Vision? N  Difficulty concentrating or making decisions? N  Walking or climbing stairs? N  Dressing or bathing? N  Doing errands, shopping? N  Preparing Food and eating ? N   Using the Toilet? N  In the past six months, have you accidently leaked urine? N  Do you have problems with loss of bowel control? N  Managing your Medications? N  Managing your Finances? N  Housekeeping or managing your Housekeeping? N  Some recent data might be hidden    Patient Care Team: Vivi Barrack, MD as PCP - General (Family Medicine) Josue Hector, MD as Consulting Physician (Cardiology) Juluis Rainier as Consulting Physician (Optometry) Madelin Rear, [facility] as Pharmacist (Pharmacist)  Indicate any recent Medical Services you may have received from other than [facility] providers in the past year (date may be approximate).     Assessment:   This is a routine wellness examination for Dale Wood.  Hearing/Vision screen  Hearing Screening   125Hz  250Hz  500Hz  1000Hz  2000Hz  3000Hz  4000Hz  6000Hz  8000Hz   Right ear:           Left ear:           Comments: Pt states he has tinnitus and mild loss,   Vision Screening Comments: Pt follows up annually with Dr Idolina Primer for eye exams  Dietary issues and exercise activities discussed: Current Exercise Habits: Home exercise routine, Time (Minutes): 45, Frequency (Times/Week): 2, Weekly Exercise (Minutes/Week): 90  Goals    . Increase physical activity     Continue 3 days a week at the [facility]. Goal of adding in the adjustable height treadmill to prepare for [place] trip    . Patient Stated     Lose 20lbs     . PharmD Care Plan     CARE PLAN ENTRY (see longitudinal plan of care for additional care plan information)  Current Barriers:  . Chronic Disease Management support, education, and care coordination needs related to Hypertension, Hyperlipidemia, and Prediabetes   Hypertension BP Readings from Last 3 Encounters:  09/09/19 (!) 149/68  08/29/19 (!) 154/82  08/28/19 (!) 150/90   . Pharmacist Clinical Goal(s): o Over the next 180 days, patient will work with PharmD and providers to achieve BP goal <130/80 . Current regimen:   o Losartan  100 mg once daily . Interventions: o Discussed diet/exercised recommendations, possibly starting on additional agent such as hydrochlorothiazide  . Patient self care activities - Over the next 180 days, patient will: o Check BP once daily, document, and provide at future appointments o Ensure daily salt intake < 1800 mg/day  Hyperlipidemia Lab Results  Component Value Date/Time   [facility]  08/29/2019 03:39 PM     Comment:     . LDL cholesterol not calculated. Triglyceride levels greater than 400 mg/dL invalidate calculated LDL results. . Reference range: <100 . Desirable range <100 mg/dL for primary prevention;   <70 mg/dL for patients with CHD or diabetic patients  with > or = 2 CHD risk factors. Marland Kitchen LDL-C is now calculated using the Martin-Hopkins  calculation, which is a validated novel method providing  better accuracy than the Friedewald equation in the  estimation of LDL-C.  Cresenciano Genre et al. Annamaria Helling. 3710;626(94): 2061-2068  (http://education.[facility].com/faq/FAQ164)    LDLDIRECT 116.0 11/22/2016 04:48 PM   . Pharmacist Clinical Goal(s): o Over the next 180 days, patient will work with PharmD and providers to achieve LDL goal < 70 . Current regimen:  o N/a . Interventions: o Diet/exercise recommendations . Patient self care activities - Over the next 180 days, patient will: o Continue current management  Prediabetes Lab Results  Component Value Date/Time   HGBA1C 6.4 (H) 08/29/2019 03:39 PM   . Pharmacist Clinical Goal(s): o Over the next 180 days, patient will work with PharmD and providers to maintain A1c goal <6.5% . Current regimen:  o N/a . Interventions: o Discussed diet/exercise recommendations . Patient self care activities - Over the next 180 days, patient will: o Check blood sugar if directed, document, and provide at future appointments o Contact provider with any episodes of hypoglycemia  Medication management . Pharmacist  Clinical Goal(s): o Over the next 180 days, patient will work with PharmD and providers to maintain optimal medication adherence . [facility] . Interventions o Comprehensive medication review performed. o Continue current medication management strategy . Patient self care activities - Over the next 180 days, patient will: o Take medications as prescribed o Report any questions or concerns to PharmD and/or provider(s) Initial goal documentation.      Depression Screen PHQ 2/9 Scores 11/07/2019 09/18/2018 09/08/2017 11/22/2016  PHQ - 2 Score 0 0 0 0    Fall Risk Fall Risk  11/07/2019 09/18/2018 09/08/2017 11/22/2016 07/30/2015  Falls in the past year? 0 0 No No No  Number falls in past yr: 0 0 - - -  Injury with Fall? 0 0 - - -  Risk for fall due to : Impaired vision - - - -  Follow up Falls prevention discussed Education provided - - -    Any stairs in or around the home? Yes  If so, are there any without handrails? No  Home free of loose throw rugs in walkways, pet beds, electrical cords, etc? Yes  Adequate lighting in your home to reduce risk of falls? Yes   ASSISTIVE DEVICES UTILIZED TO PREVENT FALLS:  Life alert? No  Use of a cane, walker or w/c? No  Grab bars in the bathroom? Yes  Shower chair or bench in shower? Yes  Elevated toilet seat or a handicapped toilet? Yes   TIMED UP AND GO:  Was the test performed? No .    Cognitive Function:     6CIT Screen 11/07/2019 09/18/2018 09/08/2017  What Year? 0 points 0 points  0 points  What month? 0 points 0 points 0 points  What time? - 0 points 0 points  Count back from 20 0 points 0 points 0 points  Months in reverse 0 points 0 points 0 points  Repeat phrase 0 points - -    Immunizations Immunization History  Administered Date(s) Administered  . Fluad Quad(high Dose 65+) 09/18/2018  . Influenza, High Dose Seasonal PF 11/22/2016  . Influenza-Unspecified 09/17/2016  . PFIZER SARS-COV-2 Vaccination  03/30/2019, 04/20/2019, 11/05/2019  . Pneumococcal Conjugate-13 11/22/2016  . Pneumococcal Polysaccharide-23 01/15/2019  . Td 01/17/2001  . Tdap 08/25/2010  . Zoster Recombinat (Shingrix) 10/25/2019    TDAP status: Up to date Flu Vaccine status: Declined, Education has been provided regarding the importance of this vaccine but patient still declined. Advised may receive this vaccine at local pharmacy or Health Dept. Aware to provide a copy of the vaccination record if obtained from local pharmacy or Health Dept. Verbalized acceptance and understanding. Postponed until 11/29/19, want to get at appt date Pneumococcal vaccine status: Up to date Covid-19 vaccine status: Completed vaccines  Qualifies for Shingles Vaccine? Yes   Zostavax completed No   Shingrix Completed?: No.    Education has been provided regarding the importance of this vaccine. Patient has been advised to call insurance company to determine out of pocket expense if they have not yet received this vaccine. Advised may also receive vaccine at local pharmacy or Health Dept. Verbalized acceptance and understanding.  Screening Tests Health Maintenance  Topic Date Due  . INFLUENZA VACCINE  11/29/2019 (Originally 08/18/2019)  . TETANUS/TDAP  08/24/2020  . COLONOSCOPY  10/11/2020  . COVID-19 Vaccine  Completed  . Hepatitis C Screening  Completed  . PNA vac Low Risk Adult  Completed    Health Maintenance  There are no preventive care reminders to display for this patient.  Colorectal cancer screening: Completed 10/12/10. Repeat every 10 years   Additional Screening:  Hepatitis C Screening: Completed 10/19/15  Vision Screening: Recommended annual ophthalmology exams for early detection of glaucoma and other disorders of the eye. Is the patient up to date with their annual eye exam?  Yes  Who is the provider or what is the name of the office in which the patient attends annual eye exams? Dr Alois Cliche   Dental Screening:  Recommended annual dental exams for proper oral hygiene  Community Resource Referral / Chronic Care Management: CRR required this visit?  No   CCM required this visit?  No      Plan:     I have personally reviewed and noted the following in the patient's chart:   . Medical and social history . Use of alcohol, tobacco or illicit drugs  . Current medications and supplements . Functional ability and status . Nutritional status . Physical activity . Advanced directives . List of other physicians . Hospitalizations, surgeries, and ER visits in previous 12 months . Vitals . Screenings to include cognitive, depression, and falls . Referrals and appointments  In addition, I have reviewed and discussed with patient certain preventive protocols, quality metrics, and best practice recommendations. A written personalized care plan for preventive services as well as general preventive health recommendations were provided to patient.     Willette Brace, LPN   [id number]   Nurse Notes: Patient stated that he wanted to discuss new Blood pressure medications with  You at next visit on  12/10/19

## 2019-11-24 ENCOUNTER — Encounter: Payer: Self-pay | Admitting: Family Medicine

## 2019-11-25 ENCOUNTER — Other Ambulatory Visit: Payer: Self-pay | Admitting: *Deleted

## 2019-11-25 ENCOUNTER — Other Ambulatory Visit: Payer: Self-pay

## 2019-11-25 MED ORDER — CYCLOBENZAPRINE HCL 10 MG PO TABS: 10.0000 mg | ORAL_TABLET | Freq: Three times a day (TID) | ORAL | 0 refills | Status: DC

## 2019-12-09 ENCOUNTER — Telehealth: Payer: PPO

## 2019-12-10 ENCOUNTER — Other Ambulatory Visit: Payer: Self-pay

## 2019-12-10 ENCOUNTER — Encounter: Payer: Self-pay | Admitting: Family Medicine

## 2019-12-10 ENCOUNTER — Ambulatory Visit (INDEPENDENT_AMBULATORY_CARE_PROVIDER_SITE_OTHER): Payer: PPO | Admitting: Family Medicine

## 2019-12-10 VITALS — BP 138/76 | HR 77 | Temp 98.0°F | Ht 72.0 in | Wt 248.4 lb

## 2019-12-10 DIAGNOSIS — R739 Hyperglycemia, unspecified: Secondary | ICD-10-CM

## 2019-12-10 DIAGNOSIS — E038 Other specified hypothyroidism: Secondary | ICD-10-CM | POA: Diagnosis not present

## 2019-12-10 NOTE — Patient Instructions (Signed)
It was very nice to see you today!  No changes today. We will check blood work today.  I will see you back in 6 months. Please  Take care, Dr [name]  Please try these tips to maintain a healthy lifestyle:   Eat at least 3 REAL meals and 1-2 snacks per day.  Aim for no more than 5 hours between eating.  If you eat breakfast, please do so within one hour of getting up.    Each meal should contain half fruits/vegetables, one quarter protein, and one quarter carbs (no bigger than a computer mouse)   Cut down on sweet beverages. This includes juice, soda, and sweet tea.     Drink at least 1 glass of water with each meal and aim for at least 8 glasses per day   Exercise at least 150 minutes every week.

## 2019-12-10 NOTE — Assessment & Plan Note (Signed)
Check A1c.  He has lost about 20 pounds since last visit.  Congratulated patient.

## 2019-12-10 NOTE — Assessment & Plan Note (Signed)
Tolerating Synthroid 50 mcg daily well.  Will check TSH.

## 2019-12-10 NOTE — Progress Notes (Signed)
   Dale Wood is a 69 y.o. male who presents today for an office visit.  Assessment/Plan:  Chronic Problems Addressed Today: Subclinical hypothyroidism Tolerating Synthroid 50 mcg daily well.  Will check TSH.  Hyperglycemia Check A1c.  He has lost about 20 pounds since last visit.  Congratulated patient.     Subjective:  HPI:  See A/p.         Objective:  Physical Exam: BP 138/76   Pulse 77   Temp 98 F (36.7 C) (Temporal)   Ht 6' (1.829 m)   Wt 248 lb 6.4 oz (112.7 kg)   SpO2 97%   BMI 33.69 kg/m   Wt Readings from Last 3 Encounters:  12/10/19 248 lb 6.4 oz (112.7 kg)  09/09/19 263 lb 12.8 oz (119.7 kg)  08/29/19 268 lb (121.6 kg)    Gen: No acute distress, resting comfortably CV: Regular rate and rhythm with no murmurs appreciated Pulm: Normal work of breathing, clear to auscultation bilaterally with no crackles, wheezes, or rhonchi Neuro: Grossly normal, moves all extremities Psych: Normal affect and thought content      Thor Nannini M. [name], MD 12/10/2019 3:37 PM

## 2019-12-11 LAB — HEMOGLOBIN A1C
Hgb A1c MFr Bld: 6.2 % of total Hgb — ABNORMAL HIGH (ref <5.7)
Mean Plasma Glucose: 131 (calc)
eAG (mmol/L): 7.3 (calc)

## 2019-12-11 LAB — TSH: TSH: 4.66 mIU/L — ABNORMAL HIGH (ref 0.40–4.50)

## 2019-12-11 NOTE — Progress Notes (Signed)
Please inform patient of the following:  Labs are all stable. Would like for him to continue current treatment plan. We can recheck in 6-12 months.  Dale Wood. [name], MD 12/11/2019 9:34 AM

## 2019-12-25 ENCOUNTER — Ambulatory Visit: Payer: PPO

## 2019-12-25 NOTE — Chronic Care Management (AMB) (Signed)
  Chronic Care Management   Outreach Note   Name: Dale Wood MRN: [id number] DOB: [birth year]  Referred by: Vivi Barrack, MD Reason for referral: Telephone Appointment with [facility] Pharmacist, Madelin Rear.   elephone appointment with clinical pharmacist today (12/25/2019) at 1pm. If patient immediately returns call, transfer to [phone]. Otherwise, please provide [phone] so patient can reschedule visit.   Madelin Rear, Pharm.D., BCGP Clinical Pharmacist [facility] [phone]

## 2019-12-25 NOTE — Progress Notes (Unsigned)
Not seen

## 2020-01-28 ENCOUNTER — Emergency Department (HOSPITAL_COMMUNITY)
Admission: EM | Admit: 2020-01-28 | Discharge: 2020-01-29 | Disposition: A | Discharge status: Home / Self Care | Payer: PPO | Attending: Emergency Medicine | Admitting: Emergency Medicine

## 2020-01-28 DIAGNOSIS — Z85828 Personal history of other malignant neoplasm of skin: Secondary | ICD-10-CM | POA: Diagnosis not present

## 2020-01-28 DIAGNOSIS — I213 ST elevation (STEMI) myocardial infarction of unspecified site: Secondary | ICD-10-CM | POA: Diagnosis not present

## 2020-01-28 DIAGNOSIS — J9811 Atelectasis: Secondary | ICD-10-CM | POA: Diagnosis not present

## 2020-01-28 DIAGNOSIS — R079 Chest pain, unspecified: Secondary | ICD-10-CM | POA: Diagnosis not present

## 2020-01-28 DIAGNOSIS — R1110 Vomiting, unspecified: Secondary | ICD-10-CM | POA: Diagnosis not present

## 2020-01-28 DIAGNOSIS — R0602 Shortness of breath: Secondary | ICD-10-CM | POA: Diagnosis not present

## 2020-01-28 DIAGNOSIS — I10 Essential (primary) hypertension: Secondary | ICD-10-CM | POA: Diagnosis not present

## 2020-01-28 DIAGNOSIS — Z20822 Contact with and (suspected) exposure to covid-19: Secondary | ICD-10-CM | POA: Insufficient documentation

## 2020-01-28 DIAGNOSIS — Z79899 Other long term (current) drug therapy: Secondary | ICD-10-CM | POA: Diagnosis not present

## 2020-01-28 DIAGNOSIS — R112 Nausea with vomiting, unspecified: Secondary | ICD-10-CM | POA: Insufficient documentation

## 2020-01-28 DIAGNOSIS — R0789 Other chest pain: Secondary | ICD-10-CM | POA: Diagnosis not present

## 2020-01-28 DIAGNOSIS — Z87891 Personal history of nicotine dependence: Secondary | ICD-10-CM | POA: Diagnosis not present

## 2020-01-28 DIAGNOSIS — R1013 Epigastric pain: Secondary | ICD-10-CM | POA: Diagnosis not present

## 2020-01-28 NOTE — ED Triage Notes (Signed)
Pt bib gems c/o sharp, non radiating, chest pain that started at 3pm with n/v. No significant cardiac history per EMS. .8mg  nitro and 324 mg aspirin given PTA without relief.   EMS vitals:  Initial: 240/230 -- after nitro: 140/90 HR:78 RR:22 Spo2: 98 2L

## 2020-01-29 ENCOUNTER — Emergency Department (HOSPITAL_COMMUNITY): Payer: PPO

## 2020-01-29 DIAGNOSIS — J9811 Atelectasis: Secondary | ICD-10-CM | POA: Diagnosis not present

## 2020-01-29 DIAGNOSIS — R079 Chest pain, unspecified: Secondary | ICD-10-CM | POA: Diagnosis not present

## 2020-01-29 DIAGNOSIS — R1110 Vomiting, unspecified: Secondary | ICD-10-CM | POA: Diagnosis not present

## 2020-01-29 LAB — CBC WITH DIFFERENTIAL/PLATELET
Abs Immature Granulocytes: 0.06 10*3/uL (ref 0.00–0.07)
Basophils Absolute: 0.1 10*3/uL (ref 0.0–0.1)
Basophils Relative: 1 %
Eosinophils Absolute: 0.1 10*3/uL (ref 0.0–0.5)
Eosinophils Relative: 1 %
HCT: 43.6 % (ref 39.0–52.0)
Hemoglobin: 14.3 g/dL (ref 13.0–17.0)
Immature Granulocytes: 0 %
Lymphocytes Relative: 10 %
Lymphs Abs: 1.4 10*3/uL (ref 0.7–4.0)
MCH: 26 pg (ref 26.0–34.0)
MCHC: 32.8 g/dL (ref 30.0–36.0)
MCV: 79.3 fL — ABNORMAL LOW (ref 80.0–100.0)
Monocytes Absolute: 0.8 10*3/uL (ref 0.1–1.0)
Monocytes Relative: 6 %
Neutro Abs: 11.3 10*3/uL — ABNORMAL HIGH (ref 1.7–7.7)
Neutrophils Relative %: 82 %
Platelets: 324 10*3/uL (ref 150–400)
RBC: 5.5 MIL/uL (ref 4.22–5.81)
RDW: 18.9 % — ABNORMAL HIGH (ref 11.5–15.5)
WBC: 13.8 10*3/uL — ABNORMAL HIGH (ref 4.0–10.5)
nRBC: 0 % (ref 0.0–0.2)

## 2020-01-29 LAB — COMPREHENSIVE METABOLIC PANEL
ALT: 23 U/L (ref 0–44)
AST: 31 U/L (ref 15–41)
Albumin: 3.8 g/dL (ref 3.5–5.0)
Alkaline Phosphatase: 88 U/L (ref 38–126)
Anion gap: 11 (ref 5–15)
BUN: 13 mg/dL (ref 8–23)
CO2: 24 mmol/L (ref 22–32)
Calcium: 9.1 mg/dL (ref 8.9–10.3)
Chloride: 103 mmol/L (ref 98–111)
Creatinine, Ser: 1.37 mg/dL — ABNORMAL HIGH (ref 0.61–1.24)
GFR, Estimated: 56 mL/min — ABNORMAL LOW (ref >60)
Glucose, Bld: 180 mg/dL — ABNORMAL HIGH (ref 70–99)
Potassium: 4.2 mmol/L (ref 3.5–5.1)
Sodium: 138 mmol/L (ref 135–145)
Total Bilirubin: 1.4 mg/dL — ABNORMAL HIGH (ref 0.3–1.2)
Total Protein: 6.7 g/dL (ref 6.5–8.1)

## 2020-01-29 LAB — ETHANOL: Alcohol, Ethyl (B): <10 mg/dL (ref <10)

## 2020-01-29 LAB — RESP PANEL BY RT-PCR (FLU A&B, COVID) ARPGX2
Influenza A by PCR: NEGATIVE
Influenza B by PCR: NEGATIVE
SARS Coronavirus 2 by RT PCR: NEGATIVE

## 2020-01-29 LAB — LIPASE, BLOOD: Lipase: 26 U/L (ref 11–51)

## 2020-01-29 LAB — TROPONIN I (HIGH SENSITIVITY)
Troponin I (High Sensitivity): 10 ng/L (ref <18)
Troponin I (High Sensitivity): 8 ng/L (ref <18)

## 2020-01-29 MED ORDER — ALUM & MAG HYDROXIDE-SIMETH 200-200-20 MG/5ML PO SUSP
30.0000 mL | Freq: Once | ORAL | Status: AC
  Administered 2020-01-29: 30 mL via ORAL
  Filled 2020-01-29: qty 30

## 2020-01-29 MED ORDER — FAMOTIDINE 20 MG PO TABS: 20.0000 mg | ORAL_TABLET | Freq: Two times a day (BID) | ORAL | 0 refills | Status: DC

## 2020-01-29 MED ORDER — IOHEXOL 300 MG/ML  SOLN
100.0000 mL | Freq: Once | INTRAMUSCULAR | Status: AC | PRN
  Administered 2020-01-29: 100 mL via INTRAVENOUS

## 2020-01-29 MED ORDER — ONDANSETRON HCL 4 MG/2ML IJ SOLN
4.0000 mg | Freq: Once | INTRAMUSCULAR | Status: AC
  Administered 2020-01-29: 4 mg via INTRAVENOUS
  Filled 2020-01-29: qty 2

## 2020-01-29 MED ORDER — LIDOCAINE VISCOUS HCL 2 % MT SOLN
15.0000 mL | Freq: Once | OROMUCOSAL | Status: AC
  Administered 2020-01-29: 15 mL via ORAL
  Filled 2020-01-29: qty 15

## 2020-01-29 MED ORDER — MORPHINE SULFATE (PF) 4 MG/ML IV SOLN
4.0000 mg | Freq: Once | INTRAVENOUS | Status: AC
  Administered 2020-01-29: 4 mg via INTRAVENOUS
  Filled 2020-01-29: qty 1

## 2020-01-29 NOTE — ED Notes (Addendum)
Ambulated pt in the hallway, pts O2 sats dropped to 96% on RA. Pt denies feeling SOB, exhibited steady gait.

## 2020-01-29 NOTE — Discharge Instructions (Addendum)
Your cardiac and abdominal work-up today.  Symptoms likely due to acid reflux/ gastritis.  I would stop your diclofenac which is an NSAID and can cause these problems. Start pepcid and take as directed for 2 weeks. Follow-up with your primary care doctor. Return to the ED for new or worsening symptoms.

## 2020-01-29 NOTE — ED Provider Notes (Signed)
[facility] DEPARTMENT Provider Note   CSN: [id number] Arrival date & time: 01/28/20  2357     History Chief Complaint  Patient presents with  . Chest Pain    Dale Wood is a 70 y.o. male.  The history is provided by the patient and medical records.  Chest Pain Associated symptoms: nausea and vomiting     70 y.o. M with hx of epigastric pain, GERD, HLP, basal cell carcinoma, RF, HTN, arthritis, kidney stones, presenting to the ED with central chest pain.  This began around 3PM today, has been constant since onset with some "spikes" on pain intermittently.  No radiation of pain, has remained central since it began.  Has had associated nausea and vomiting.  Denies SOB, back pain, cough, fever, chills.  Denies known cardiac history.  He is not a smoker.  Past Medical History:  Diagnosis Date  . Abdominal pain, epigastric 10/27/2008   Qualifier: Diagnosis of  By: Ronnald Ramp MD, [name].   Marland Kitchen ANKLE INJURY, RIGHT 09/22/2009   Qualifier: Diagnosis of  By: Nathaneil Canary, CMA, [name]    . BASAL CELL CARCINOMA, FACE 10/27/2008   Qualifier: Diagnosis of  By: Ronnald Ramp MD, [name].   Marland Kitchen BASAL CELL CARCINOMA, FACE 10/27/2008   Qualifier: Diagnosis of By: Ronnald Ramp MD, [name].   . Cancer ([place])    Skin Cancer on Nose  . CELLULITIS, FOOT 09/22/2009   Qualifier: Diagnosis of  By: Ronnald Ramp MD, [name].   . Constipation 01/05/2017  . DYSMETABOLIC SYNDROME 0/97/3532   Qualifier: Diagnosis of  By: Ronnald Ramp MD, [place] 10/27/2008   Qualifier: Diagnosis of  By: Ronnald Ramp MD, [name].   . ERECTILE DYSFUNCTION, ORGANIC 10/27/2008   Qualifier: Diagnosis of  By: Ronnald Ramp MD, [name].   . Headache 07/30/2015  . Heart murmur   . Hyperlipidemia   . HYPERLIPIDEMIA 10/27/2008   Qualifier: Diagnosis of  By: Ronnald Ramp MD, [name].   . Hypertension   . HYPOGONADISM 11/26/2008   Qualifier: Diagnosis of  By: Ronnald Ramp MD, [name].   . Kidney stones   . Low testosterone   .  NEPHROLITHIASIS, HX OF 10/27/2008   Qualifier: Diagnosis of  By: Ronnald Ramp MD, [name].   . Nephrolithiasis, uric acid   . OSTEOARTHRITIS 10/27/2008   Qualifier: Diagnosis of  By: Ronnald Ramp MD, [name].   Marland Kitchen Positive TB test   . Postherpetic neuralgia 03/27/2017  . Rheumatic fever   . Rheumatic fever   . SKIN CANCER, HX OF 10/27/2008   Qualifier: Diagnosis of  By: Ronnald Ramp MD, [name].   . Tachycardia 11/09/2017  . Thyroid disease   . Unspecified disorder of kidney and ureter   . URI 02/05/2009   Qualifier: Diagnosis of  By: Ronnald Ramp MD, [name].     Patient Active Problem List   Diagnosis Date Noted  . Subclinical hypothyroidism 09/09/2019  . Hyperglycemia 08/29/2019  . Low back pain with sciatica 07/09/2019  . Hyperlipidemia 01/05/2017  . Hypertension 01/05/2017  . ANKLE INJURY, RIGHT 09/22/2009  . ERECTILE DYSFUNCTION, ORGANIC 10/27/2008  . OSTEOARTHRITIS 10/27/2008  . ABDOMINAL PAIN, EPIGASTRIC 10/27/2008  . SKIN CANCER, HX OF 10/27/2008  . NEPHROLITHIASIS, HX OF 10/27/2008  . BASAL CELL CARCINOMA, FACE 10/27/2008    Past Surgical History:  Procedure Laterality Date  . KNEE SURGERY Right    2005, 2008  . TONSILLECTOMY AND ADENOIDECTOMY         Family History  Problem Relation Age of Onset  . Cancer Mother   . Diabetes Mother   . Cancer Father   . Early death Father   . Diabetes Brother   . Cancer Brother        prostate  . Drug abuse Brother   . Birth defects Sister   . Diabetes Maternal Grandmother   . Thyroid disease Maternal Grandmother   . Stroke Maternal Grandmother   . Tremor Maternal Grandfather   . Obesity Son     Social History   Tobacco Use  . Smoking status: Former Smoker    Types: Pipe, Landscape architect  . Smokeless tobacco: Never Used  Vaping Use  . Vaping Use: Never used  Substance Use Topics  . Alcohol use: Yes    Alcohol/week: 1.0 standard drink    Types: 1 Cans of beer per week    Comment: occssionally  . Drug use: No    Home  Medications Prior to Admission medications   Medication Sig Start Date End Date Taking? Authorizing Provider  acetaminophen (TYLENOL) 325 MG tablet Take 650 mg by mouth every 6 (six) hours as needed.    [provider]  cyclobenzaprine (FLEXERIL) 10 MG tablet Take 1 tablet (10 mg total) by mouth 3 (three) times daily. 11/25/19   Vivi Barrack, MD  diclofenac (VOLTAREN) 75 MG EC tablet TAKE 1 TABLET(75 MG) BY MOUTH TWICE DAILY 09/16/19   Vivi Barrack, MD  levothyroxine (SYNTHROID) 50 MCG tablet Take 50 mcg by mouth daily. 11/27/19   [provider]  losartan (COZAAR) 100 MG tablet Take 1 tablet (100 mg total) by mouth daily. 08/09/19   Vivi Barrack, MD  sildenafil (VIAGRA) 100 MG tablet Take 0.5-1 tablets (50-100 mg total) by mouth daily as needed for erectile dysfunction. 01/15/19   Vivi Barrack, MD    Allergies    Androgel [testosterone] and Penicillins  Review of Systems   Review of Systems  Cardiovascular: Positive for chest pain.  Gastrointestinal: Positive for nausea and vomiting.  All other systems reviewed and are negative.   Physical Exam Updated Vital Signs BP (!) 147/84   Pulse 61   Temp (!) 97.5 F (36.4 C) (Oral)   Resp 18   Ht 6' (1.829 m)   Wt 112 kg   SpO2 99%   BMI 33.49 kg/m   Physical Exam Vitals and nursing note reviewed.  Constitutional:      Appearance: He is well-developed and well-nourished.  HENT:     Head: Normocephalic and atraumatic.     Mouth/Throat:     Mouth: Oropharynx is clear and moist.  Eyes:     Extraocular Movements: EOM normal.     Conjunctiva/sclera: Conjunctivae normal.     Pupils: Pupils are equal, round, and reactive to light.  Cardiovascular:     Rate and Rhythm: Normal rate and regular rhythm.     Heart sounds: Normal heart sounds.  Pulmonary:     Effort: Pulmonary effort is normal.     Breath sounds: Normal breath sounds.  Abdominal:     General: Bowel sounds are normal.     Palpations: Abdomen  is soft.     Tenderness: There is abdominal tenderness in the epigastric area.    Musculoskeletal:        General: Normal range of motion.     Cervical back: Normal range of motion.  Skin:    General: Skin is warm and dry.  Neurological:     Mental  Status: He is alert and oriented to person, place, and time.  Psychiatric:        Mood and Affect: Mood and affect normal.     ED Results / Procedures / Treatments   Labs (all labs ordered are listed, but only abnormal results are displayed) Labs Reviewed  CBC WITH DIFFERENTIAL/PLATELET - Abnormal; Notable for the following components:      Result Value   WBC 13.8 (*)    MCV 79.3 (*)    RDW 18.9 (*)    Neutro Abs 11.3 (*)    All other components within normal limits  COMPREHENSIVE METABOLIC PANEL - Abnormal; Notable for the following components:   Glucose, Bld 180 (*)    Creatinine, Ser 1.37 (*)    Total Bilirubin 1.4 (*)    GFR, Estimated 56 (*)    All other components within normal limits  RESP PANEL BY RT-PCR (FLU A&B, COVID) ARPGX2  LIPASE, BLOOD  ETHANOL  TROPONIN I (HIGH SENSITIVITY)  TROPONIN I (HIGH SENSITIVITY)    EKG EKG Interpretation  Date/Time:  Wednesday January 29 2020 02:38:01 EST Ventricular Rate:  57 PR Interval:    QRS Duration: 106 QT Interval:  446 QTC Calculation: 435 R Axis:   64 Text Interpretation: Sinus rhythm Abnormal inferior Q waves No significant change since last tracing Confirmed by Deno Etienne [phone]) on 01/29/2020 5:14:48 AM   Radiology CT ABDOMEN PELVIS W CONTRAST  Result Date: 01/29/2020 CLINICAL DATA:  Epigastric pain, vomiting EXAM: CT ABDOMEN AND PELVIS WITH CONTRAST TECHNIQUE: Multidetector CT imaging of the abdomen and pelvis was performed using the standard protocol following bolus administration of intravenous contrast. CONTRAST:  131m OMNIPAQUE IOHEXOL 300 MG/ML  SOLN COMPARISON:  03/22/2018 FINDINGS: Lower chest: Bibasilar atelectasis or scarring. Heart is borderline in  size. Hepatobiliary: No focal hepatic abnormality. Small stones within the gallbladder. No biliary ductal dilatation. Pancreas: No focal abnormality or ductal dilatation. Spleen: No focal abnormality.  Normal size. Adrenals/Urinary Tract: Punctate nonobstructing stone in the upper pole of the left kidney. No ureteral stones or hydronephrosis. Adrenal glands and urinary bladder unremarkable. Stomach/Bowel: Normal appendix. Stomach, large and small bowel grossly unremarkable. Vascular/Lymphatic: No evidence of aneurysm or adenopathy. Reproductive: Prostate enlargement Other: No free fluid or free air. Musculoskeletal: No acute bony abnormality. IMPRESSION: Cholelithiasis. Punctate left upper pole nephrolithiasis. Bibasilar atelectasis or scarring. Prostate enlargement. No acute findings. Electronically Signed   By: KRolm BaptiseM.D.   On: 01/29/2020 03:32   DG Chest Port 1 View  Result Date: 01/29/2020 CLINICAL DATA:  Chest pain, vomiting EXAM: PORTABLE CHEST 1 VIEW COMPARISON:  11/08/2017 FINDINGS: Low lung volumes, bibasilar atelectasis. Heart is normal size. No effusions. No acute bony abnormality. IMPRESSION: Low lung volumes, bibasilar atelectasis. Electronically Signed   By: KRolm BaptiseM.D.   On: 01/29/2020 00:32    Procedures Procedures (including critical care time)  Medications Ordered in ED Medications  morphine 4 MG/ML injection 4 mg (4 mg Intravenous Given 01/29/20 0017)  morphine 4 MG/ML injection 4 mg (4 mg Intravenous Given 01/29/20 0258)  ondansetron (ZOFRAN) injection 4 mg (4 mg Intravenous Given 01/29/20 0259)  alum & mag hydroxide-simeth (MAALOX/MYLANTA) 200-200-20 MG/5ML suspension 30 mL (30 mLs Oral Given 01/29/20 0301)    And  lidocaine (XYLOCAINE) 2 % viscous mouth solution 15 mL (15 mLs Oral Given 01/29/20 0301)  iohexol (OMNIPAQUE) 300 MG/ML solution 100 mL (100 mLs Intravenous Contrast Given 01/29/20 0321)    ED Course  I have reviewed the triage vital  signs and the nursing  notes.  Pertinent labs & imaging results that were available during my care of the patient were reviewed by me and considered in my medical decision making (see chart for details).    MDM Rules/Calculators/A&P    70 year old male presenting to the ED with reported chest pain, however on exam this seems more epigastric.  He has had nausea and vomiting today.  Denies any known cardiac history but does have documented history of epigastric abdominal pain and acid reflux.  He is nontoxic in appearance but does appear uncomfortable.  He has not had any relief with aspirin and nitro by EMS.  EKG is nonischemic.  Labs pending, will include LFTs and lipase.  Chest x-ray is clear.  Morphine given for pain.  Initial labs reassuring, normal LFTs, alk phos, and lipase.  Bili is slightly above normal.  Troponin is negative.  COVID and flu screen negative.  Chest x-ray is clear.  Patient with some continued pain.  Given additional morphine and GI cocktail.  Will obtain CT scan for further evaluation.  CT negative for any acute findings.  Delta Lance Bosch is also negative.  States he is feeling better after GI cocktail.  He has been able to tolerate some water without difficulty.    5:20 AM Patient got up and walked to bathroom and upon returning to room had brief desaturation down to 85-87% per NT.  On re-check, denies recurrent pain or feeling SOB t o me.  Remains without URI symptoms.  Patient able to ambulate up and down the hall and maintain saturations of 96% or greater.  At this point, feel he is stable for discharge home.  It does appear he has been taking diclofenac-- this may be causing increased GERD/gastritis causing his symptoms today.  Will start on pepcid x2 weeks, have him stop NSAIDs and follow-up closely with PCP.  He may return here for any new/acute changes.  Final Clinical Impression(s) / ED Diagnoses Final diagnoses:  Chest pain in adult  Non-intractable vomiting with nausea, unspecified  vomiting type    Rx / DC Orders ED Discharge Orders         Ordered    famotidine (PEPCID) 20 MG tablet  2 times daily        01/29/20 0525           Larene Pickett, PA-C 01/29/20 [place], [place], DO 01/29/20 [phone]

## 2020-01-31 ENCOUNTER — Other Ambulatory Visit: Payer: Self-pay

## 2020-01-31 ENCOUNTER — Encounter: Payer: Self-pay | Admitting: Family Medicine

## 2020-01-31 ENCOUNTER — Ambulatory Visit (INDEPENDENT_AMBULATORY_CARE_PROVIDER_SITE_OTHER): Payer: PPO | Admitting: Family Medicine

## 2020-01-31 VITALS — BP 133/73 | HR 85 | Temp 97.8°F | Ht 72.0 in | Wt 246.0 lb

## 2020-01-31 DIAGNOSIS — K8080 Other cholelithiasis without obstruction: Secondary | ICD-10-CM | POA: Diagnosis not present

## 2020-01-31 NOTE — Patient Instructions (Signed)
It was very nice to see you today!  You have gallstones.  I think this is what caused your symptoms.  I will place referral to see a surgeon to discuss removal.  Take care, Dr [name]  Please try these tips to maintain a healthy lifestyle:   Eat at least 3 REAL meals and 1-2 snacks per day.  Aim for no more than 5 hours between eating.  If you eat breakfast, please do so within one hour of getting up.    Each meal should contain half fruits/vegetables, one quarter protein, and one quarter carbs (no bigger than a computer mouse)   Cut down on sweet beverages. This includes juice, soda, and sweet tea.     Drink at least 1 glass of water with each meal and aim for at least 8 glasses per day   Exercise at least 150 minutes every week.     Cholelithiasis  Cholelithiasis happens when gallstones form in the gallbladder. The gallbladder stores bile. Bile is a fluid that helps digest fats. Bile can harden and form into gallstones. If they cause a blockage, they can cause pain (gallbladder attack). What are the causes? This condition may be caused by:  Some blood diseases, such as sickle cell anemia.  Too much of a fat-like substance (cholesterol) in your bile.  Not enough bile salts in your bile. These salts help the body absorb and digest fats.  The gallbladder not emptying fully or often enough. This is common in pregnant women. What increases the risk? The following factors may make you more likely to develop this condition:  Being male.  Being pregnant many times.  Eating a lot of fried foods, fat, and refined carbs (refined carbohydrates).  Being very overweight (obese).  Being older than age 64.  Using medicines with male hormones in them for a long time.  Losing weight fast.  Having gallstones in your family.  Having some health problems, such as diabetes, Crohn's disease, or liver disease. What are the signs or symptoms? Often, there may be gallstones  but no symptoms. These gallstones are called silent gallstones. If a gallstone causes a blockage, you may get sudden pain. The pain:  Can be in the upper right part of your belly (abdomen).  Normally comes at night or after you eat.  Can last an hour or more.  Can spread to your right shoulder, back, or chest.  Can feel like discomfort, burning, or fullness in the upper part of your belly (indigestion). If the blockage lasts more than a few hours, you can get an infection or swelling. You may:  Feel like you may vomit.  Vomit.  Feel bloated.  Have belly pain for 5 hours or more.  Feel tender in your belly, often in the upper right part and under your ribs.  Have fever or chills.  Have skin or the white parts of your eyes turn yellow (jaundice).  Have dark pee (urine) or pale poop (stool). How is this treated? Treatment for this condition depends on how bad you feel. If you have symptoms, you may need:  Home care, if symptoms are not very bad. ? Do not eat for 12-24 hours. Drink only water and clear liquids. ? Start to eat simple or clear foods after 1 or 2 days. Try broths and crackers. ? You may need medicines for pain or stomach upset or both. ? If you have an infection, you will need antibiotics.  A hospital stay, if you have  very bad pain or a very bad infection.  Surgery to remove your gallbladder. You may need this if: ? Gallstones keep coming back. ? You have very bad symptoms.  Medicines to break up gallstones. Medicines: ? Are best for small gallstones. ? May be used for up to 6-12 months.  A procedure to find and take out gallstones or to break up gallstones. Follow these instructions at home: Medicines  Take over-the-counter and prescription medicines only as told by your doctor.  If you were prescribed an antibiotic medicine, take it as told by your doctor. Do not stop taking the antibiotic even if you start to feel better.  Ask your doctor if the  medicine prescribed to you requires you to avoid driving or using machinery. Eating and drinking  Drink enough fluid to keep your urine pale yellow. Drink water or clear fluids. This is important when you have pain.  Eat healthy foods. Choose: ? Fewer fatty foods, such as fried foods. ? Fewer refined carbs. Avoid breads and grains that are highly processed, such as white bread and white rice. Choose whole grains, such as whole-wheat bread and brown rice. ? More fiber. Almonds, fresh fruit, and beans are healthy sources. General instructions  Keep a healthy weight.  Keep all follow-up visits as told by your doctor. This is important. Where to find more information  [employer] of Diabetes and Digestive and Kidney Diseases: [email or web address] Contact a doctor if:  You have sudden pain in the upper right part of your belly. Pain might spread to your right shoulder, back, or chest.  You have been diagnosed with gallstones that have no symptoms and you get: ? Belly pain. ? Discomfort, burning, or fullness in the upper part of your abdomen.  You have dark urine or pale stools. Get help right away if:  You have sudden pain in the upper right part of your abdomen, and the pain lasts more than 2 hours.  You have pain in your abdomen, and: ? It lasts more than 5 hours. ? It keeps getting worse.  You have a fever or chills.  You keep feeling like you may vomit.  You keep vomiting.  Your skin or the white parts of your eyes turn yellow. Summary  Cholelithiasis happens when gallstones form in the gallbladder.  This condition may be caused by a blood disease, too much of a fat-like substance in the bile, or not enough bile salts in bile.  Treatment for this condition depends on how bad you feel.  If you have symptoms, do not eat or drink. You may need medicines. You may need a hospital stay for very bad pain or a very bad infection.  You may need surgery if gallstones keep  coming back or if you have very bad symptoms. This information is not intended to replace advice given to you by your health care provider. Make sure you discuss any questions you have with your health care provider. Document Revised: 02/22/2019 Document Reviewed: 11/26/2018 Elsevier Patient Education  2021 [employer].

## 2020-01-31 NOTE — Progress Notes (Addendum)
   Dale Wood is a 70 y.o. male who presents today for an office visit.  Assessment/Plan:  New/Acute Problems: Cholelithiasis History consistent with symptomatic cholelithiasis/biliary colic.  Doubt cardiac etiology given work-up in the ED and atypical history.  Peptic ulcer disease also consideration however much less likely given that symptoms have not returned and have not been recurrent or persistent.  Will place referral to surgery to discuss cholecystectomy.  Discussed reasons to return to care or seek emergent care.    Subjective:  HPI:  Patient here for ED follow-up.  Went to the ED on 01/28/2020 with chest pain.On arrival to ED had epigastric pain with nausea and vomiting.  EKG showed possible hyperacute T waves.  This was repeated multiple times.  Delta troponin was negative.  CT scan was negative.  Was given GI cocktail and symptoms improved.  Other labs including bilirubin and lipase were normal.  Still has a live abdominal pain but no further episodes of severe pain, nausea, or vomiting.        Objective:  Physical Exam: BP 133/73   Pulse 85   Temp 97.8 F (36.6 C) (Temporal)   Ht 6' (1.829 m)   Wt 246 lb (111.6 kg)   SpO2 94%   BMI 33.36 kg/m   Gen: No acute distress, resting comfortably Neuro: Grossly normal, moves all extremities Psych: Normal affect and thought content  Time Spent: 45 minutes of total time was spent on the date of the encounter performing the following actions: chart review prior to seeing the patient including recent ED visit, obtaining history, performing a medically necessary exam, counseling on the treatment plan, placing orders, and documenting in our EHR.        [name]. [name], MD 01/31/2020 4:27 PM

## 2020-02-01 ENCOUNTER — Other Ambulatory Visit: Payer: Self-pay | Admitting: Family Medicine

## 2020-02-02 ENCOUNTER — Encounter: Payer: Self-pay | Admitting: Family Medicine

## 2020-02-03 ENCOUNTER — Encounter: Payer: Self-pay | Admitting: Family Medicine

## 2020-02-04 NOTE — Telephone Encounter (Signed)
Please advise 

## 2020-02-05 NOTE — Telephone Encounter (Signed)
Please can you check on this referral

## 2020-02-06 ENCOUNTER — Encounter: Payer: Self-pay | Admitting: Family Medicine

## 2020-02-07 NOTE — Telephone Encounter (Signed)
Can you check on his referral please, thanks

## 2020-02-07 NOTE — Telephone Encounter (Signed)
Please advise 

## 2020-02-13 ENCOUNTER — Telehealth: Payer: Self-pay

## 2020-02-13 NOTE — Chronic Care Management (AMB) (Signed)
Chronic Care Management Pharmacy Assistant   Name: Dale Wood  MRN: [id number] DOB: 07/06/1950  Reason for Encounter: Disease State/ Hypertension Adherence Call    PCP : Vivi Barrack, MD  Allergies:   Allergies  Allergen Reactions  . Androgel [Testosterone]     *Pains in Chest/Axillary area*  . Penicillins     *Does Not Work*    Medications: Outpatient Encounter Medications as of 02/13/2020  Medication Sig  . acetaminophen (TYLENOL) 325 MG tablet Take 650 mg by mouth every 6 (six) hours as needed for moderate pain or headache.  . cyclobenzaprine (FLEXERIL) 10 MG tablet Take 1 tablet (10 mg total) by mouth 3 (three) times daily. (Patient taking differently: Take 10 mg by mouth 3 (three) times daily as needed for muscle spasms.)  . diclofenac (VOLTAREN) 75 MG EC tablet TAKE 1 TABLET(75 MG) BY MOUTH TWICE DAILY (Patient taking differently: Take 75 mg by mouth 2 (two) times daily.)  . famotidine (PEPCID) 20 MG tablet Take 1 tablet (20 mg total) by mouth 2 (two) times daily.  Marland Kitchen ibuprofen (ADVIL) 200 MG tablet Take 800 mg by mouth every 6 (six) hours as needed for mild pain or headache.  . levothyroxine (SYNTHROID) 50 MCG tablet Take 50 mcg by mouth daily.  Marland Kitchen losartan (COZAAR) 100 MG tablet TAKE 1 TABLET(100 MG) BY MOUTH DAILY  . Multiple Vitamins-Minerals (ONE-A-DAY MENS 50+) TABS Take 1 tablet by mouth daily.  . sildenafil (VIAGRA) 100 MG tablet Take 0.5-1 tablets (50-100 mg total) by mouth daily as needed for erectile dysfunction.   No facility-administered encounter medications on file as of 02/13/2020.    Current Diagnosis: Patient Active Problem List   Diagnosis Date Noted  . Subclinical hypothyroidism 09/09/2019  . Hyperglycemia 08/29/2019  . Low back pain with sciatica 07/09/2019  . Hyperlipidemia 01/05/2017  . Hypertension 01/05/2017  . ANKLE INJURY, RIGHT 09/22/2009  . ERECTILE DYSFUNCTION, ORGANIC 10/27/2008  . OSTEOARTHRITIS 10/27/2008  . ABDOMINAL PAIN,  EPIGASTRIC 10/27/2008  . SKIN CANCER, HX OF 10/27/2008  . NEPHROLITHIASIS, HX OF 10/27/2008  . BASAL CELL CARCINOMA, FACE 10/27/2008    Reviewed chart prior to disease state call. Spoke with patient regarding BP  Recent Office Vitals: BP Readings from Last 3 Encounters:  01/31/20 133/73  01/29/20 (!) 186/105  12/10/19 138/76   Pulse Readings from Last 3 Encounters:  01/31/20 85  01/29/20 95  12/10/19 77    Wt Readings from Last 3 Encounters:  01/31/20 246 lb (111.6 kg)  01/29/20 246 lb 14.6 oz (112 kg)  12/10/19 248 lb 6.4 oz (112.7 kg)     Kidney Function Lab Results  Component Value Date/Time   CREATININE 1.37 (H) 01/29/2020 12:08 AM   CREATININE 1.40 (H) 09/09/2019 03:55 PM   CREATININE 1.60 (H) 08/29/2019 03:39 PM   GFR 63.13 11/22/2016 04:48 PM   GFRNONAA 56 (L) 01/29/2020 12:08 AM   GFRAA 72 05/30/2018 02:30 PM    BMP Latest Ref Rng & Units 01/29/2020 09/09/2019 08/29/2019  Glucose 70 - 99 mg/dL 180(H) 92 139(H)  BUN 8 - 23 mg/dL 13 16 24   Creatinine 0.61 - 1.24 mg/dL 1.37(H) 1.40(H) 1.60(H)  BUN/Creat Ratio 6 - 22 (calc) - 11 15  Sodium 135 - 145 mmol/L 138 141 144  Potassium 3.5 - 5.1 mmol/L 4.2 4.3 4.4  Chloride 98 - 111 mmol/L 103 104 109  CO2 22 - 32 mmol/L 24 28 31   Calcium 8.9 - 10.3 mg/dL 9.1 9.0 9.5    .  Current antihypertensive regimen:  o Losartan 100 mg daily  . How often are you checking your Blood Pressure? 1-2x per week   . Current home BP readings: 120's/80's  . What recent interventions/DTPs have been made by any provider to improve Blood Pressure control since last CPP Visit: Patient states has been working on improving his diet. Patient states he has been taking his medications as directed.  . Any recent hospitalizations or ED visits since last visit with CPP? Yes , 01/29/2020 ED, Larene Pickett, PA-C Epigastric Pain, started famotidine 20 mg tablet twice daily, 02/04/2020 OV Vivi Barrack, MD, Cholelithiasis - Referred to general  surgeon.   . What diet changes have been made to improve Blood Pressure Control?  o Patient states since he has had issues with his gallbladder he has been avoiding fried/fatty foods and sweets.  . What exercise is being done to improve your Blood Pressure Control?  o Patient states he goes to the gym 2-3 times a week. He states he avoids doing exercises that puts pressure on his chest/abdomen due to his gallbladder pain.  Adherence Review: Is the patient currently on ACE/ARB medication? Yes Does the patient have >5 day gap between last estimated fill dates? No   April D Calhoun, [place] Pharmacist Assistant [phone]   Follow-Up:  Pharmacist Review

## 2020-02-14 ENCOUNTER — Telehealth: Payer: Self-pay

## 2020-02-14 MED ORDER — TRAMADOL HCL 50 MG PO TABS: 50.0000 mg | ORAL_TABLET | Freq: Three times a day (TID) | ORAL | 0 refills | Status: AC | PRN

## 2020-02-14 MED ORDER — ONDANSETRON 8 MG PO TBDP: 8.0000 mg | ORAL_TABLET | Freq: Three times a day (TID) | ORAL | 0 refills | Status: DC | PRN

## 2020-02-14 NOTE — Telephone Encounter (Signed)
Spoke with patient, stated pain lasting 15 min no nausea at this time  Pain scale #5  Patient want to know if he can take something for pain OTC

## 2020-02-14 NOTE — Telephone Encounter (Signed)
Rx sent in for tramadol and zofran. ER precautions reviewed.  [name]. [name], MD 02/14/2020 4:14 PM

## 2020-02-14 NOTE — Telephone Encounter (Signed)
See below

## 2020-02-14 NOTE — Telephone Encounter (Signed)
We can send in nausea and pain meds but if his symptoms have lasted for more than 3 hours he needs to go to the ED.  [name]. [name], MD 02/14/2020 4:03 PM

## 2020-02-14 NOTE — Telephone Encounter (Signed)
Pt is having a gallbladder attack and wanting to know if there is anything he can do to help with pain

## 2020-02-29 ENCOUNTER — Other Ambulatory Visit: Payer: Self-pay | Admitting: Family Medicine

## 2020-03-02 ENCOUNTER — Other Ambulatory Visit: Payer: Self-pay | Admitting: Family Medicine

## 2020-03-03 ENCOUNTER — Other Ambulatory Visit: Payer: Self-pay | Admitting: Surgery

## 2020-03-03 DIAGNOSIS — K429 Umbilical hernia without obstruction or gangrene: Secondary | ICD-10-CM | POA: Diagnosis not present

## 2020-03-03 DIAGNOSIS — K8020 Calculus of gallbladder without cholecystitis without obstruction: Secondary | ICD-10-CM | POA: Diagnosis not present

## 2020-03-12 ENCOUNTER — Telehealth: Payer: Self-pay

## 2020-03-12 NOTE — Telephone Encounter (Signed)
Left message to return call to our office at their convenience. Patient need OV for migraines

## 2020-03-12 NOTE — Telephone Encounter (Signed)
Patient is calling in stating that Dr.Blackwell prescribed some antibiotics that now have cased migraines, spoke with Dr.Blackwell and they changed the medication but still is having the migraines. Advised patient to follow up with the doctor who prescribed it.

## 2020-03-13 ENCOUNTER — Encounter: Payer: Self-pay | Admitting: Family Medicine

## 2020-03-13 ENCOUNTER — Other Ambulatory Visit (HOSPITAL_COMMUNITY): Payer: PPO

## 2020-03-13 ENCOUNTER — Telehealth (INDEPENDENT_AMBULATORY_CARE_PROVIDER_SITE_OTHER): Payer: PPO | Admitting: Family Medicine

## 2020-03-13 VITALS — Ht 72.0 in | Wt 244.0 lb

## 2020-03-13 DIAGNOSIS — R519 Headache, unspecified: Secondary | ICD-10-CM

## 2020-03-13 MED ORDER — PREDNISONE 50 MG PO TABS: ORAL_TABLET | ORAL | 0 refills | Status: DC

## 2020-03-13 NOTE — Addendum Note (Signed)
Addended by: Adah Salvage F on: 03/13/2020 04:04 PM   Modules accepted: Orders

## 2020-03-13 NOTE — Addendum Note (Signed)
Addended by: Vivi Barrack on: 03/13/2020 04:04 PM   Modules accepted: Orders

## 2020-03-13 NOTE — Progress Notes (Signed)
   Dale Wood is a 70 y.o. male who presents today for a virtual office visit.  Assessment/Plan:  Headache Discussed limitations of virtual visit.  Given new onset headache in his age in addition to pain distributed across bilateral temples there is some concern for temporal arteritis.  Potentially could just be side effect of Augmentin however we will empirically start prednisone while we await labs.  Check CBC, CMET, sed rate, and ESR.  If inflammatory markers are high will likely need referral for temporal artery biopsy.  Does not have any other red flag signs or symptoms.  Discussed strict ER precautions and return precautions.  Given that symptoms have been persistent for the past week not think we need to do any sort of urgent imaging at this time.    Subjective:  HPI:  Patient here with headache for the last week or so.  Started after he was starting on Augmentin for preoperative prophylaxis.  He will be undergoing cholecystectomy next week.  Pain is in his bilateral temples and the back of his head.  He has tried taking over-the-counter medications which is helped modestly but then come back after a few hours.  No vision changes.  No weakness or numbness.  No fevers or chills.  He does not typically get headaches.  Is never had anything like this before.       Objective/Observations  Physical Exam: Gen: NAD, resting comfortably Pulm: Normal work of breathing Neuro: Grossly normal, moves all extremities Psych: Normal affect and thought content  Virtual Visit via Video   I connected with Dale Wood on 03/13/20 at  3:40 PM EST by a video enabled telemedicine application and verified that I am speaking with the correct person using two identifiers. The limitations of evaluation and management by telemedicine and the availability of in person appointments were discussed. The patient expressed understanding and agreed to proceed.   Patient location: Home Provider location:  [place] participating in the virtual visit: Myself and Patient     [name]. [name], MD 03/13/2020 4:02 PM

## 2020-03-14 ENCOUNTER — Other Ambulatory Visit (HOSPITAL_COMMUNITY)
Admission: RE | Admit: 2020-03-14 | Discharge: 2020-03-14 | Disposition: A | Discharge status: Home / Self Care | Payer: PPO | Source: Ambulatory Visit | Attending: Surgery | Admitting: Surgery

## 2020-03-14 DIAGNOSIS — Z01812 Encounter for preprocedural laboratory examination: Secondary | ICD-10-CM | POA: Insufficient documentation

## 2020-03-14 DIAGNOSIS — Z20822 Contact with and (suspected) exposure to covid-19: Secondary | ICD-10-CM | POA: Insufficient documentation

## 2020-03-14 LAB — CBC
HCT: 43.9 % (ref 38.5–50.0)
Hemoglobin: 14.5 g/dL (ref 13.2–17.1)
MCH: 27.3 pg (ref 27.0–33.0)
MCHC: 33 g/dL (ref 32.0–36.0)
MCV: 82.5 fL (ref 80.0–100.0)
MPV: 9.9 fL (ref 7.5–12.5)
Platelets: 355 10*3/uL (ref 140–400)
RBC: 5.32 10*6/uL (ref 4.20–5.80)
RDW: 15.9 % — ABNORMAL HIGH (ref 11.0–15.0)
WBC: 6.9 10*3/uL (ref 3.8–10.8)

## 2020-03-14 LAB — COMPREHENSIVE METABOLIC PANEL
AG Ratio: 1.6 (calc) (ref 1.0–2.5)
ALT: 16 U/L (ref 9–46)
AST: 16 U/L (ref 10–35)
Albumin: 4.1 g/dL (ref 3.6–5.1)
Alkaline phosphatase (APISO): 76 U/L (ref 35–144)
BUN: 17 mg/dL (ref 7–25)
CO2: 29 mmol/L (ref 20–32)
Calcium: 9.3 mg/dL (ref 8.6–10.3)
Chloride: 104 mmol/L (ref 98–110)
Creat: 1.25 mg/dL (ref 0.70–1.25)
Globulin: 2.6 g/dL (calc) (ref 1.9–3.7)
Glucose, Bld: 98 mg/dL (ref 65–99)
Potassium: 4.4 mmol/L (ref 3.5–5.3)
Sodium: 141 mmol/L (ref 135–146)
Total Bilirubin: 0.4 mg/dL (ref 0.2–1.2)
Total Protein: 6.7 g/dL (ref 6.1–8.1)

## 2020-03-14 LAB — SARS CORONAVIRUS 2 (TAT 6-24 HRS): SARS Coronavirus 2: NEGATIVE

## 2020-03-14 LAB — HIGH SENSITIVITY CRP: hs-CRP: 2.1 mg/L

## 2020-03-14 LAB — SEDIMENTATION RATE: Sed Rate: 6 mm/h (ref 0–20)

## 2020-03-16 NOTE — Progress Notes (Signed)
Please inform patient of the following:  Labs are all NORMAL.  Would like for him to let us know if headache has stopped. He can stop the prednisone if the headaches have went away.  Dale Wood. [name], MD 03/16/2020 9:50 AM

## 2020-03-17 ENCOUNTER — Other Ambulatory Visit: Payer: Self-pay

## 2020-03-17 ENCOUNTER — Encounter (HOSPITAL_COMMUNITY): Payer: Self-pay | Admitting: Surgery

## 2020-03-17 NOTE — Progress Notes (Signed)
Dale Wood denies chest pain or shortness of breath.  . Patient tested negative for Covid 03/13/2020 and has been in quarantine since that time.  Dale Wood has had a headache since starting antibiotic Augmentin.  Patient was seen by his PCP 03/16/20, Dr [name] order Prednisone and changed antibiotic to Cipro. Dale Wood took first dose of Prednisone this am.

## 2020-03-18 ENCOUNTER — Ambulatory Visit (HOSPITAL_COMMUNITY)
Admission: RE | Admit: 2020-03-18 | Discharge: 2020-03-18 | Disposition: A | Discharge status: Home / Self Care | Payer: PPO | Attending: Surgery | Admitting: Surgery

## 2020-03-18 ENCOUNTER — Ambulatory Visit (HOSPITAL_COMMUNITY): Payer: PPO | Admitting: Anesthesiology

## 2020-03-18 ENCOUNTER — Encounter (HOSPITAL_COMMUNITY): Payer: Self-pay | Admitting: Surgery

## 2020-03-18 ENCOUNTER — Other Ambulatory Visit: Payer: Self-pay

## 2020-03-18 ENCOUNTER — Encounter (HOSPITAL_COMMUNITY)
Admission: RE | Disposition: A | Discharge status: Home / Self Care | Payer: Self-pay | Source: Home / Self Care | Attending: Surgery

## 2020-03-18 DIAGNOSIS — Z79890 Hormone replacement therapy (postmenopausal): Secondary | ICD-10-CM | POA: Diagnosis not present

## 2020-03-18 DIAGNOSIS — Z79899 Other long term (current) drug therapy: Secondary | ICD-10-CM | POA: Insufficient documentation

## 2020-03-18 DIAGNOSIS — Z87891 Personal history of nicotine dependence: Secondary | ICD-10-CM | POA: Insufficient documentation

## 2020-03-18 DIAGNOSIS — K8020 Calculus of gallbladder without cholecystitis without obstruction: Secondary | ICD-10-CM | POA: Diagnosis not present

## 2020-03-18 DIAGNOSIS — E785 Hyperlipidemia, unspecified: Secondary | ICD-10-CM | POA: Diagnosis not present

## 2020-03-18 DIAGNOSIS — K8010 Calculus of gallbladder with chronic cholecystitis without obstruction: Secondary | ICD-10-CM | POA: Diagnosis not present

## 2020-03-18 DIAGNOSIS — K429 Umbilical hernia without obstruction or gangrene: Secondary | ICD-10-CM | POA: Diagnosis not present

## 2020-03-18 DIAGNOSIS — E039 Hypothyroidism, unspecified: Secondary | ICD-10-CM | POA: Diagnosis not present

## 2020-03-18 HISTORY — DX: Personal history of urinary calculi: Z87.442

## 2020-03-18 HISTORY — PX: UMBILICAL HERNIA REPAIR: SHX196

## 2020-03-18 HISTORY — DX: Hypothyroidism, unspecified: E03.9

## 2020-03-18 HISTORY — DX: Respiratory tuberculosis unspecified: A15.9

## 2020-03-18 HISTORY — PX: CHOLECYSTECTOMY: SHX55

## 2020-03-18 SURGERY — LAPAROSCOPIC CHOLECYSTECTOMY
Anesthesia: General

## 2020-03-18 MED ORDER — LACTATED RINGERS IV SOLN: INTRAVENOUS | Status: DC

## 2020-03-18 MED ORDER — 0.9 % SODIUM CHLORIDE (POUR BTL) OPTIME
TOPICAL | Status: DC | PRN
  Administered 2020-03-18: 1000 mL

## 2020-03-18 MED ORDER — FENTANYL CITRATE (PF) 250 MCG/5ML IJ SOLN
INTRAMUSCULAR | Status: DC | PRN
  Administered 2020-03-18 (×2): 50 ug via INTRAVENOUS
  Administered 2020-03-18: 100 ug via INTRAVENOUS
  Administered 2020-03-18: 50 ug via INTRAVENOUS

## 2020-03-18 MED ORDER — ONDANSETRON HCL 4 MG/2ML IJ SOLN
INTRAMUSCULAR | Status: AC
  Filled 2020-03-18: qty 2

## 2020-03-18 MED ORDER — ORAL CARE MOUTH RINSE: 15.0000 mL | Freq: Once | OROMUCOSAL | Status: AC

## 2020-03-18 MED ORDER — SODIUM CHLORIDE 0.9 % IR SOLN
Status: DC | PRN
  Administered 2020-03-18: 1000 mL

## 2020-03-18 MED ORDER — DEXAMETHASONE SODIUM PHOSPHATE 10 MG/ML IJ SOLN
INTRAMUSCULAR | Status: AC
  Filled 2020-03-18: qty 1

## 2020-03-18 MED ORDER — ROCURONIUM BROMIDE 10 MG/ML (PF) SYRINGE
PREFILLED_SYRINGE | INTRAVENOUS | Status: DC | PRN
  Administered 2020-03-18: 50 mg via INTRAVENOUS

## 2020-03-18 MED ORDER — CIPROFLOXACIN IN D5W 400 MG/200ML IV SOLN
400.0000 mg | INTRAVENOUS | Status: AC
  Administered 2020-03-18: 400 mg via INTRAVENOUS
  Filled 2020-03-18: qty 200

## 2020-03-18 MED ORDER — FENTANYL CITRATE (PF) 100 MCG/2ML IJ SOLN
INTRAMUSCULAR | Status: AC
  Filled 2020-03-18: qty 2

## 2020-03-18 MED ORDER — BUPIVACAINE HCL (PF) 0.25 % IJ SOLN
INTRAMUSCULAR | Status: DC | PRN
Start: 2020-03-18 — End: 2020-03-18
  Administered 2020-03-18: 20 mL

## 2020-03-18 MED ORDER — HYDRALAZINE HCL 20 MG/ML IJ SOLN
INTRAMUSCULAR | Status: AC
  Filled 2020-03-18: qty 1

## 2020-03-18 MED ORDER — ACETAMINOPHEN 500 MG PO TABS
1000.0000 mg | ORAL_TABLET | ORAL | Status: AC
  Administered 2020-03-18: 1000 mg via ORAL
  Filled 2020-03-18: qty 2

## 2020-03-18 MED ORDER — FENTANYL CITRATE (PF) 100 MCG/2ML IJ SOLN
25.0000 ug | INTRAMUSCULAR | Status: DC | PRN
  Administered 2020-03-18: 25 ug via INTRAVENOUS
  Administered 2020-03-18 (×2): 50 ug via INTRAVENOUS

## 2020-03-18 MED ORDER — OXYCODONE HCL 5 MG PO TABS: 5.0000 mg | ORAL_TABLET | Freq: Four times a day (QID) | ORAL | 0 refills | Status: DC | PRN

## 2020-03-18 MED ORDER — OXYCODONE HCL 5 MG/5ML PO SOLN: 5.0000 mg | Freq: Once | ORAL | Status: DC | PRN

## 2020-03-18 MED ORDER — CHLORHEXIDINE GLUCONATE 0.12 % MT SOLN
15.0000 mL | Freq: Once | OROMUCOSAL | Status: AC
  Administered 2020-03-18: 15 mL via OROMUCOSAL
  Filled 2020-03-18: qty 15

## 2020-03-18 MED ORDER — PROPOFOL 10 MG/ML IV BOLUS
INTRAVENOUS | Status: DC | PRN
  Administered 2020-03-18: 150 mg via INTRAVENOUS
  Administered 2020-03-18: 50 mg via INTRAVENOUS

## 2020-03-18 MED ORDER — HYDROMORPHONE HCL 1 MG/ML IJ SOLN
INTRAMUSCULAR | Status: AC
  Filled 2020-03-18: qty 0.5

## 2020-03-18 MED ORDER — DEXMEDETOMIDINE (PRECEDEX) IN NS 20 MCG/5ML (4 MCG/ML) IV SYRINGE
PREFILLED_SYRINGE | INTRAVENOUS | Status: DC | PRN
  Administered 2020-03-18 (×2): 8 ug via INTRAVENOUS
  Administered 2020-03-18: 4 ug via INTRAVENOUS

## 2020-03-18 MED ORDER — OXYCODONE HCL 5 MG PO TABS: 5.0000 mg | ORAL_TABLET | Freq: Once | ORAL | Status: AC

## 2020-03-18 MED ORDER — LIDOCAINE 2% (20 MG/ML) 5 ML SYRINGE
INTRAMUSCULAR | Status: DC | PRN
  Administered 2020-03-18: 60 mg via INTRAVENOUS

## 2020-03-18 MED ORDER — FENTANYL CITRATE (PF) 250 MCG/5ML IJ SOLN
INTRAMUSCULAR | Status: AC
  Filled 2020-03-18: qty 5

## 2020-03-18 MED ORDER — ONDANSETRON HCL 4 MG/2ML IJ SOLN
4.0000 mg | Freq: Once | INTRAMUSCULAR | Status: AC | PRN
  Administered 2020-03-18: 4 mg via INTRAVENOUS

## 2020-03-18 MED ORDER — OXYCODONE HCL 5 MG PO TABS: 5.0000 mg | ORAL_TABLET | Freq: Once | ORAL | Status: DC | PRN

## 2020-03-18 MED ORDER — PROPOFOL 10 MG/ML IV BOLUS
INTRAVENOUS | Status: AC
  Filled 2020-03-18: qty 20

## 2020-03-18 MED ORDER — ROCURONIUM BROMIDE 10 MG/ML (PF) SYRINGE
PREFILLED_SYRINGE | INTRAVENOUS | Status: AC
  Filled 2020-03-18: qty 10

## 2020-03-18 MED ORDER — AMISULPRIDE (ANTIEMETIC) 5 MG/2ML IV SOLN: 10.0000 mg | Freq: Once | INTRAVENOUS | Status: AC

## 2020-03-18 MED ORDER — SUGAMMADEX SODIUM 500 MG/5ML IV SOLN
INTRAVENOUS | Status: AC
  Filled 2020-03-18: qty 5

## 2020-03-18 MED ORDER — HYDROMORPHONE HCL 1 MG/ML IJ SOLN
INTRAMUSCULAR | Status: DC | PRN
  Administered 2020-03-18 (×2): .5 mg via INTRAVENOUS

## 2020-03-18 MED ORDER — LIDOCAINE 2% (20 MG/ML) 5 ML SYRINGE
INTRAMUSCULAR | Status: AC
  Filled 2020-03-18: qty 5

## 2020-03-18 MED ORDER — DEXAMETHASONE SODIUM PHOSPHATE 10 MG/ML IJ SOLN
INTRAMUSCULAR | Status: DC | PRN
  Administered 2020-03-18: 4 mg via INTRAVENOUS

## 2020-03-18 MED ORDER — CHLORHEXIDINE GLUCONATE CLOTH 2 % EX PADS: 6.0000 | MEDICATED_PAD | Freq: Once | CUTANEOUS | Status: DC

## 2020-03-18 MED ORDER — LABETALOL HCL 5 MG/ML IV SOLN
INTRAVENOUS | Status: AC
  Filled 2020-03-18: qty 4

## 2020-03-18 MED ORDER — AMISULPRIDE (ANTIEMETIC) 5 MG/2ML IV SOLN
INTRAVENOUS | Status: AC
  Administered 2020-03-18: 10 mg via INTRAVENOUS
  Filled 2020-03-18: qty 4

## 2020-03-18 MED ORDER — SUGAMMADEX SODIUM 200 MG/2ML IV SOLN
INTRAVENOUS | Status: DC | PRN
  Administered 2020-03-18 (×2): 200 mg via INTRAVENOUS

## 2020-03-18 MED ORDER — HYDRALAZINE HCL 20 MG/ML IJ SOLN
10.0000 mg | INTRAMUSCULAR | Status: DC | PRN
Start: 2020-03-18 — End: 2020-03-19
  Administered 2020-03-18: 10 mg via INTRAVENOUS

## 2020-03-18 MED ORDER — LABETALOL HCL 5 MG/ML IV SOLN
INTRAVENOUS | Status: DC | PRN
  Administered 2020-03-18 (×2): 5 mg via INTRAVENOUS

## 2020-03-18 MED ORDER — OXYCODONE HCL 5 MG PO TABS
ORAL_TABLET | ORAL | Status: AC
  Administered 2020-03-18: 5 mg via ORAL
  Filled 2020-03-18: qty 1

## 2020-03-18 MED ORDER — MIDAZOLAM HCL 2 MG/2ML IJ SOLN
INTRAMUSCULAR | Status: AC
  Filled 2020-03-18: qty 2

## 2020-03-18 SURGICAL SUPPLY — 44 items
APPLIER CLIP 5 13 M/L LIGAMAX5 (MISCELLANEOUS) ×2
BLADE CLIPPER SURG (BLADE) ×2 IMPLANT
CANISTER SUCT 3000ML PPV (MISCELLANEOUS) ×2 IMPLANT
CHLORAPREP W/TINT 26 (MISCELLANEOUS) ×2 IMPLANT
CLIP APPLIE 5 13 M/L LIGAMAX5 (MISCELLANEOUS) ×1 IMPLANT
COVER SURGICAL LIGHT HANDLE (MISCELLANEOUS) ×2 IMPLANT
COVER WAND RF STERILE (DRAPES) ×2 IMPLANT
DERMABOND ADHESIVE PROPEN (GAUZE/BANDAGES/DRESSINGS) ×1
DERMABOND ADVANCED (GAUZE/BANDAGES/DRESSINGS) ×1
DERMABOND ADVANCED .7 DNX12 (GAUZE/BANDAGES/DRESSINGS) ×1 IMPLANT
DERMABOND ADVANCED .7 DNX6 (GAUZE/BANDAGES/DRESSINGS) ×1 IMPLANT
DRAPE LAPAROTOMY 100X72 PEDS (DRAPES) ×2 IMPLANT
ELECT REM PT RETURN 9FT ADLT (ELECTROSURGICAL) ×2
ELECTRODE REM PT RTRN 9FT ADLT (ELECTROSURGICAL) ×1 IMPLANT
GLOVE SURG SIGNA 7.5 PF LTX (GLOVE) ×2 IMPLANT
GOWN STRL REUS W/ TWL LRG LVL3 (GOWN DISPOSABLE) ×2 IMPLANT
GOWN STRL REUS W/ TWL XL LVL3 (GOWN DISPOSABLE) ×1 IMPLANT
GOWN STRL REUS W/TWL LRG LVL3 (GOWN DISPOSABLE) ×4
GOWN STRL REUS W/TWL XL LVL3 (GOWN DISPOSABLE) ×2
KIT BASIN OR (CUSTOM PROCEDURE TRAY) ×2 IMPLANT
KIT TURNOVER KIT B (KITS) ×2 IMPLANT
NEEDLE HYPO 25GX1X1/2 BEV (NEEDLE) ×2 IMPLANT
NS IRRIG 1000ML POUR BTL (IV SOLUTION) ×2 IMPLANT
PACK GENERAL/GYN (CUSTOM PROCEDURE TRAY) ×2 IMPLANT
PAD ARMBOARD 7.5X6 YLW CONV (MISCELLANEOUS) ×2 IMPLANT
PENCIL SMOKE EVACUATOR (MISCELLANEOUS) ×2 IMPLANT
POUCH SPECIMEN RETRIEVAL 10MM (ENDOMECHANICALS) ×2 IMPLANT
SCISSORS LAP 5X35 DISP (ENDOMECHANICALS) ×2 IMPLANT
SET IRRIG TUBING LAPAROSCOPIC (IRRIGATION / IRRIGATOR) ×2 IMPLANT
SET TUBE SMOKE EVAC HIGH FLOW (TUBING) ×2 IMPLANT
SLEEVE ENDOPATH XCEL 5M (ENDOMECHANICALS) ×4 IMPLANT
SPECIMEN JAR SMALL (MISCELLANEOUS) ×2 IMPLANT
SUT ETHIBOND NAB CT1 #1 30IN (SUTURE) ×2 IMPLANT
SUT MNCRL AB 4-0 PS2 18 (SUTURE) ×2 IMPLANT
SUT NOVA NAB DX-16 0-1 5-0 T12 (SUTURE) IMPLANT
SUT VIC AB 3-0 SH 27 (SUTURE) ×2
SUT VIC AB 3-0 SH 27X BRD (SUTURE) ×1 IMPLANT
SYR CONTROL 10ML LL (SYRINGE) ×2 IMPLANT
TOWEL GREEN STERILE (TOWEL DISPOSABLE) ×2 IMPLANT
TOWEL GREEN STERILE FF (TOWEL DISPOSABLE) ×2 IMPLANT
TRAY LAPAROSCOPIC MC (CUSTOM PROCEDURE TRAY) ×2 IMPLANT
TROCAR XCEL BLUNT TIP 100MML (ENDOMECHANICALS) ×2 IMPLANT
TROCAR XCEL NON-BLD 5MMX100MML (ENDOMECHANICALS) ×2 IMPLANT
WATER STERILE IRR 1000ML POUR (IV SOLUTION) ×2 IMPLANT

## 2020-03-18 NOTE — Op Note (Signed)
LAPAROSCOPIC CHOLECYSTECTOMY, HERNIA REPAIR UMBILICAL ADULT  Procedure Note  Dale Wood [facility] 03/18/2020   Pre-op Diagnosis: SYMPTOMATIC GALLSTONES, UMBILICAL HERNIA     Post-op Diagnosis: same  Procedure(s): LAPAROSCOPIC CHOLECYSTECTOMY HERNIA REPAIR UMBILICAL ADULT  Surgeon(s): Coralie Keens, MD  Anesthesia: General  Staff:  Circulator: Philomena Doheny, RN Scrub Person: Lovett Sox, CST RN First Assistant: Alena Bills, RN  Estimated Blood Loss: Minimal               Findings: The patient evaluated chronic inflamed gallbladder with gallstones.  He was also found to have an umbilical hernia which was repaired primarily  Procedure: Patient brought to operating identifies correct patient.  He is placed upon the operating table general anesthesia was induced.  His abdomen was then prepped and draped in usual sterile fashion.  I made a small transverse incision above the umbilicus with a scalpel.  I then carried this down to the fascial defect of the umbilical hernia with the cautery.  I then excised the sac.  I then grasped the fascia with Coker clamps.  I then used this opening for my umbilical trocar.  I placed a 0 Vicryl pursing suture around the fascial opening.  I then passed the [facility] port through the opening and into the abdominal cavity.  Insufflation of the abdomen was then begun. The 5 mm trocar the patient epigastrium and 2 more in the right upper quadrant all under direct vision.  The gallbladder is found covered with omentum with omentum also stuck to the abdominal wall.  Adhesions were taken down with the laparoscopic scissors.  I was then able to pull the omentum off the inflamed gallbladder.  I grasped the gallbladder retracted above the liver bed.  I had to aspirate bile from the gallbladder in order to facilitate grasping better.  I was then able to retract it better.  The cystic duct and cystic artery were easily dissected out and a critical window was  achieved around both.  Both the cystic duct and cystic artery were clipped 3 times proximally once distally and transected.  The gallbladder was then slowly dissected free from the liver bed with electrocautery.  Once it was free from liver bed hemostasis was achieved with the cautery.  The gallbladder was then placed in an Endosac and removed through the incision at the umbilicus.  0 Vicryl the umbilicus tied in place closing the fascial defect.  I then copiously irrigated the abdomen with normal saline.  Hemostasis again appeared to be achieved.  All ports removed under direct vision and the abdomen was deflated.  I then used a #1 Ethibond suture to close the umbilical fascia further in a figure-of-eight fashion.  All incisions were anesthetized with Marcaine and closed with 4-0 nylon sutures.  Dermabond was then applied.  The patient tolerated procedure well.  All the counts were correct at the end of the procedure.  The patient was then extubated in the operating room and taken in stable addition to the recovery room.          Coralie Keens   Date: 03/18/2020  Time: 4:12 PM

## 2020-03-18 NOTE — Anesthesia Procedure Notes (Signed)
Procedure Name: Intubation Date/Time: 03/18/2020 3:20 PM Performed by: Colin Benton, CRNA Pre-anesthesia Checklist: Patient identified, Emergency Drugs available, Suction available and Patient being monitored Patient Re-evaluated:Patient Re-evaluated prior to induction Oxygen Delivery Method: Circle system utilized Preoxygenation: Pre-oxygenation with 100% oxygen Induction Type: IV induction Ventilation: Mask ventilation without difficulty and Oral airway inserted - appropriate to patient size Laryngoscope Size: Mac and 3 Grade View: Grade II Tube type: Oral Tube size: 7.5 mm Number of attempts: 1 Airway Equipment and Method: Stylet and Oral airway Placement Confirmation: ETT inserted through vocal cords under direct vision,  positive ETCO2 and breath sounds checked- equal and bilateral Secured at: 23 cm Tube secured with: Tape Dental Injury: Teeth and Oropharynx as per pre-operative assessment

## 2020-03-18 NOTE — H&P (Signed)
Dale Wood Appointment: 03/03/2020 3:50 PM Location: [facility] Patient #: [id number] DOB: 04-11-1950 Married / Language: Dale Wood / Race: White Male   History of Present Illness (Dale Wood A. Ninfa Linden MD; 03/03/2020 4:06 PM) The patient is a 70 year old male who presents for evaluation of gall stones.  Chief complaint: Epigastric and right upper quadrant, pain, gallstones  This is a 70 year old gentleman who presented to the emergency department on January 12 with epigastric abdominal pain, nausea, vomiting. At that time, he had elevated white blood count and his bilirubin was 1.4. He had a CT scan showing gallstones. He was discharged home from the emergency department. He has since seen his primary care physician is on pain medication. He reports he is still having discomfort daily but it was less than before. He no longer has nausea or vomiting. He denies jaundice. He is otherwise without complaints. His cardiac workup at the time of the visit to the emergency department was unremarkable.   Allergies ([name], CMA; 03/03/2020 3:41 PM) No Known Drug Allergies  [03/03/2020]: No Known Allergies  [03/03/2020]: Allergies Reconciled   Medication History ([name], CMA; 03/03/2020 3:42 PM) traMADol HCl (50MG  Tablet, Oral) Active. Cyclobenzaprine HCl (10MG  Tablet, Oral) Active. Ondansetron (8MG  Tablet Disint, Oral) Active. Losartan Potassium (100MG  Tablet, Oral) Active. Levothyroxine Sodium (50MCG Tablet, Oral) Active. Famotidine (20MG  Tablet, Oral) Active. Diclofenac Sodium (75MG  Tablet DR, Oral) Active. Gabapentin (300MG  Capsule, Oral) Active. Medications Reconciled  Social History [name], CMA; 03/03/2020 3:41 PM) Tobacco use  Former smoker.  Other Problems [name], CMA; 03/03/2020 3:41 PM) Hypercholesterolemia  Kidney Stone     Review of Systems [name] CMA;  03/03/2020 3:41 PM) General Not Present- Appetite Loss, Chills, Fatigue, Fever, Night Sweats, Weight Gain and Weight Loss. Skin Not Present- Change in Wart/Mole, Dryness, Hives, Jaundice, New Lesions, Non-Healing Wounds, Rash and Ulcer. Respiratory Not Present- Bloody sputum, Chronic Cough, Difficulty Breathing, Snoring and Wheezing.  Vitals ([name] CMA; 03/03/2020 3:43 PM) 03/03/2020 3:42 PM Weight: 250.38 lb Height: 72in Body Surface Area: 2.34 m Body Mass Index: 33.96 kg/m  Temp.: 15F  Pulse: 88 (Regular)  P.OX: 93% (Room air) BP: 136/84(Sitting, Left Arm, Standard)       Physical Exam (Dale Wood A. Ninfa Linden MD; 03/03/2020 4:06 PM) The physical exam findings are as follows: Note: On exam today, he appears comfortable  His abdomen is soft and obese. There is some tenderness with guarding in the right upper quadrant. There is no hepatomegaly.  He has a moderate sized, easily reducible umbilical hernia    Assessment & Plan (Dale Wood A. Ninfa Linden MD; 03/03/2020 4:08 PM)  SYMPTOMATIC CHOLELITHIASIS (Z02.58) UMBILICAL HERNIA (N27.7)  Impression: I reviewed his notes in the electronic medical records. He has symptomatic cholelithiasis and I suspect some chronic cholecystitis based on his symptoms, exam, and laboratory data. I discussed this with him in detail. A laparoscopic cholecystectomy as well as umbilical hernia repair is recommended. I would not be of use mesh with the umbilical hernia for risk of contamination of the mesh secondary to the cholecystitis. I discussed the procedure in detail. The patient was given Neurosurgeon. We discussed the risks and benefits of a laparoscopic cholecystectomy and possible cholangiogram including, but not limited to, bleeding, infection, injury to surrounding structures such as the intestine or liver, bile leak, retained gallstones, need to convert to an open procedure, prolonged diarrhea, blood clots such as DVT,  common bile duct injury, anesthesia risks, and possible need for additional procedures. The  likelihood of improvement in symptoms and return to the patient's normal status is good. We discussed the typical post-operative recovery course. All questions were answered. We also discussed the postoperative recovery which would be at least 4 weeks and no lifting giving the umbilical hernia repair. We also discussed the risk of recurrent hernia without the ability to use mesh.

## 2020-03-18 NOTE — Progress Notes (Signed)
Spoke with Dr Marcie Bal regarding patient saturations patient to drop to 86% when drifting off to sleep patient encouraged to incentive spirometer saturations have improved while awake saturations last 4mins saturation have been 91-97% RA

## 2020-03-18 NOTE — Discharge Instructions (Addendum)
[facility] ______[facility] LAPAROSCOPIC SURGERY: POST OP INSTRUCTIONS Always review your discharge instruction sheet given to you by the facility where your surgery was performed. IF YOU HAVE DISABILITY OR FAMILY LEAVE FORMS, YOU MUST BRING THEM TO THE OFFICE FOR PROCESSING.   DO NOT GIVE THEM TO YOUR DOCTOR.  1. A prescription for pain medication may be given to you upon discharge.  Take your pain medication as prescribed, if needed.  If narcotic pain medicine is not needed, then you may take acetaminophen (Tylenol) or ibuprofen (Advil) as needed. 2. Take your usually prescribed medications unless otherwise directed. 3. If you need a refill on your pain medication, please contact your pharmacy.  They will contact our office to request authorization. Prescriptions will not be filled after 5pm or on week-ends. 4. You should follow a light diet the first few days after arrival home, such as soup and crackers, etc.  Be sure to include lots of fluids daily. 5. Most patients will experience some swelling and bruising in the area of the incisions.  Ice packs will help.  Swelling and bruising can take several days to resolve.  6. It is common to experience some constipation if taking pain medication after surgery.  Increasing fluid intake and taking a stool softener (such as Colace) will usually help or prevent this problem from occurring.  A mild laxative (Milk of Magnesia or Miralax) should be taken according to package instructions if there are no bowel movements after 48 hours. 7. Unless discharge instructions indicate otherwise, you may remove your bandages 24-48 hours after surgery, and you may shower at that time.  You may have steri-strips (small skin tapes) in place directly over the incision.  These strips should be left on the skin for 7-10 days.  If your surgeon used skin glue on the incision, you may shower in 24 hours.  The glue will flake off over the next 2-3 weeks.  Any sutures or  staples will be removed at the office during your follow-up visit. 8. ACTIVITIES:  You may resume regular (light) daily activities beginning the next day--such as daily self-care, walking, climbing stairs--gradually increasing activities as tolerated.  You may have sexual intercourse when it is comfortable.  Refrain from any heavy lifting or straining until approved by your doctor. a. You may drive when you are no longer taking prescription pain medication, you can comfortably wear a seatbelt, and you can safely maneuver your car and apply brakes. b. RETURN TO WORK:  __________________________________________________________ 9. You should see your doctor in the office for a follow-up appointment approximately 2-3 weeks after your surgery.  Make sure that you call for this appointment within a day or two after you arrive home to insure a convenient appointment time. 10. OTHER INSTRUCTIONS:OK TO SHOWER STARTING TOMORROW 11. ICE PACK, TYLENOL, AND IBUPROFEN ALSO FOR PAIN 12. NO LIFTING MORE THAN 15 TO 20 POUNDS FOR 4 WEEKS __________________________________________________________________________________________________________________________ __________________________________________________________________________________________________________________________ WHEN TO CALL YOUR DOCTOR: 1. Fever over 101.0 2. Inability to urinate 3. Continued bleeding from incision. 4. Increased pain, redness, or drainage from the incision. 5. Increasing abdominal pain  The clinic staff is available to answer your questions during regular business hours.  Please don't hesitate to call and ask to speak to one of the nurses for clinical concerns.  If you have a medical emergency, go to the nearest emergency room or call 911.  A surgeon from [facility] is always on call at the hospital. [address],   [address], [place], [place]  [address] ? [address] [address], [place], [place]   [address] °[phone] ?  [phone] ? FAX [phone] °Web site: [email or web address] °

## 2020-03-18 NOTE — Transfer of Care (Signed)
Immediate Anesthesia Transfer of Care Note  Patient: Dale Wood  Procedure(s) Performed: LAPAROSCOPIC CHOLECYSTECTOMY (N/A ) HERNIA REPAIR UMBILICAL ADULT (N/A )  Patient Location: PACU  Anesthesia Type:General  Level of Consciousness: awake and alert   Airway & Oxygen Therapy: Patient Spontanous Breathing and Patient connected to face mask oxygen  Post-op Assessment: Report given to RN and Post -op Vital signs reviewed and stable  Post vital signs: Reviewed and stable  Last Vitals:  Vitals Value Taken Time  BP 221/136 03/18/20 1615  Temp    Pulse    Resp 18 03/18/20 1617  SpO2 95 % 03/18/20 1617  Vitals shown include unvalidated device data.  Last Pain:  Vitals:   03/18/20 1306  PainSc: 5          Complications: No complications documented.

## 2020-03-18 NOTE — Interval H&P Note (Signed)
History and Physical Interval Note: no change in H and P  03/18/2020 1:50 PM  Dale Wood  has presented today for surgery, with the diagnosis of SYMPTOMATIC GALLSTONES, UMBILICAL HERNIA.  The various methods of treatment have been discussed with the patient and family. After consideration of risks, benefits and other options for treatment, the patient has consented to  Procedure(s) with comments: LAPAROSCOPIC CHOLECYSTECTOMY (N/A) - 60 MIN TOTAL - ROOM 10 HERNIA REPAIR UMBILICAL ADULT (N/A) as a surgical intervention.  The patient's history has been reviewed, patient examined, no change in status, stable for surgery.  I have reviewed the patient's chart and labs.  Questions were answered to the patient's satisfaction.     Coralie Keens

## 2020-03-18 NOTE — Anesthesia Preprocedure Evaluation (Addendum)
Anesthesia Evaluation  Patient identified by MRN, date of birth, ID band Patient awake    Reviewed: Allergy & Precautions, NPO status , Patient's Chart, lab work & pertinent test results  History of Anesthesia Complications Negative for: history of anesthetic complications  Airway Mallampati: II  TM Distance: >3 FB Neck ROM: Full    Dental  (+) Dental Advisory Given, Teeth Intact   Pulmonary former smoker,    Pulmonary exam normal        Cardiovascular hypertension, Pt. on medications Normal cardiovascular exam   '20 TTE - EF 60-65%. Mildly increased left ventricular wall thickness. Sclerosis without any evidence of stenosis of the aortic valve.   '20 Stress Test - There was no ST segment deviation noted during stress. The patient walked for 5:40 of a Bruce protocol GXT. He achieved a peak HR of 122 which is 79% predicted maximal HR At peak exercise, there were no ST or T wave changes to suggest ischemia . Blood pressure demonstrated a hypertensive response to exercise. This is interpreted as a nondiagnositic GXT due to inability to achieve target HR .   Neuro/Psych  Headaches, negative psych ROS   GI/Hepatic Neg liver ROS, GERD  Controlled,  Endo/Other  Hypothyroidism  Obesity   Renal/GU Renal InsufficiencyRenal disease     Musculoskeletal  (+) Arthritis ,   Abdominal   Peds  Hematology negative hematology ROS (+)   Anesthesia Other Findings Covid test negative   Reproductive/Obstetrics                           Anesthesia Physical Anesthesia Plan  ASA: II  Anesthesia Plan: General   Post-op Pain Management:    Induction: Intravenous  PONV Risk Score and Plan: 2 and Treatment may vary due to age or medical condition, Ondansetron and Dexamethasone  Airway Management Planned: Oral ETT  Additional Equipment: None  Intra-op Plan:   Post-operative Plan: Extubation in  OR  Informed Consent: I have reviewed the patients History and Physical, chart, labs and discussed the procedure including the risks, benefits and alternatives for the proposed anesthesia with the patient or authorized representative who has indicated his/her understanding and acceptance.     Dental advisory given  Plan Discussed with: CRNA and Anesthesiologist  Anesthesia Plan Comments: (  )      Anesthesia Quick Evaluation

## 2020-03-18 NOTE — Progress Notes (Signed)
Patient discharged at 2200 reports little nausea and pain is tolerable no further vomiting noted

## 2020-03-19 ENCOUNTER — Encounter (HOSPITAL_COMMUNITY): Payer: Self-pay | Admitting: Surgery

## 2020-03-19 NOTE — Anesthesia Postprocedure Evaluation (Signed)
Anesthesia Post Note  Patient: Dale Wood  Procedure(s) Performed: LAPAROSCOPIC CHOLECYSTECTOMY (N/A ) HERNIA REPAIR UMBILICAL ADULT (N/A )     Patient location during evaluation: PACU Anesthesia Type: General Level of consciousness: awake and alert Pain management: pain level controlled Vital Signs Assessment: post-procedure vital signs reviewed and stable Respiratory status: spontaneous breathing, nonlabored ventilation, respiratory function stable and patient connected to nasal cannula oxygen Cardiovascular status: blood pressure returned to baseline and stable Postop Assessment: no apparent nausea or vomiting Anesthetic complications: no   No complications documented.  Last Vitals:  Vitals:   03/18/20 1935 03/18/20 2145  BP: (!) 157/91 (!) 159/83  Pulse:    Resp: 18 14  Temp: (!) 36.1 C (!) 36.1 C  SpO2: 95% 95%    Last Pain:  Vitals:   03/18/20 2145  PainSc: [name]

## 2020-03-20 ENCOUNTER — Telehealth: Payer: Self-pay

## 2020-03-20 LAB — SURGICAL PATHOLOGY

## 2020-03-20 NOTE — Telephone Encounter (Signed)
Transition Care Management Follow-up Telephone Call  Date of discharge and from where: [facility] 03/18/20  How have you been since you were released from the hospital? [facility]  Any questions or concerns? No  Items Reviewed:  Did the pt receive and understand the discharge instructions provided? Yes   Medications obtained and verified? Yes   Other? No   Any new allergies since your discharge? No   Dietary orders reviewed? Yes  Do you have support at home? Yes   Home Care and Equipment/Supplies: Were home health services ordered? not applicable If so, what is the name of the agency?   Has the agency set up a time to come to the patient's home? not applicable Were any new equipment or medical supplies ordered?  No What is the name of the medical supply agency?  Were you able to get the supplies/equipment? not applicable Do you have any questions related to the use of the equipment or supplies? No  Functional Questionnaire: (I = Independent and D = Dependent) ADLs: I unable to lay flat at this time   Bathing/Dressing- I  Meal Prep- D   Eating- I  Maintaining continence- i  Transferring/Ambulation- i  Managing Meds- i  Follow up appointments reviewed:   PCP Hospital f/u appt confirmed? No    Specialist Hospital f/u appt confirmed? Yes  Scheduled to see General surgeon on 04/20/20   Are transportation arrangements needed? No   If their condition worsens, is the pt aware to call PCP or go to the Emergency Dept.? Yes  Was the patient provided with contact information for the PCP's office or ED? Yes  Was to pt encouraged to call back with questions or concerns? Yes

## 2020-03-21 NOTE — Telephone Encounter (Signed)
Since it was a surgical issue we do not need to have a HFU.  [name]. [name], MD 03/21/2020 10:58 AM

## 2020-06-08 ENCOUNTER — Other Ambulatory Visit: Payer: Self-pay

## 2020-06-08 ENCOUNTER — Ambulatory Visit: Payer: PPO | Admitting: Dermatology

## 2020-06-08 ENCOUNTER — Encounter: Payer: Self-pay | Admitting: Dermatology

## 2020-06-08 DIAGNOSIS — Z8589 Personal history of malignant neoplasm of other organs and systems: Secondary | ICD-10-CM

## 2020-06-08 DIAGNOSIS — L738 Other specified follicular disorders: Secondary | ICD-10-CM | POA: Diagnosis not present

## 2020-06-08 DIAGNOSIS — D0461 Carcinoma in situ of skin of right upper limb, including shoulder: Secondary | ICD-10-CM | POA: Diagnosis not present

## 2020-06-08 DIAGNOSIS — Z1283 Encounter for screening for malignant neoplasm of skin: Secondary | ICD-10-CM | POA: Diagnosis not present

## 2020-06-08 DIAGNOSIS — C44622 Squamous cell carcinoma of skin of right upper limb, including shoulder: Secondary | ICD-10-CM

## 2020-06-08 DIAGNOSIS — L821 Other seborrheic keratosis: Secondary | ICD-10-CM

## 2020-06-08 DIAGNOSIS — Z85828 Personal history of other malignant neoplasm of skin: Secondary | ICD-10-CM

## 2020-06-08 DIAGNOSIS — D1801 Hemangioma of skin and subcutaneous tissue: Secondary | ICD-10-CM | POA: Diagnosis not present

## 2020-06-08 DIAGNOSIS — B079 Viral wart, unspecified: Secondary | ICD-10-CM | POA: Diagnosis not present

## 2020-06-08 DIAGNOSIS — D485 Neoplasm of uncertain behavior of skin: Secondary | ICD-10-CM

## 2020-06-08 NOTE — Patient Instructions (Signed)

## 2020-06-09 ENCOUNTER — Telehealth: Payer: Self-pay

## 2020-06-09 NOTE — Chronic Care Management (AMB) (Signed)
Chronic Care Management Pharmacy Assistant   Name: Dale Wood  MRN: [id number] DOB: Sep 14, 1950  Reason for Encounter: Hypertension Disease State Call  Recent office visits:  03/13/20- Dale Chyle, MD (Video Visit)- Seen for headaches, short course prednisone 50 mg x 5 DS, no follow up indicated  02/14/20- Telephone encounter noted Dr. Jerline Wood prescribed tramadol and zofran for Wood and nausea due to "gallbladder attack"  Recent consult visits:  05/23/22[name], MD (Dermatology)- seen for screening exam for skin cancer, no changes indicated, no follow up documented   Hospital visits:  None in previous 6 months  Medications: Outpatient Encounter Medications as of 06/09/2020  Medication Sig  . acetaminophen (TYLENOL) 500 MG tablet Take 1,000 mg by mouth every 6 (six) hours as needed for moderate Wood or headache.  Marland Kitchen amLODipine (NORVASC) 10 MG tablet Take 10 mg by mouth daily.  . cyclobenzaprine (FLEXERIL) 10 MG tablet Take 1 tablet (10 mg total) by mouth 3 (three) times daily. (Patient not taking: Reported on 06/08/2020)  . famotidine (PEPCID) 20 MG tablet Take 1 tablet (20 mg total) by mouth 2 (two) times daily. (Patient not taking: Reported on 06/08/2020)  . ibuprofen (ADVIL) 200 MG tablet Take 800 mg by mouth every 6 (six) hours as needed for mild Wood or headache. (Patient not taking: Reported on 06/08/2020)  . levothyroxine (SYNTHROID) 50 MCG tablet TAKE 1 TABLET(50 MCG) BY MOUTH DAILY BEFORE BREAKFAST (Patient taking differently: Take 50 mcg by mouth daily before breakfast.)  . losartan (COZAAR) 100 MG tablet TAKE 1 TABLET(100 MG) BY MOUTH DAILY (Patient taking differently: Take 100 mg by mouth daily.)  . Multiple Vitamins-Minerals (ONE-A-DAY MENS 50+) TABS Take 1 tablet by mouth daily.  Marland Kitchen oxyCODONE (OXY IR/ROXICODONE) 5 MG immediate release tablet Take 1 tablet (5 mg total) by mouth every 6 (six) hours as needed for moderate Wood or severe Wood. (Patient not taking:  Reported on 06/08/2020)  . sildenafil (VIAGRA) 100 MG tablet Take 0.5-1 tablets (50-100 mg total) by mouth daily as needed for erectile dysfunction.   No facility-administered encounter medications on file as of 06/09/2020.   Reviewed chart prior to disease state call. Spoke with patient regarding BP  Recent Office Vitals: BP Readings from Last 3 Encounters:  03/18/20 (!) 159/83  01/31/20 133/73  01/29/20 (!) 186/105   Pulse Readings from Last 3 Encounters:  03/18/20 67  01/31/20 85  01/29/20 95    Wt Readings from Last 3 Encounters:  03/18/20 240 lb (108.9 kg)  03/13/20 244 lb (110.7 kg)  01/31/20 246 lb (111.6 kg)     Kidney Function Lab Results  Component Value Date/Time   CREATININE 1.25 03/13/2020 04:05 PM   CREATININE 1.37 (H) 01/29/2020 12:08 AM   CREATININE 1.40 (H) 09/09/2019 03:55 PM   GFR 63.13 11/22/2016 04:48 PM   GFRNONAA 56 (L) 01/29/2020 12:08 AM   GFRAA 72 05/30/2018 02:30 PM    BMP Latest Ref Rng & Units 03/13/2020 01/29/2020 09/09/2019  Glucose 65 - 99 mg/dL 98 180(H) 92  BUN 7 - 25 mg/dL 17 13 16   Creatinine 0.70 - 1.25 mg/dL 1.25 1.37(H) 1.40(H)  BUN/Creat Ratio 6 - 22 (calc) NOT APPLICABLE - 11  Sodium 135 - 146 mmol/L 141 138 141  Potassium 3.5 - 5.3 mmol/L 4.4 4.2 4.3  Chloride 98 - 110 mmol/L 104 103 104  CO2 20 - 32 mmol/L 29 24 28   Calcium 8.6 - 10.3 mg/dL 9.3 9.1 9.0   Dale Wood returned  my call after I left a message with his wife. 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 Dale Wood has no concerns for his health or with receiving his medications. He confirmed that he has been taking all medications every morning as prescribed.  Current antihypertensive regimen:  Losartan 100 mg daily Amlodipine 10 mg daily  How often are you checking your Blood Pressure? Patient stated he doesn't check his BP at home. He has it checked at appointments and at the [facility]  Current home BP readings: N/A  What recent interventions/DTPs have  been made by any provider to improve Blood Pressure control since last CPP Visit: No recent interventions   Any recent hospitalizations or ED visits since last visit with CPP? No  What diet changes have been made to improve Blood Pressure Control?  Patient has had no changes to his diet  What exercise is being done to improve your Blood Pressure Control?  Patient stated he goes to the [facility]  Adherence Review: Is the patient currently on ACE/ARB medication? Yes Does the patient have >5 day gap between last estimated fill dates? No  Star Rating Drugs: Losartan 100 mg- 90 DS last filled 05/07/20

## 2020-06-22 ENCOUNTER — Encounter: Payer: Self-pay | Admitting: Dermatology

## 2020-06-22 NOTE — Addendum Note (Signed)
Addended by: [name] on: 06/22/2020 04:32 AM   Modules accepted: Level of Service

## 2020-06-22 NOTE — Progress Notes (Signed)
Follow-Up Visit   Subjective  Dale Wood is a 70 y.o. male who presents for the following: Annual Exam (Larger thicker lesion right hand, tag left post neck, and per wife spots under eyes cheek area).  General examination with focus on several spots including enlarging crust top of right hand Location:  Duration:  Quality:  Associated Signs/Symptoms: Modifying Factors:  Severity:  Timing: Context:   Objective  Well appearing patient in no apparent distress; mood and affect are within normal limits. Objective  Left Inguinal Area: General skin examination, no atypical pigmented lesions.  Objective  Left Forearm - Posterior: No recurrence  Objective  Left Lower Leg - Anterior: No recurrence  Objective  Left Malar Cheek, Mid Back: Textured brown 6 mm slightly raised flattopped papules  Objective  Head - Anterior (Face): To millimeter eccentrically delled cream-colored papule  Objective  Left Anterior Neck: Filiform wart two millimeter  Objective  Mid Back: 2 mm smooth red papule  Objective  Right Dorsal Hand: Thick pink crust compatible with hypertrophic CIS.  After shave biopsy the base was treated by curettage plus cautery       A full examination was performed including scalp, head, eyes, ears, nose, lips, neck, chest, axillae, abdomen, back, buttocks, bilateral upper extremities, bilateral lower extremities, hands, feet, fingers, toes, fingernails, and toenails. All findings within normal limits unless otherwise noted below.   Assessment & Plan    Screening exam for skin cancer Left Inguinal Area  Annual skin examination, continued ultraviolet protection.  History of basal cell carcinoma (BCC) Left Forearm - Posterior  Recheck as needed  History of squamous cell carcinoma Left Lower Leg - Anterior  Recheck as needed  Seborrheic keratosis (2) Mid Back; Left Malar Cheek  May leave if stable  Sebaceous hyperplasia of face Head -  Anterior (Face)  Told of similar appearance of early basal cell carcinoma so if there is growth or bleeding return for biopsy  Viral warts, unspecified type Left Anterior Neck  May choose to return for removal.  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, including shoulder Right Dorsal Hand  Skin / nail biopsy Type of biopsy: tangential   Informed consent: discussed and consent obtained   Timeout: patient name, date of birth, surgical site, and procedure verified   Anesthesia: the lesion was anesthetized in a standard fashion   Anesthetic:  1% lidocaine w/ epinephrine 1-100,000 local infiltration Instrument used: flexible razor blade   Hemostasis achieved with: aluminum chloride and electrodesiccation   Outcome: patient tolerated procedure well   Post-procedure details: wound care instructions given    Destruction of lesion Complexity: simple   Destruction method: electrodesiccation and curettage   Informed consent: discussed and consent obtained   Timeout:  patient name, date of birth, surgical site, and procedure verified Anesthesia: the lesion was anesthetized in a standard fashion   Anesthetic:  1% lidocaine w/ epinephrine 1-100,000 local infiltration Curettage performed in three different directions: Yes   Curettage cycles:  3 Lesion length (cm):  1 Lesion width (cm):  1 Margin per side (cm):  0 Final wound size (cm):  1 Hemostasis achieved with:  aluminum chloride Outcome: patient tolerated procedure well with no complications   Post-procedure details: wound care instructions given    Specimen 1 - Surgical pathology Differential Diagnosis: wart scc bcc curet and cautery tx with bx  Check Margins: No      I, [name],  MD, have reviewed all documentation for this visit.  The documentation on 06/22/20 for the exam, diagnosis, procedures, and orders are all accurate and complete.

## 2020-07-03 ENCOUNTER — Other Ambulatory Visit: Payer: Self-pay | Admitting: Family Medicine

## 2020-07-03 NOTE — Telephone Encounter (Signed)
Rx refill request approved per Dr. Corey's orders. 

## 2020-08-04 ENCOUNTER — Other Ambulatory Visit: Payer: Self-pay | Admitting: Family Medicine

## 2020-09-01 ENCOUNTER — Other Ambulatory Visit: Payer: Self-pay | Admitting: Family Medicine

## 2020-09-07 ENCOUNTER — Telehealth: Payer: Self-pay | Admitting: Pharmacist

## 2020-09-07 NOTE — Chronic Care Management (AMB) (Addendum)
Chronic Care Management Pharmacy Assistant   Name: Dale Wood  MRN: [id number] DOB: [birth year]  Reason for Encounter: Hypertension Disease State Call    Recent office visits:  None  Recent consult visits:  None  Hospital visits:  None in previous 6 months  Medications: Outpatient Encounter Medications as of 09/07/2020  Medication Sig   acetaminophen (TYLENOL) 500 MG tablet Take 1,000 mg by mouth every 6 (six) hours as needed for moderate pain or headache.   amLODipine (NORVASC) 10 MG tablet Take 10 mg by mouth daily.   cyclobenzaprine (FLEXERIL) 10 MG tablet Take 1 tablet (10 mg total) by mouth 3 (three) times daily. (Patient not taking: Reported on 06/08/2020)   famotidine (PEPCID) 20 MG tablet Take 1 tablet (20 mg total) by mouth 2 (two) times daily. (Patient not taking: Reported on 06/08/2020)   gabapentin (NEURONTIN) 300 MG capsule TAKE 1 CAPSULE(300 MG) BY MOUTH THREE TIMES DAILY AS NEEDED FOR NERVE PAIN   ibuprofen (ADVIL) 200 MG tablet Take 800 mg by mouth every 6 (six) hours as needed for mild pain or headache. (Patient not taking: Reported on 06/08/2020)   levothyroxine (SYNTHROID) 50 MCG tablet TAKE 1 TABLET(50 MCG) BY MOUTH DAILY BEFORE BREAKFAST   losartan (COZAAR) 100 MG tablet TAKE 1 TABLET(100 MG) BY MOUTH DAILY   Multiple Vitamins-Minerals (ONE-A-DAY MENS 50+) TABS Take 1 tablet by mouth daily.   oxyCODONE (OXY IR/ROXICODONE) 5 MG immediate release tablet Take 1 tablet (5 mg total) by mouth every 6 (six) hours as needed for moderate pain or severe pain. (Patient not taking: Reported on 06/08/2020)   sildenafil (VIAGRA) 100 MG tablet Take 0.5-1 tablets (50-100 mg total) by mouth daily as needed for erectile dysfunction.   No facility-administered encounter medications on file as of 09/07/2020.    Reviewed chart prior to disease state call. Spoke with patient regarding BP  Recent Office Vitals: BP Readings from Last 3 Encounters:  03/18/20 (!) 159/83   01/31/20 133/73  01/29/20 (!) 186/105   Pulse Readings from Last 3 Encounters:  03/18/20 67  01/31/20 85  01/29/20 95    Wt Readings from Last 3 Encounters:  03/18/20 240 lb (108.9 kg)  03/13/20 244 lb (110.7 kg)  01/31/20 246 lb (111.6 kg)     Kidney Function Lab Results  Component Value Date/Time   CREATININE 1.25 03/13/2020 04:05 PM   CREATININE 1.37 (H) 01/29/2020 12:08 AM   CREATININE 1.40 (H) 09/09/2019 03:55 PM   GFR 63.13 11/22/2016 04:48 PM   GFRNONAA 56 (L) 01/29/2020 12:08 AM   GFRAA 72 05/30/2018 02:30 PM    BMP Latest Ref Rng & Units 03/13/2020 01/29/2020 09/09/2019  Glucose 65 - 99 mg/dL 98 180(H) 92  BUN 7 - 25 mg/dL '17 13 16  '$ Creatinine 0.70 - 1.25 mg/dL 1.25 1.37(H) 1.40(H)  BUN/Creat Ratio 6 - 22 (calc) NOT APPLICABLE - 11  Sodium 135 - 146 mmol/L 141 138 141  Potassium 3.5 - 5.3 mmol/L 4.4 4.2 4.3  Chloride 98 - 110 mmol/L 104 103 104  CO2 20 - 32 mmol/L '29 24 28  '$ Calcium 8.6 - 10.3 mg/dL 9.3 9.1 9.0    Current antihypertensive regimen:  Losartan 100 mg daily Amlodipine 10 mg daily  How often are you checking your Blood Pressure? 1-2x per week  Current home BP readings: 120/80  What recent interventions/DTPs have been made by any provider to improve Blood Pressure control since last CPP Visit: No interventions or DTPs.  Any recent  hospitalizations or ED visits since last visit with CPP? No  What diet changes have been made to improve Blood Pressure Control?  Patient states he eats a healthy diet. No recent changes.  What exercise is being done to improve your Blood Pressure Control?  Patient states he like to walk and go the the gym three times a week.  Adherence Review: Is the patient currently on ACE/ARB medication? Yes Does the patient have >5 day gap between last estimated fill dates? No  Future Appointments  Date Time Provider [place]  11/12/2020  3:15 PM [facility]     Star Rating  Drugs: Losartan 100 mg last filled 05/07/2020 90 DS  April D Calhoun, [place] Pharmacist Assistant [phone]   10 minutes spent in review, coordination, and documentation.  Reviewed by: Beverly Milch, PharmD Clinical Pharmacist [phone]

## 2020-10-21 ENCOUNTER — Telehealth: Payer: Self-pay | Admitting: Pharmacist

## 2020-10-21 NOTE — Chronic Care Management (AMB) (Signed)
Chronic Care Management Pharmacy Assistant   Name: Dale Wood  MRN: [id number] DOB: [birth year]   Reason for Encounter: Hypertension Adherence Call    Recent office visits:  None  Recent consult visits:  None  Hospital visits:  None in previous 6 months  Medications: Outpatient Encounter Medications as of 10/21/2020  Medication Sig   acetaminophen (TYLENOL) 500 MG tablet Take 1,000 mg by mouth every 6 (six) hours as needed for moderate pain or headache.   amLODipine (NORVASC) 10 MG tablet Take 10 mg by mouth daily.   cyclobenzaprine (FLEXERIL) 10 MG tablet Take 1 tablet (10 mg total) by mouth 3 (three) times daily. (Patient not taking: Reported on 06/08/2020)   famotidine (PEPCID) 20 MG tablet Take 1 tablet (20 mg total) by mouth 2 (two) times daily. (Patient not taking: Reported on 06/08/2020)   gabapentin (NEURONTIN) 300 MG capsule TAKE 1 CAPSULE(300 MG) BY MOUTH THREE TIMES DAILY AS NEEDED FOR NERVE PAIN   ibuprofen (ADVIL) 200 MG tablet Take 800 mg by mouth every 6 (six) hours as needed for mild pain or headache. (Patient not taking: Reported on 06/08/2020)   levothyroxine (SYNTHROID) 50 MCG tablet TAKE 1 TABLET(50 MCG) BY MOUTH DAILY BEFORE BREAKFAST   losartan (COZAAR) 100 MG tablet TAKE 1 TABLET(100 MG) BY MOUTH DAILY   Multiple Vitamins-Minerals (ONE-A-DAY MENS 50+) TABS Take 1 tablet by mouth daily.   oxyCODONE (OXY IR/ROXICODONE) 5 MG immediate release tablet Take 1 tablet (5 mg total) by mouth every 6 (six) hours as needed for moderate pain or severe pain. (Patient not taking: Reported on 06/08/2020)   sildenafil (VIAGRA) 100 MG tablet Take 0.5-1 tablets (50-100 mg total) by mouth daily as needed for erectile dysfunction.   No facility-administered encounter medications on file as of 10/21/2020.   Reviewed chart prior to disease state call. Spoke with patient regarding BP  Recent Office Vitals: BP Readings from Last 3 Encounters:  03/18/20 (!) 159/83  01/31/20  133/73  01/29/20 (!) 186/105   Pulse Readings from Last 3 Encounters:  03/18/20 67  01/31/20 85  01/29/20 95    Wt Readings from Last 3 Encounters:  03/18/20 240 lb (108.9 kg)  03/13/20 244 lb (110.7 kg)  01/31/20 246 lb (111.6 kg)     Kidney Function Lab Results  Component Value Date/Time   CREATININE 1.25 03/13/2020 04:05 PM   CREATININE 1.37 (H) 01/29/2020 12:08 AM   CREATININE 1.40 (H) 09/09/2019 03:55 PM   GFR 63.13 11/22/2016 04:48 PM   GFRNONAA 56 (L) 01/29/2020 12:08 AM   GFRAA 72 05/30/2018 02:30 PM    BMP Latest Ref Rng & Units 03/13/2020 01/29/2020 09/09/2019  Glucose 65 - 99 mg/dL 98 180(H) 92  BUN 7 - 25 mg/dL 17 13 16   Creatinine 0.70 - 1.25 mg/dL 1.25 1.37(H) 1.40(H)  BUN/Creat Ratio 6 - 22 (calc) NOT APPLICABLE - 11  Sodium 135 - 146 mmol/L 141 138 141  Potassium 3.5 - 5.3 mmol/L 4.4 4.2 4.3  Chloride 98 - 110 mmol/L 104 103 104  CO2 20 - 32 mmol/L 29 24 28   Calcium 8.6 - 10.3 mg/dL 9.3 9.1 9.0    Current antihypertensive regimen:  Losartan 100 mg daily Amlodipine 10 mg daily  How often are you checking your Blood Pressure? 3-5x per week  Current home BP readings: 120/77  What recent interventions/DTPs have been made by any provider to improve Blood Pressure control since last CPP Visit: No recent interventions or DTPs.  Any recent  hospitalizations or ED visits since last visit with CPP? No  What diet changes have been made to improve Blood Pressure Control?  Patient states he is not currently on any diet and hasn't made any recent changes.  What exercise is being done to improve your Blood Pressure Control?  Goes to the [facility] 3 times a week and rides a bike, on feet 8 hrs a day at work  Adherence Review: Is the patient currently on ACE/ARB medication? Yes Does the patient have >5 day gap between last estimated fill dates? No  Patient states he would like refills sent in for Amlodipine. I sent a message to team [name] requesting said  refill.  Care Gaps: Medicare Annual Wellness: scheduled AWV for 11/12/2020 Hemoglobin A1C: 6.2% on 12/10/2019 Colonoscopy: Overdue since 10/11/2020   Future Appointments  Date Time Provider [place]  11/12/2020  3:15 PM [facility] CO[facility]     Star Rating Drugs: Losartan 100 mg last filled 05/07/2020  April D Calhoun, [place] Pharmacist Assistant [phone]

## 2020-10-26 ENCOUNTER — Other Ambulatory Visit: Payer: Self-pay | Admitting: *Deleted

## 2020-10-26 NOTE — Telephone Encounter (Signed)
Last refill by historical provider  

## 2020-10-27 MED ORDER — AMLODIPINE BESYLATE 10 MG PO TABS: 10.0000 mg | ORAL_TABLET | Freq: Every day | ORAL | 3 refills | Status: DC

## 2020-10-27 NOTE — Telephone Encounter (Signed)
Last refilled by historical provider  

## 2020-10-27 NOTE — Addendum Note (Signed)
Addended by: Betti Cruz on: 10/27/2020 02:35 PM   Modules accepted: Orders

## 2020-10-27 NOTE — Telephone Encounter (Signed)
.   Encourage patient to contact the pharmacy for refills or they can request refills through [place]:  Please schedule appointment if longer than 1 year  NEXT APPOINTMENT DATE:  MEDICATION:amLODipine (NORVASC) 10 MG tablet  Is the patient out of medication?   PHARMACY:[id number] - [place], [place] AT [address]  Let patient know to contact pharmacy at the end of the day to make sure medication is ready.  Please notify patient to allow 48-72 hours to process

## 2020-11-12 ENCOUNTER — Ambulatory Visit (INDEPENDENT_AMBULATORY_CARE_PROVIDER_SITE_OTHER): Payer: PPO

## 2020-11-12 DIAGNOSIS — Z0000 Encounter for general adult medical examination without abnormal findings: Secondary | ICD-10-CM | POA: Diagnosis not present

## 2020-11-12 NOTE — Progress Notes (Signed)
Virtual Visit via Telephone Note  I connected with  Dale Wood on 11/12/20 at  3:15 PM EDT by telephone and verified that I am speaking with the correct person using two identifiers.  Medicare Annual Wellness visit completed telephonically due to Covid-19 pandemic.   Persons participating in this call: This Health Coach and this patient.   Location: Patient: Home Provider: Office   I discussed the limitations, risks, security and privacy concerns of performing an evaluation and management service by telephone and the availability of in person appointments. The patient expressed understanding and agreed to proceed.  Unable to perform video visit due to video visit attempted and failed and/or patient does not have video capability.   Some vital signs may be absent or patient reported.   Willette Brace, LPN   Subjective:   Dale Wood is a 70 y.o. male who presents for Medicare Annual/Subsequent preventive examination.  Review of Systems     Cardiac Risk Factors include: advanced age (>62men, >22 women);hypertension;dyslipidemia;male gender;obesity (BMI >30kg/m2)     Objective:    There were no vitals filed for this visit. There is no height or weight on file to calculate BMI.  Advanced Directives 11/12/2020 03/18/2020 11/07/2019 07/26/2019 09/18/2018 03/22/2018 09/08/2017  Does Patient Have a Medical Advance Directive? Yes No Yes No Yes No No  Type of Advance Directive - - - - Living will;Healthcare Power of Attorney - -  Does patient want to make changes to medical advance directive? No - Patient declined - Yes (MAU/Ambulatory/Procedural Areas - Information given) - No - Patient declined - -  Copy of [place] in Chart? - - - - No - copy requested - -  Would patient like information on creating a medical advance directive? - No - Patient declined - No - Patient declined - No - Patient declined No - Patient declined    Current Medications  (verified) Outpatient Encounter Medications as of 11/12/2020  Medication Sig   amLODipine (NORVASC) 10 MG tablet Take 1 tablet (10 mg total) by mouth daily.   levothyroxine (SYNTHROID) 50 MCG tablet TAKE 1 TABLET(50 MCG) BY MOUTH DAILY BEFORE BREAKFAST   losartan (COZAAR) 100 MG tablet TAKE 1 TABLET(100 MG) BY MOUTH DAILY   Multiple Vitamins-Minerals (ONE-A-DAY MENS 50+) TABS Take 1 tablet by mouth daily.   sildenafil (VIAGRA) 100 MG tablet Take 0.5-1 tablets (50-100 mg total) by mouth daily as needed for erectile dysfunction.   [DISCONTINUED] acetaminophen (TYLENOL) 500 MG tablet Take 1,000 mg by mouth every 6 (six) hours as needed for moderate pain or headache. (Patient not taking: Reported on 11/12/2020)   [DISCONTINUED] cyclobenzaprine (FLEXERIL) 10 MG tablet Take 1 tablet (10 mg total) by mouth 3 (three) times daily. (Patient not taking: Reported on 06/08/2020)   [DISCONTINUED] famotidine (PEPCID) 20 MG tablet Take 1 tablet (20 mg total) by mouth 2 (two) times daily. (Patient not taking: Reported on 06/08/2020)   [DISCONTINUED] gabapentin (NEURONTIN) 300 MG capsule TAKE 1 CAPSULE(300 MG) BY MOUTH THREE TIMES DAILY AS NEEDED FOR NERVE PAIN   [DISCONTINUED] ibuprofen (ADVIL) 200 MG tablet Take 800 mg by mouth every 6 (six) hours as needed for mild pain or headache. (Patient not taking: Reported on 06/08/2020)   [DISCONTINUED] oxyCODONE (OXY IR/ROXICODONE) 5 MG immediate release tablet Take 1 tablet (5 mg total) by mouth every 6 (six) hours as needed for moderate pain or severe pain. (Patient not taking: Reported on 06/08/2020)   No facility-administered encounter medications on file  as of 11/12/2020.    Allergies (verified) Androgel [testosterone] and Penicillins   History: Past Medical History:  Diagnosis Date   Abdominal pain, epigastric 10/27/2008   Qualifier: Diagnosis of  By: Ronnald Ramp MD, [place], RIGHT 09/22/2009   Qualifier: Diagnosis of  By: Nathaneil Canary, CMA, [name]      Basal cell carcinoma 10/29/2012   left forearm tx cx3 69fu   BASAL CELL CARCINOMA, FACE 10/27/2008   Qualifier: Diagnosis of  By: Ronnald Ramp MD, [name].    BASAL CELL CARCINOMA, FACE 10/27/2008   Qualifier: Diagnosis of By: Ronnald Ramp MD, [name].    Cancer [facility])    Skin Cancer on Nose   CELLULITIS, FOOT 09/22/2009   Qualifier: Diagnosis of  By: Ronnald Ramp MD, [name].    Constipation 70/62/3762   DYSMETABOLIC SYNDROME 09/17/5174   Qualifier: Diagnosis of  By: Ronnald Ramp MD, [place] 10/27/2008   Qualifier: Diagnosis of  By: Ronnald Ramp MD, [name].    ERECTILE DYSFUNCTION, ORGANIC 10/27/2008   Qualifier: Diagnosis of  By: Ronnald Ramp MD, [name].    Headache 07/30/2015   Heart murmur    History of kidney stones    about 30 passed   Hyperlipidemia    HYPERLIPIDEMIA 10/27/2008   Qualifier: Diagnosis of  By: Ronnald Ramp MD, [name].    Hypertension    HYPOGONADISM 11/26/2008   Qualifier: Diagnosis of  By: Ronnald Ramp MD, [name].    Hypothyroidism    Kidney stones    Low testosterone    NEPHROLITHIASIS, HX OF 10/27/2008   Qualifier: Diagnosis of  By: Ronnald Ramp MD, [name].    Nephrolithiasis, uric acid    OSTEOARTHRITIS 10/27/2008   Qualifier: Diagnosis of  By: Ronnald Ramp MD, [name].    Positive TB test    Postherpetic neuralgia 03/27/2017   Rheumatic fever    Rheumatic fever    SKIN CANCER, HX OF 10/27/2008   Qualifier: Diagnosis of  By: Ronnald Ramp MD, [name].    Squamous cell carcinoma of skin 11/27/2013   left shin tx with bx curet and 5 fu   Tachycardia 11/09/2017   Thyroid disease    Tuberculosis 1986 ish   treated   Unspecified disorder of kidney and ureter    URI 02/05/2009   Qualifier: Diagnosis of  By: Ronnald Ramp MD, [name].    Past Surgical History:  Procedure Laterality Date   CHOLECYSTECTOMY N/A 03/18/2020   Procedure: LAPAROSCOPIC CHOLECYSTECTOMY;  Surgeon: Coralie Keens, MD;  Location: [place];  Service: General;  Laterality: N/A;  60 MIN TOTAL - ROOM 10   KNEE SURGERY Right    2005, 2008.  Scope for MCL tear   TONSILLECTOMY AND ADENOIDECTOMY     UMBILICAL HERNIA REPAIR N/A 03/18/2020   Procedure: HERNIA REPAIR UMBILICAL ADULT;  Surgeon: Coralie Keens, MD;  Location: [place];  Service: General;  Laterality: N/A;   Family History  Problem Relation Age of Onset   Cancer Mother    Diabetes Mother    Cancer Father    Early death Father    Diabetes Brother    Cancer Brother        prostate   Drug abuse Brother    Birth defects Sister    Diabetes Maternal Grandmother    Thyroid disease Maternal Grandmother    Stroke Maternal Grandmother    Tremor Maternal Grandfather    Obesity Son    Social History   Socioeconomic History   Marital  status: Married    Spouse name: Not on file   Number of children: Not on file   Years of education: Not on file   Highest education level: Not on file  Occupational History   Not on file  Tobacco Use   Smoking status: Former    Types: Cigars, Pipe    Quit date: 2001    Years since quitting: 21.8   Smokeless tobacco: Never  Vaping Use   Vaping Use: Never used  Substance and Sexual Activity   Alcohol use: Yes    Comment: 1-2 times a year   Drug use: No   Sexual activity: Yes  Other Topics Concern   Not on file  Social History Narrative   Not on file   Social Determinants of Health   Financial Resource Strain: Low Risk    Difficulty of Paying Living Expenses: Not hard at all  Food Insecurity: No Food Insecurity   Worried About Charity fundraiser in the Last Year: Never true   [place] in the Last Year: Never true  Transportation Needs: No Transportation Needs   Lack of Transportation (Medical): No   Lack of Transportation (Non-Medical): No  Physical Activity: Sufficiently Active   Days of Exercise per Week: 3 days   Minutes of Exercise per Session: 60 min  Stress: No Stress Concern Present   Feeling of Stress : Not at all  Social Connections: Socially Integrated   Frequency of Communication with Friends and  Family: Once a week   Frequency of Social Gatherings with Friends and Family: More than three times a week   Attends Religious Services: More than 4 times per year   Active Member of [employer] or Organizations: Yes   Attends Archivist Meetings: 1 to 4 times per year   Marital Status: Married    Tobacco Counseling Counseling given: Not Answered   Clinical Intake:  Pre-visit preparation completed: Yes  Pain : No/denies pain     Nutritional Risks: Nausea/ vomitting/ diarrhea (stomach bug has subsided) Diabetes: No  How often do you need to have someone help you when you read instructions, pamphlets, or other written materials from your doctor or pharmacy?: 1 - Never  Diabetic?no  Interpreter Needed?: No  Information entered by :: Charlott Rakes, LPN   Activities of Daily Living In your present state of health, do you have any difficulty performing the following activities: 11/12/2020 03/18/2020  Hearing? Y N  Comment slight hearing loss -  Vision? N N  Difficulty concentrating or making decisions? N N  Walking or climbing stairs? N N  Dressing or bathing? N N  Doing errands, shopping? N -  Preparing Food and eating ? N -  Using the Toilet? N -  In the past six months, have you accidently leaked urine? N -  Do you have problems with loss of bowel control? N -  Managing your Medications? N -  Managing your Finances? N -  Housekeeping or managing your Housekeeping? N -  Some recent data might be hidden    Patient Care Team: Vivi Barrack, MD as PCP - General (Family Medicine) Josue Hector, MD as Consulting Physician (Cardiology) Juluis Rainier as Consulting Physician (Optometry) Madelin Rear, [facility] as Pharmacist (Pharmacist) [name], MD as Consulting Physician (Dermatology)  Indicate any recent Medical Services you may have received from other than [facility] providers in the past year (date may be approximate).     Assessment:  This is a routine  wellness examination for Dale Wood.  Hearing/Vision screen Hearing Screening - Comments:: Pt has slight loss  Vision Screening - Comments:: Pt follows up with Dr Idolina Primer for annual eye exams   Dietary issues and exercise activities discussed: Current Exercise Habits: Home exercise routine, Type of exercise: strength training/weights;Other - see comments, Time (Minutes): > 60, Frequency (Times/Week): 3, Weekly Exercise (Minutes/Week): 0   Goals Addressed             This Visit's Progress    lose weight         Depression Screen PHQ 2/9 Scores 11/12/2020 11/07/2019 09/18/2018 09/08/2017 11/22/2016  PHQ - 2 Score 0 0 0 0 0    Fall Risk Fall Risk  11/12/2020 11/07/2019 09/18/2018 09/08/2017 11/22/2016  Falls in the past year? 0 0 0 No No  Number falls in past yr: 0 0 0 - -  Injury with Fall? 0 0 0 - -  Risk for fall due to : - Impaired vision - - -  Follow up Falls prevention discussed Falls prevention discussed Education provided - -    FALL RISK PREVENTION PERTAINING TO THE HOME:  Any stairs in or around the home? Yes  If so, are there any without handrails? No  Home free of loose throw rugs in walkways, pet beds, electrical cords, etc? Yes  Adequate lighting in your home to reduce risk of falls? Yes   ASSISTIVE DEVICES UTILIZED TO PREVENT FALLS:  Life alert? No  Use of a cane, walker or w/c? No  Grab bars in the bathroom? Yes  Shower chair or bench in shower? No  Elevated toilet seat or a handicapped toilet? No   TIMED UP AND GO:  Was the test performed? No .   Cognitive Function:     6CIT Screen 11/12/2020 11/07/2019 09/18/2018 09/08/2017  What Year? 0 points 0 points 0 points 0 points  What month? 0 points 0 points 0 points 0 points  What time? 0 points - 0 points 0 points  Count back from 20 0 points 0 points 0 points 0 points  Months in reverse 0 points 0 points 0 points 0 points  Repeat phrase 0 points 0 points - -  Total Score 0 - - -     Immunizations Immunization History  Administered Date(s) Administered   Fluad Quad(high Dose 65+) 09/18/2018   Influenza, High Dose Seasonal PF 11/22/2016   Influenza-Unspecified 09/17/2016   PFIZER(Purple Top)SARS-COV-2 Vaccination 03/30/2019, 04/20/2019, 11/05/2019, 05/03/2020   Pneumococcal Conjugate-13 11/22/2016   Pneumococcal Polysaccharide-23 01/15/2019   Td 01/17/2001   Tdap 08/25/2010   Zoster Recombinat (Shingrix) 10/25/2019, 12/25/2019    TDAP status: Due, Education has been provided regarding the importance of this vaccine. Advised may receive this vaccine at local pharmacy or Health Dept. Aware to provide a copy of the vaccination record if obtained from local pharmacy or Health Dept. Verbalized acceptance and understanding.  Flu Vaccine status: Due, Education has been provided regarding the importance of this vaccine. Advised may receive this vaccine at local pharmacy or Health Dept. Aware to provide a copy of the vaccination record if obtained from local pharmacy or Health Dept. Verbalized acceptance and understanding.  Pneumococcal vaccine status: Up to date  Covid-19 vaccine status: Completed vaccines  Qualifies for Shingles Vaccine? Yes   Zostavax completed Yes   Shingrix Completed?: Yes  Screening Tests Health Maintenance  Topic Date Due   COVID-19 Vaccine (5 - Booster for [place] series) 06/28/2020  INFLUENZA VACCINE  08/17/2020   TETANUS/TDAP  08/24/2020   COLONOSCOPY (Pts 45-31yrs Insurance coverage will need to be confirmed)  11/12/2021 (Originally 10/11/2020)   Pneumonia Vaccine 56+ Years old  Completed   Hepatitis C Screening  Completed   Zoster Vaccines- Shingrix  Completed   HPV VACCINES  Aged Out    Health Maintenance  Health Maintenance Due  Topic Date Due   COVID-19 Vaccine (5 - Booster for Pfizer series) 06/28/2020   INFLUENZA VACCINE  08/17/2020   TETANUS/TDAP  08/24/2020    Colorectal cancer screening: No longer required.    Additional Screening:  Hepatitis C Screening:  Completed 11/07/15  Vision Screening: Recommended annual ophthalmology exams for early detection of glaucoma and other disorders of the eye. Is the patient up to date with their annual eye exam?  Yes  Who is the provider or what is the name of the office in which the patient attends annual eye exams? Dr Idolina Primer If pt is not established with a provider, would they like to be referred to a provider to establish care? No .   Dental Screening: Recommended annual dental exams for proper oral hygiene  Community Resource Referral / Chronic Care Management: CRR required this visit?  No   CCM required this visit?  No      Plan:     I have personally reviewed and noted the following in the patient's chart:   Medical and social history Use of alcohol, tobacco or illicit drugs  Current medications and supplements including opioid prescriptions. Patient is not currently taking opioid prescriptions. Functional ability and status Nutritional status Physical activity Advanced directives List of other physicians Hospitalizations, surgeries, and ER visits in previous 12 months Vitals Screenings to include cognitive, depression, and falls Referrals and appointments  In addition, I have reviewed and discussed with patient certain preventive protocols, quality metrics, and best practice recommendations. A written personalized care plan for preventive services as well as general preventive health recommendations were provided to patient.     Willette Brace, LPN   [id number]   Nurse Notes: None

## 2020-11-12 NOTE — Patient Instructions (Addendum)
[name] , Thank you for taking time to come for your Medicare Wellness Visit. I appreciate your ongoing commitment to your health goals. Please review the following plan we discussed and let me know if I can assist you in the future.   Screening recommendations/referrals: Colonoscopy: No longer required per pt  Recommended yearly ophthalmology/optometry visit for glaucoma screening and checkup Recommended yearly dental visit for hygiene and checkup  Vaccinations: Influenza vaccine: Pt stated completed , unable to confirm per RX Pneumococcal vaccine: Up to date Tdap vaccine: Due and discussed Shingles vaccine: Completed 10/25/19 & 12/25/19   Covid-19: Completed 3/13, 4/3, 11/05/19 & 05/03/20  Advanced directives: Advance directive discussed with you today. Even though you declined this today please call our office should you change your mind and we can give you the proper paperwork for you to fill out.   Conditions/risks identified: Lose weight   Next appointment: Follow up in one year for your annual wellness visit.   Preventive Care 22 Years and Older, Male Preventive care refers to lifestyle choices and visits with your health care provider that can promote health and wellness. What does preventive care include? A yearly physical exam. This is also called an annual well check. Dental exams once or twice a year. Routine eye exams. Ask your health care provider how often you should have your eyes checked. Personal lifestyle choices, including: Daily care of your teeth and gums. Regular physical activity. Eating a healthy diet. Avoiding tobacco and drug use. Limiting alcohol use. Practicing safe sex. Taking low doses of aspirin every day. Taking vitamin and mineral supplements as recommended by your health care provider. What happens during an annual well check? The services and screenings done by your health care provider during your annual well check will depend on your age,  overall health, lifestyle risk factors, and family history of disease. Counseling  Your health care provider may ask you questions about your: Alcohol use. Tobacco use. Drug use. Emotional well-being. Home and relationship well-being. Sexual activity. Eating habits. History of falls. Memory and ability to understand (cognition). Work and work Statistician. Screening  You may have the following tests or measurements: Height, weight, and BMI. Blood pressure. Lipid and cholesterol levels. These may be checked every 5 years, or more frequently if you are over 86 years old. Skin check. Lung cancer screening. You may have this screening every year starting at age 32 if you have a 30-pack-year history of smoking and currently smoke or have quit within the past 15 years. Fecal occult blood test (FOBT) of the stool. You may have this test every year starting at age 44. Flexible sigmoidoscopy or colonoscopy. You may have a sigmoidoscopy every 5 years or a colonoscopy every 10 years starting at age 47. Prostate cancer screening. Recommendations will vary depending on your family history and other risks. Hepatitis C blood test. Hepatitis B blood test. Sexually transmitted disease (STD) testing. Diabetes screening. This is done by checking your blood sugar (glucose) after you have not eaten for a while (fasting). You may have this done every 1-3 years. Abdominal aortic aneurysm (AAA) screening. You may need this if you are a current or former smoker. Osteoporosis. You may be screened starting at age 1 if you are at high risk. Talk with your health care provider about your test results, treatment options, and if necessary, the need for more tests. Vaccines  Your health care provider may recommend certain vaccines, such as: Influenza vaccine. This is recommended every year.  Tetanus, diphtheria, and acellular pertussis (Tdap, Td) vaccine. You may need a Td booster every 10 years. Zoster vaccine.  You may need this after age 37. Pneumococcal 13-valent conjugate (PCV13) vaccine. One dose is recommended after age 40. Pneumococcal polysaccharide (PPSV23) vaccine. One dose is recommended after age 6. Talk to your health care provider about which screenings and vaccines you need and how often you need them. This information is not intended to replace advice given to you by your health care provider. Make sure you discuss any questions you have with your health care provider. Document Released: 01/30/2015 Document Revised: 09/23/2015 Document Reviewed: 11/04/2014 Elsevier Interactive Patient Education  2017 [place] Prevention in the Home Falls can cause injuries. They can happen to people of all ages. There are many things you can do to make your home safe and to help prevent falls. What can I do on the outside of my home? Regularly fix the edges of walkways and driveways and fix any cracks. Remove anything that might make you trip as you walk through a door, such as a raised step or threshold. Trim any bushes or trees on the path to your home. Use bright outdoor lighting. Clear any walking paths of anything that might make someone trip, such as rocks or tools. Regularly check to see if handrails are loose or broken. Make sure that both sides of any steps have handrails. Any raised decks and porches should have guardrails on the edges. Have any leaves, snow, or ice cleared regularly. Use sand or salt on walking paths during winter. Clean up any spills in your garage right away. This includes oil or grease spills. What can I do in the bathroom? Use night lights. Install grab bars by the toilet and in the tub and shower. Do not use towel bars as grab bars. Use non-skid mats or decals in the tub or shower. If you need to sit down in the shower, use a plastic, non-slip stool. Keep the floor dry. Clean up any water that spills on the floor as soon as it happens. Remove soap  buildup in the tub or shower regularly. Attach bath mats securely with double-sided non-slip rug tape. Do not have throw rugs and other things on the floor that can make you trip. What can I do in the bedroom? Use night lights. Make sure that you have a light by your bed that is easy to reach. Do not use any sheets or blankets that are too big for your bed. They should not hang down onto the floor. Have a firm chair that has side arms. You can use this for support while you get dressed. Do not have throw rugs and other things on the floor that can make you trip. What can I do in the kitchen? Clean up any spills right away. Avoid walking on wet floors. Keep items that you use a lot in easy-to-reach places. If you need to reach something above you, use a strong step stool that has a grab bar. Keep electrical cords out of the way. Do not use floor polish or wax that makes floors slippery. If you must use wax, use non-skid floor wax. Do not have throw rugs and other things on the floor that can make you trip. What can I do with my stairs? Do not leave any items on the stairs. Make sure that there are handrails on both sides of the stairs and use them. Fix handrails that are broken or loose.  Make sure that handrails are as long as the stairways. Check any carpeting to make sure that it is firmly attached to the stairs. Fix any carpet that is loose or worn. Avoid having throw rugs at the top or bottom of the stairs. If you do have throw rugs, attach them to the floor with carpet tape. Make sure that you have a light switch at the top of the stairs and the bottom of the stairs. If you do not have them, ask someone to add them for you. What else can I do to help prevent falls? Wear shoes that: Do not have high heels. Have rubber bottoms. Are comfortable and fit you well. Are closed at the toe. Do not wear sandals. If you use a stepladder: Make sure that it is fully opened. Do not climb a closed  stepladder. Make sure that both sides of the stepladder are locked into place. Ask someone to hold it for you, if possible. Clearly mark and make sure that you can see: Any grab bars or handrails. First and last steps. Where the edge of each step is. Use tools that help you move around (mobility aids) if they are needed. These include: Canes. Walkers. Scooters. Crutches. Turn on the lights when you go into a dark area. Replace any light bulbs as soon as they burn out. Set up your furniture so you have a clear path. Avoid moving your furniture around. If any of your floors are uneven, fix them. If there are any pets around you, be aware of where they are. Review your medicines with your doctor. Some medicines can make you feel dizzy. This can increase your chance of falling. Ask your doctor what other things that you can do to help prevent falls. This information is not intended to replace advice given to you by your health care provider. Make sure you discuss any questions you have with your health care provider. Document Released: 10/30/2008 Document Revised: 06/11/2015 Document Reviewed: 02/07/2014 Elsevier Interactive Patient Education  2017 [employer].

## 2020-12-03 ENCOUNTER — Other Ambulatory Visit: Payer: Self-pay | Admitting: Family Medicine

## 2020-12-24 DIAGNOSIS — M170 Bilateral primary osteoarthritis of knee: Secondary | ICD-10-CM | POA: Diagnosis not present

## 2020-12-24 DIAGNOSIS — M1712 Unilateral primary osteoarthritis, left knee: Secondary | ICD-10-CM | POA: Diagnosis not present

## 2020-12-24 DIAGNOSIS — M1711 Unilateral primary osteoarthritis, right knee: Secondary | ICD-10-CM | POA: Diagnosis not present

## 2021-01-29 DIAGNOSIS — M1712 Unilateral primary osteoarthritis, left knee: Secondary | ICD-10-CM | POA: Diagnosis not present

## 2021-01-29 DIAGNOSIS — M25562 Pain in left knee: Secondary | ICD-10-CM | POA: Diagnosis not present

## 2021-02-01 DIAGNOSIS — M25562 Pain in left knee: Secondary | ICD-10-CM | POA: Diagnosis not present

## 2021-02-05 DIAGNOSIS — S83242A Other tear of medial meniscus, current injury, left knee, initial encounter: Secondary | ICD-10-CM | POA: Diagnosis not present

## 2021-03-02 DIAGNOSIS — M23322 Other meniscus derangements, posterior horn of medial meniscus, left knee: Secondary | ICD-10-CM | POA: Diagnosis not present

## 2021-03-02 DIAGNOSIS — G8918 Other acute postprocedural pain: Secondary | ICD-10-CM | POA: Diagnosis not present

## 2021-03-02 DIAGNOSIS — M958 Other specified acquired deformities of musculoskeletal system: Secondary | ICD-10-CM | POA: Diagnosis not present

## 2021-03-02 DIAGNOSIS — M948X6 Other specified disorders of cartilage, lower leg: Secondary | ICD-10-CM | POA: Diagnosis not present

## 2021-03-02 DIAGNOSIS — M23222 Derangement of posterior horn of medial meniscus due to old tear or injury, left knee: Secondary | ICD-10-CM | POA: Diagnosis not present

## 2021-04-02 DIAGNOSIS — M25562 Pain in left knee: Secondary | ICD-10-CM | POA: Diagnosis not present

## 2021-04-06 DIAGNOSIS — M25562 Pain in left knee: Secondary | ICD-10-CM | POA: Diagnosis not present

## 2021-04-09 DIAGNOSIS — M25562 Pain in left knee: Secondary | ICD-10-CM | POA: Diagnosis not present

## 2021-04-13 DIAGNOSIS — M25562 Pain in left knee: Secondary | ICD-10-CM | POA: Diagnosis not present

## 2021-04-16 DIAGNOSIS — M25562 Pain in left knee: Secondary | ICD-10-CM | POA: Diagnosis not present

## 2021-05-04 DIAGNOSIS — Z5189 Encounter for other specified aftercare: Secondary | ICD-10-CM | POA: Diagnosis not present

## 2021-05-19 ENCOUNTER — Telehealth: Payer: Self-pay

## 2021-05-19 NOTE — Telephone Encounter (Signed)
Patient has called in stating he is having sever side pain.  I have sent patient to triage.

## 2021-05-19 NOTE — Telephone Encounter (Signed)
Patient Name: [facility] CUE Gender: Male DOB: 1950/08/07 Age: 70 Y 15 M 11 D Return Phone Number: [phone] (Secondary) Address: City/ State/ Zip: [place]  [address] Client [facility] Client Site [facility] Provider [name]- MD Contact Type Call Who Is Calling Patient / Member / Family / Caregiver Call Type Triage / Clinical Relationship To Patient Self Return Phone Number [phone] (Secondary) Chief Complaint Flank Pain Reason for Call Symptomatic / Request for Celina states that he is having side pain that he describes as severe. he has taken ibuprofen that has helped. Translation No Nurse Assessment Nurse: [name], RN, Adriana Date/Time (Eastern Time): 05/19/2021 9:39:07 AM Confirm and document reason for call. If symptomatic, describe symptoms. ---pt reports left side pain under ribs. states this morning it was more to the front. has been ongoing since monday. denies any other sx. Does the patient have any new or worsening symptoms? ---Yes Will a triage be completed? ---Yes Related visit to physician within the last 2 weeks? ---No Does the PT have any chronic conditions? (i.e. diabetes, asthma, this includes High risk factors for pregnancy, etc.) ---Yes List chronic conditions. ---htn Is this a behavioral health or substance abuse call? ---No Guidelines Guideline Title Affirmed Question Affirmed Notes Nurse Date/Time Eilene Ghazi Time) Flank Pain History of kidney stones Crist Fat, Fabio Bering 05/19/2021 9:43:20 AM Disp. Time Eilene Ghazi Time) Disposition Final User 05/19/2021 9:47:46 AM SEE PCP WITHIN 3 DAYS Yes [name], RN, Fabio Bering  Caller Disagree/Comply Comply Caller Understands Yes PreDisposition Call Doctor Care Advice Given Per Guideline SEE PCP WITHIN 3 DAYS: * You need to be seen within 2 or 3 days. * PCP VISIT: Call your doctor (or NP/PA)  during regular office hours and make an appointment. A clinic or urgent care center are good places to go for care if your doctor's office is closed or you can't get an appointment. NOTE: If office will be open tomorrow, tell caller to call then, not in 3 days. CALL BACK IF: * Fever over 100.4 F (38.0 C) * Burning with urination or blood in urine * You become worse CARE ADVICE given per Flank Pain (Adult) guideline. PAIN MEDICINES: * ACETAMINOPHEN - REGULAR STRENGTH TYLENOL: Take 650 mg (two 325 mg pills) by mouth every 4 to 6 hours as needed. Each Regular Strength Tylenol pill has 325 mg of acetaminophen. The most you should take is 10 pills a day (3,250 mg total). Note: In [place], the maximum is 12 pills a day (3,900 mg total). * IBUPROFEN (E.G., MOTRIN, ADVIL): Take 400 mg (two 200 mg pills) by mouth every 6 hours. The most you should take is 6 pills a day (1,200 mg total). Comments User: Kizzie Fantasia, RN Date/Time Eilene Ghazi Time): 05/19/2021 9:43:06 AM had a knee surgery on 2/14

## 2021-05-19 NOTE — Telephone Encounter (Signed)
See note

## 2021-05-19 NOTE — Telephone Encounter (Signed)
Patient has OV on 05/21/2021 with PCP  ?

## 2021-05-21 ENCOUNTER — Ambulatory Visit (INDEPENDENT_AMBULATORY_CARE_PROVIDER_SITE_OTHER): Payer: PPO | Admitting: Family Medicine

## 2021-05-21 VITALS — BP 119/75 | HR 62 | Temp 98.0°F | Ht 71.0 in | Wt 248.2 lb

## 2021-05-21 DIAGNOSIS — R109 Unspecified abdominal pain: Secondary | ICD-10-CM | POA: Diagnosis not present

## 2021-05-21 DIAGNOSIS — E038 Other specified hypothyroidism: Secondary | ICD-10-CM

## 2021-05-21 DIAGNOSIS — I10 Essential (primary) hypertension: Secondary | ICD-10-CM | POA: Diagnosis not present

## 2021-05-21 LAB — POCT URINALYSIS DIPSTICK
Bilirubin, UA: NEGATIVE
Blood, UA: NEGATIVE
Glucose, UA: NEGATIVE
Nitrite, UA: NEGATIVE
Protein, UA: POSITIVE — AB
Spec Grav, UA: >=1.03 — AB (ref 1.010–1.025)
Urobilinogen, UA: 0.2 E.U./dL
pH, UA: 5 (ref 5.0–8.0)

## 2021-05-21 MED ORDER — MELOXICAM 15 MG PO TABS: 15.0000 mg | ORAL_TABLET | Freq: Every day | ORAL | 0 refills | Status: DC

## 2021-05-21 NOTE — Addendum Note (Signed)
Addended by: Doran Clay A on: 05/21/2021 02:21 PM ? ? Modules accepted: Orders ? ?

## 2021-05-21 NOTE — Progress Notes (Signed)
? ?  Dale Wood is a 71 y.o. male who presents today for an office visit. ? ?Assessment/Plan:  ?New/Acute Problems: ?Left Flank pain ?Reassuring exam.  No peritoneal signs.  No signs of systemic illness.  Point-of-care urinalysis dipstick today was negative though we will send out for urine microscopy.  Possibly muscular strain.  No other localizing symptoms.  He will let me know if not improving by next week and we will get a CT scan.  We discussed reasons to return to care and seek emergent care. ? ?Chronic Problems Addressed Today: ?Hypertension ?At goal on losartan '100mg'$  daily and amlodipine 10 mg daily. ? ?Subclinical hypothyroidism ?On Synthroid 50 mcg daily.  We will need to check TSH when he comes back for CPE.  ? ? ?  ?Subjective:  ?HPI: ? ?Patient here with left flank pain. Started about 5 days ago. He is worried about a kidney stone. Radiates into back and left lower abdomen. Pain comes and goes. No fevers or chills. No nausea or vomiting. He has been taking ibuprofen.  No obvious injuries or other precipitating events.  No other specific treatments tried.  No obvious aggravating or alleviating factors.  No constipation or diarrhea.  No reported melena or hematochezia.  No rash.  ? ?   ?  ?Objective:  ?Physical Exam: ?BP 119/75 (BP Location: Left Arm)   Pulse 62   Temp 98 ?F (36.7 ?C) (Temporal)   Ht '5\' 11"'$  (1.803 m)   Wt 248 lb 3.2 oz (112.6 kg)   SpO2 95%   BMI 34.62 kg/m?   ?Gen: No acute distress, resting comfortably ?CV: Regular rate and rhythm with no murmurs appreciated ?Pulm: Normal work of breathing, clear to auscultation bilaterally with no crackles, wheezes, or rhonchi ?GI: Tenderness palpation left upper quadrant.  No rebound.  No guarding.  Bowel sounds present. ?MSK: No CVA tenderness ?Neuro: Grossly normal, moves all extremities ?Psych: Normal affect and thought content ? ?   ? ?[name]. [name], MD ?05/21/2021 2:15 PM  ?

## 2021-05-21 NOTE — Assessment & Plan Note (Signed)
At goal on losartan '100mg'$  daily and amlodipine 10 mg daily. ?

## 2021-05-21 NOTE — Assessment & Plan Note (Signed)
On Synthroid 50 mcg daily.  We will need to check TSH when he comes back for CPE.  ?

## 2021-05-21 NOTE — Patient Instructions (Signed)
It was very nice to see you today! ? ?Please start the meloxicam.  Please let me know if your symptoms worsen or are not improving by next week and we can get a CT scan. ? ?Please come back soon for your physical with labs. ? ?Take care, ?Dr [name] ? ?PLEASE NOTE: ? ?If you had any lab tests please let us know if you have not heard back within a few days. You may see your results on mychart before we have a chance to review them but we will give you a call once they are reviewed by [place]. If we ordered any referrals today, please let us know if you have not heard from their office within the next week.  ? ?Please try these tips to maintain a healthy lifestyle: ? ?Eat at least 3 REAL meals and 1-2 snacks per day.  Aim for no more than 5 hours between eating.  If you eat breakfast, please do so within one hour of getting up.  ? ?Each meal should contain half fruits/vegetables, one quarter protein, and one quarter carbs (no bigger than a computer mouse) ? ?Cut down on sweet beverages. This includes juice, soda, and sweet tea.  ? ?Drink at least 1 glass of water with each meal and aim for at least 8 glasses per day ? ?Exercise at least 150 minutes every week.   ?

## 2021-05-22 LAB — URINALYSIS, ROUTINE W REFLEX MICROSCOPIC
Bacteria, UA: NONE SEEN /HPF
Bilirubin Urine: NEGATIVE
Glucose, UA: NEGATIVE
Hgb urine dipstick: NEGATIVE
Hyaline Cast: NONE SEEN /LPF
Ketones, ur: NEGATIVE
Nitrite: NEGATIVE
Protein, ur: NEGATIVE
Specific Gravity, Urine: 1.027 (ref 1.001–1.035)
pH: 5.5 (ref 5.0–8.0)

## 2021-05-22 LAB — MICROSCOPIC MESSAGE

## 2021-05-24 NOTE — Progress Notes (Signed)
Please inform patient of the following: ? ?He had some calcium oxalate crystals in his urine. This probably explain his symptoms. He should let us know if not improving and we can get a CT scan. ? ?Dale Wood. [name], MD ?05/24/2021 3:42 PM  ?

## 2021-06-01 ENCOUNTER — Other Ambulatory Visit: Payer: Self-pay | Admitting: *Deleted

## 2021-06-01 ENCOUNTER — Telehealth: Payer: Self-pay | Admitting: Family Medicine

## 2021-06-01 DIAGNOSIS — R1013 Epigastric pain: Secondary | ICD-10-CM

## 2021-06-01 NOTE — Telephone Encounter (Signed)
See note

## 2021-06-01 NOTE — Telephone Encounter (Signed)
Please place order for non contrast abdominal CT for flank pain. ? ?Dale Wood. [name], MD ?06/01/2021 1:56 PM  ? ?

## 2021-06-01 NOTE — Telephone Encounter (Signed)
Pt called to state he is still having the left side pain on and off. [name] previously stated he would schedule a CT and pt is requesting to have it done. Please advise ?

## 2021-06-01 NOTE — Telephone Encounter (Signed)
CT order placed

## 2021-06-24 ENCOUNTER — Ambulatory Visit
Admission: RE | Admit: 2021-06-24 | Discharge: 2021-06-24 | Disposition: A | Discharge status: Home / Self Care | Payer: PPO | Source: Ambulatory Visit | Attending: Family Medicine | Admitting: Family Medicine

## 2021-06-24 DIAGNOSIS — R1013 Epigastric pain: Secondary | ICD-10-CM

## 2021-06-24 DIAGNOSIS — Z9049 Acquired absence of other specified parts of digestive tract: Secondary | ICD-10-CM | POA: Diagnosis not present

## 2021-06-24 DIAGNOSIS — K7689 Other specified diseases of liver: Secondary | ICD-10-CM | POA: Diagnosis not present

## 2021-06-24 DIAGNOSIS — N200 Calculus of kidney: Secondary | ICD-10-CM | POA: Diagnosis not present

## 2021-06-28 NOTE — Progress Notes (Signed)
Please inform patient of the following:  His CT scan showed that he does have a small kidney stone in his left kidney. There is nothing to do for it at this point. We can try flomax 0.'4mg'$  once daily to see if this helps if he wishes. HE can also use ibuprofen or naproxen as needed.  [name]. [name], MD 06/28/2021 5:34 PM

## 2021-06-29 ENCOUNTER — Telehealth: Payer: Self-pay | Admitting: Family Medicine

## 2021-06-29 NOTE — Telephone Encounter (Signed)
Patient wants a call back re ct results- patient aware dr [name] read report. Would like a call back after 3 pm today as he is still at work-

## 2021-06-30 ENCOUNTER — Other Ambulatory Visit: Payer: Self-pay | Admitting: *Deleted

## 2021-06-30 MED ORDER — TAMSULOSIN HCL 0.4 MG PO CAPS: 0.4000 mg | ORAL_CAPSULE | Freq: Every day | ORAL | 0 refills | Status: DC

## 2021-07-01 NOTE — Telephone Encounter (Signed)
See CT results note

## 2021-07-05 ENCOUNTER — Other Ambulatory Visit: Payer: Self-pay | Admitting: *Deleted

## 2021-07-05 MED ORDER — MELOXICAM 15 MG PO TABS: 15.0000 mg | ORAL_TABLET | Freq: Every day | ORAL | 0 refills | Status: DC

## 2021-07-19 ENCOUNTER — Encounter: Payer: Self-pay | Admitting: Family Medicine

## 2021-07-19 ENCOUNTER — Ambulatory Visit (INDEPENDENT_AMBULATORY_CARE_PROVIDER_SITE_OTHER): Payer: PPO | Admitting: Family Medicine

## 2021-07-19 VITALS — BP 120/68 | HR 87 | Temp 97.8°F | Ht 71.0 in | Wt 252.0 lb

## 2021-07-19 DIAGNOSIS — I10 Essential (primary) hypertension: Secondary | ICD-10-CM

## 2021-07-19 DIAGNOSIS — N390 Urinary tract infection, site not specified: Secondary | ICD-10-CM | POA: Diagnosis not present

## 2021-07-19 DIAGNOSIS — R059 Cough, unspecified: Secondary | ICD-10-CM

## 2021-07-19 MED ORDER — HYDROCHLOROTHIAZIDE 12.5 MG PO TABS: 12.5000 mg | ORAL_TABLET | Freq: Every day | ORAL | 3 refills | Status: DC

## 2021-07-19 NOTE — Assessment & Plan Note (Signed)
Blood pressure is at goal on losartan 100 mg daily and amlodipine 10 mg daily.  He is having a significant amount of lower extremity edema that is probably due to the amlodipine.  We will switch to HCTZ.  He will continue losartan 100 mg daily.  He will follow-up with me in a few weeks.  We discussed reasons return to care sooner.

## 2021-07-19 NOTE — Progress Notes (Signed)
   Dale Wood is a 71 y.o. male who presents today for an office visit.  Assessment/Plan:  New/Acute Problems: Left Flank Pain No red flags.  CT scan showed 1 mm stone.  He also had a urinalysis recently that showed calcium oxalate stones.  It is possible that he could be having ureteral spasms.  He will continue Flomax.  We will be switching his blood pressure medications to include HCTZ as below which should hopefully help reduce his calcium oxalate stones.  Encouraged good hydration.  We will recheck urinalysis and urine culture today.  If symptoms persist may consider referral back to urology.  Leg Edema Likely multifactorial although amlodipine is probably the main contributor.  We discussed regular elevation and salt avoidance.  We will be switching amlodipine to HCTZ as below.  Chronic Problems Addressed Today: Hypertension Blood pressure is at goal on losartan 100 mg daily and amlodipine 10 mg daily.  He is having a significant amount of lower extremity edema that is probably due to the amlodipine.  We will switch to HCTZ.  He will continue losartan 100 mg daily.  He will follow-up with me in a few weeks.  We discussed reasons return to care sooner.  History of positive TB test Check chest x-ray.     Subjective:  HPI:  See A/P for status of chronic conditions.  Still has intermittent left flank pain.  He had a CT scan formed a couple of weeks ago that showed 1 mm stone in his kidney.  We started him on Flomax.  Has not had much improvement.  Symptoms come and go.  Ibuprofen seems to help.  He has also had increasing swelling in bilateral lower extremities.  This has been going on for several weeks to months.  He has noticed some redness to the area as well.       Objective:  Physical Exam: BP 120/68   Pulse 87   Temp 97.8 F (36.6 C) (Temporal)   Ht '5\' 11"'$  (1.803 m)   Wt 252 lb (114.3 kg)   SpO2 92%   BMI 35.15 kg/m   Gen: No acute distress, resting  comfortably CV: Regular rate and rhythm with no murmurs appreciated Pulm: Normal work of breathing, clear to auscultation bilaterally with no crackles, wheezes, or rhonchi MSK: 2+ pitting edema to knee bilaterally.  Signs of venous stasis changes noted. Neuro: Grossly normal, moves all extremities Psych: Normal affect and thought content      [name] M. [name], MD 07/19/2021 3:06 PM

## 2021-07-19 NOTE — Patient Instructions (Addendum)
It was very nice to see you today!   We will check a urine sample today.  Please stop your amlodipine and start the HCTZ.  Please send me a message in a few weeks to let me know how this is working.  You can get your x-ray at our [place] office.  Take care, Dr [name]  PLEASE NOTE:  If you had any lab tests please let us know if you have not heard back within a few days. You may see your results on mychart before we have a chance to review them but we will give you a call once they are reviewed by [place]. If we ordered any referrals today, please let us know if you have not heard from their office within the next week.   Please try these tips to maintain a healthy lifestyle:  Eat at least 3 REAL meals and 1-2 snacks per day.  Aim for no more than 5 hours between eating.  If you eat breakfast, please do so within one hour of getting up.   Each meal should contain half fruits/vegetables, one quarter protein, and one quarter carbs (no bigger than a computer mouse)  Cut down on sweet beverages. This includes juice, soda, and sweet tea.   Drink at least 1 glass of water with each meal and aim for at least 8 glasses per day  Exercise at least 150 minutes every week.

## 2021-07-20 LAB — MICROSCOPIC MESSAGE

## 2021-07-20 LAB — URINALYSIS, ROUTINE W REFLEX MICROSCOPIC
Bacteria, UA: NONE SEEN /HPF
Bilirubin Urine: NEGATIVE
Glucose, UA: NEGATIVE
Hgb urine dipstick: NEGATIVE
Hyaline Cast: NONE SEEN /LPF
Ketones, ur: NEGATIVE
Nitrite: NEGATIVE
Specific Gravity, Urine: 1.026 (ref 1.001–1.035)
Squamous Epithelial / HPF: NONE SEEN /HPF (ref <=5)
pH: 5.5 (ref 5.0–8.0)

## 2021-07-20 LAB — URINE CULTURE
MICRO NUMBER:: 13601312
SPECIMEN QUALITY:: ADEQUATE

## 2021-07-22 NOTE — Progress Notes (Signed)
Please inform patient of the following:  He does not have any signs of urinary tract infection though still does have several small crystals in his urine.  I think this is probably causing his pain.  He can continue trying Flomax or he can follow-up with urology if not improving.  Hopefully the change was made to his blood pressure medication will help with this some as well.

## 2021-07-28 ENCOUNTER — Ambulatory Visit (INDEPENDENT_AMBULATORY_CARE_PROVIDER_SITE_OTHER)
Admission: RE | Admit: 2021-07-28 | Discharge: 2021-07-28 | Disposition: A | Discharge status: Home / Self Care | Payer: PPO | Source: Ambulatory Visit | Attending: Family Medicine | Admitting: Family Medicine

## 2021-07-28 DIAGNOSIS — R059 Cough, unspecified: Secondary | ICD-10-CM

## 2021-07-30 ENCOUNTER — Encounter: Payer: Self-pay | Admitting: Family Medicine

## 2021-07-30 NOTE — Progress Notes (Signed)
Please inform patient of the following:  Chest xray is clear.  Dale Wood. [name], MD 07/30/2021 2:47 PM

## 2021-07-30 NOTE — Telephone Encounter (Signed)
Please see result note.  His x-ray did not show any signs of infection.

## 2021-08-04 ENCOUNTER — Other Ambulatory Visit: Payer: Self-pay | Admitting: *Deleted

## 2021-08-04 MED ORDER — TAMSULOSIN HCL 0.4 MG PO CAPS: 0.4000 mg | ORAL_CAPSULE | Freq: Every day | ORAL | 0 refills | Status: DC

## 2021-08-12 DIAGNOSIS — M25562 Pain in left knee: Secondary | ICD-10-CM | POA: Diagnosis not present

## 2021-08-12 DIAGNOSIS — M1711 Unilateral primary osteoarthritis, right knee: Secondary | ICD-10-CM | POA: Diagnosis not present

## 2021-08-12 DIAGNOSIS — M1712 Unilateral primary osteoarthritis, left knee: Secondary | ICD-10-CM | POA: Diagnosis not present

## 2021-08-12 DIAGNOSIS — M25561 Pain in right knee: Secondary | ICD-10-CM | POA: Diagnosis not present

## 2021-08-12 DIAGNOSIS — M170 Bilateral primary osteoarthritis of knee: Secondary | ICD-10-CM | POA: Diagnosis not present

## 2021-08-13 ENCOUNTER — Encounter: Payer: Self-pay | Admitting: Family Medicine

## 2021-08-13 ENCOUNTER — Ambulatory Visit (INDEPENDENT_AMBULATORY_CARE_PROVIDER_SITE_OTHER): Payer: PPO | Admitting: Family Medicine

## 2021-08-13 VITALS — BP 128/62 | HR 75 | Temp 98.4°F | Ht 71.0 in | Wt 249.4 lb

## 2021-08-13 DIAGNOSIS — R600 Localized edema: Secondary | ICD-10-CM

## 2021-08-13 DIAGNOSIS — I10 Essential (primary) hypertension: Secondary | ICD-10-CM

## 2021-08-13 DIAGNOSIS — N200 Calculus of kidney: Secondary | ICD-10-CM

## 2021-08-13 MED ORDER — FUROSEMIDE 20 MG PO TABS: 20.0000 mg | ORAL_TABLET | Freq: Every day | ORAL | 3 refills | Status: DC | PRN

## 2021-08-13 MED ORDER — PHENAZOPYRIDINE HCL 200 MG PO TABS: 200.0000 mg | ORAL_TABLET | Freq: Three times a day (TID) | ORAL | 0 refills | Status: DC | PRN

## 2021-08-13 NOTE — Assessment & Plan Note (Signed)
No red flags.  Likely due to venous insufficiency.  We switched him to HCTZ at his last office visit however he has not had much improvement.  He is not currently bothered by the symptoms though is concerned about an upcoming trip that may have exacerbated symptoms.  We will send in low-dose of Lasix to use as needed.  Advised him to take potassium whenever he takes Lasix.  We discussed potential side effects of Lasix as well.  He will continue to work on conservative measures including leg elevation, salt avoidance and potentially compression stockings of symptoms worsen.

## 2021-08-13 NOTE — Progress Notes (Signed)
   Dale Wood is a 71 y.o. male who presents today for an office visit.  Assessment/Plan:  Chronic Problems Addressed Today: Leg edema No red flags.  Likely due to venous insufficiency.  We switched him to HCTZ at his last office visit however he has not had much improvement.  He is not currently bothered by the symptoms though is concerned about an upcoming trip that may have exacerbated symptoms.  We will send in low-dose of Lasix to use as needed.  Advised him to take potassium whenever he takes Lasix.  We discussed potential side effects of Lasix as well.  He will continue to work on conservative measures including leg elevation, salt avoidance and potentially compression stockings of symptoms worsen.  Hypertension Blood pressure at goal on current regimen of losartan 100 mg daily and HCTZ 12.5 mg daily.  Nephrolithiasis Noted to have calcium crystals found on UA from a couple of weeks ago.  He has not yet followed back up with urology though still does have occasional left-sided flank Wood.  We will send in prescription for Pyridium to use as needed on his trip though advised patient he should not do this for more than a few days at a time to avoid masking any significant issues.     Subjective:  HPI:  See A/P for status of chronic conditions.  He is here today because he recently saw his orthopedist and had cortisol injections.  He did well with this however his orthopedic noted that he had bilateral leg swelling and wanted him to follow-up with [place] to discuss getting him on a fluid pill.  Patient is not currently bothered by his lower extremity edema.  He is on his feet most of the day.  He tries to avoid sodium as much as possible.  He does not wear compression stockings.  No reported shortness of breath or orthopnea.  No PND.       Objective:  Physical Exam: BP 128/62   Pulse 75   Temp 98.4 F (36.9 C) (Temporal)   Ht '5\' 11"'$  (1.803 m)   Wt 249 lb 6.4 oz (113.1 kg)    SpO2 97%   BMI 34.78 kg/m   Gen: No acute distress, resting comfortably MSK: - Legs: 1+ pitting edema to midshin bilaterally Neuro: Grossly normal, moves all extremities Psych: Normal affect and thought content      Dale Blaney M. [name], MD 08/13/2021 3:17 PM

## 2021-08-13 NOTE — Assessment & Plan Note (Signed)
Noted to have calcium crystals found on UA from a couple of weeks ago.  He has not yet followed back up with urology though still does have occasional left-sided flank pain.  We will send in prescription for Pyridium to use as needed on his trip though advised patient he should not do this for more than a few days at a time to avoid masking any significant issues.

## 2021-08-13 NOTE — Patient Instructions (Signed)
It was very nice to see you today!  You can use the Lasix as needed for swelling in your legs.  If you do this please make sure that you take a potassium supplement or take plenty of fluids that are rich in potassium.  Please try keeping your legs elevated and avoiding sodium.  You can try taking the Pyridium to help with the urinary tract pain.  Please follow-up with urology soon.  Take care, Dr [name]  PLEASE NOTE:  If you had any lab tests please let us know if you have not heard back within a few days. You may see your results on mychart before we have a chance to review them but we will give you a call once they are reviewed by [place]. If we ordered any referrals today, please let us know if you have not heard from their office within the next week.   Please try these tips to maintain a healthy lifestyle:  Eat at least 3 REAL meals and 1-2 snacks per day.  Aim for no more than 5 hours between eating.  If you eat breakfast, please do so within one hour of getting up.   Each meal should contain half fruits/vegetables, one quarter protein, and one quarter carbs (no bigger than a computer mouse)  Cut down on sweet beverages. This includes juice, soda, and sweet tea.   Drink at least 1 glass of water with each meal and aim for at least 8 glasses per day  Exercise at least 150 minutes every week.

## 2021-08-13 NOTE — Assessment & Plan Note (Signed)
Blood pressure at goal on current regimen of losartan 100 mg daily and HCTZ 12.5 mg daily.

## 2021-08-20 ENCOUNTER — Other Ambulatory Visit: Payer: Self-pay | Admitting: *Deleted

## 2021-08-20 MED ORDER — LOSARTAN POTASSIUM 100 MG PO TABS: ORAL_TABLET | ORAL | 1 refills | Status: DC

## 2021-09-13 ENCOUNTER — Ambulatory Visit (INDEPENDENT_AMBULATORY_CARE_PROVIDER_SITE_OTHER): Payer: PPO | Admitting: Family

## 2021-09-13 ENCOUNTER — Encounter: Payer: Self-pay | Admitting: Family

## 2021-09-13 VITALS — BP 146/74 | HR 66 | Temp 97.7°F | Ht 71.0 in | Wt 241.6 lb

## 2021-09-13 DIAGNOSIS — J069 Acute upper respiratory infection, unspecified: Secondary | ICD-10-CM | POA: Diagnosis not present

## 2021-09-13 MED ORDER — METHYLPREDNISOLONE ACETATE 80 MG/ML IJ SUSP
80.0000 mg | Freq: Once | INTRAMUSCULAR | Status: AC
  Administered 2021-09-13: 80 mg via INTRAMUSCULAR

## 2021-09-13 MED ORDER — BENZONATATE 200 MG PO CAPS
200.0000 mg | ORAL_CAPSULE | Freq: Three times a day (TID) | ORAL | 0 refills | Status: AC | PRN
Start: 2021-09-13 — End: 2021-09-23

## 2021-09-13 NOTE — Patient Instructions (Addendum)
It was very nice to see you today!   We gave you a steroid shot to help your symptoms. I also sent over Tessalon pearles to help decrease the amount of coughing. You can take an over the counter antihistamine to help dry up the nasal mucus and/or Ibuprofen, up to '600mg'$ , 3 times per day to help with overall inflammation. Drink at least 2 liters of water daily. Call back if you are not feeling any better!      PLEASE NOTE:  If you had any lab tests please let us know if you have not heard back within a few days. You may see your results on MyChart before we have a chance to review them but we will give you a call once they are reviewed by [place]. If we ordered any referrals today, please let us know if you have not heard from their office within the next week.

## 2021-09-13 NOTE — Progress Notes (Signed)
Patient ID: Dale Wood, male    DOB: 1950-04-01, 71 y.o.   MRN: [id number]  Chief Complaint  Patient presents with   Cough    Productive. Loss voice    Nasal Congestion    HPI: Persistent cough:  started 7 days ago, sometimes productive, dark yellow mucus, a lot of nasal mucus, nasal congestion, denies sore throat or body aches, or fever. Has taken Dayquil and Nyquil with mild relief. Reports returning last week from [place], did not test for Covid at home, but has had vaccines & boosters.   Assessment & Plan:  1. Viral upper respiratory tract infection given steroid injection and sending Tessalon pearles to pharmacy, advised on use & SE. Advised to take OTC antihistamine to dry up mucus, drink 2L of water qd, ok to take Ibuprofen up to '600mg'$  tid, call back to office if sx not resolving.  - methylPREDNISolone acetate (DEPO-MEDROL) injection 80 mg - benzonatate (TESSALON) 200 MG capsule; Take 1 capsule (200 mg total) by mouth 3 (three) times daily as needed for up to 10 days for cough.  Dispense: 30 capsule; Refill: 0  Subjective:    Outpatient Medications Prior to Visit  Medication Sig Dispense Refill   furosemide (LASIX) 20 MG tablet Take 1 tablet (20 mg total) by mouth daily as needed. 30 tablet 3   hydrochlorothiazide (HYDRODIURIL) 12.5 MG tablet Take 1 tablet (12.5 mg total) by mouth daily. 90 tablet 3   levothyroxine (SYNTHROID) 50 MCG tablet TAKE 1 TABLET(50 MCG) BY MOUTH DAILY BEFORE AND BREAKFAST 90 tablet 0   losartan (COZAAR) 100 MG tablet TAKE 1 TABLET(100 MG) BY MOUTH DAILY 90 tablet 1   meloxicam (MOBIC) 15 MG tablet Take 1 tablet (15 mg total) by mouth daily. 30 tablet 0   Multiple Vitamins-Minerals (ONE-A-DAY MENS 50+) TABS Take 1 tablet by mouth daily.     phenazopyridine (PYRIDIUM) 200 MG tablet Take 1 tablet (200 mg total) by mouth 3 (three) times daily as needed for pain. 21 tablet 0   sildenafil (VIAGRA) 100 MG tablet Take 0.5-1 tablets (50-100 mg total) by  mouth daily as needed for erectile dysfunction. 30 tablet 5   tamsulosin (FLOMAX) 0.4 MG CAPS capsule Take 1 capsule (0.4 mg total) by mouth daily. 30 capsule 0   No facility-administered medications prior to visit.   Past Medical History:  Diagnosis Date   Abdominal pain, epigastric 10/27/2008   Qualifier: Diagnosis of  By: Ronnald Ramp MD, [place], RIGHT 09/22/2009   Qualifier: Diagnosis of  By: Nathaneil Canary, CMA, [name]     Basal cell carcinoma 10/29/2012   left forearm tx cx3 77f   BASAL CELL CARCINOMA, FACE 10/27/2008   Qualifier: Diagnosis of  By: [name], [name]    BASAL CELL CARCINOMA, FACE 10/27/2008   Qualifier: Diagnosis of By: [name], [name]    Cancer ([facility]    Skin Cancer on Nose   CELLULITIS, FOOT 09/22/2009   Qualifier: Diagnosis of  By: [name], [name]    Constipation 153/29/9242  DYSMETABOLIC SYNDROME 16/83/4196  Qualifier: Diagnosis of  By: [name], [place]/11/2008   Qualifier: Diagnosis of  By: [name], [name]    ERECTILE DYSFUNCTION, ORGANIC 10/27/2008   Qualifier: Diagnosis of  By: [name], [name]    Headache 07/30/2015   Heart murmur    History of kidney stones    about 30 passed  Hyperlipidemia    HYPERLIPIDEMIA 10/27/2008   Qualifier: Diagnosis of  By: Ronnald Ramp MD, [name].    Hypertension    HYPOGONADISM 11/26/2008   Qualifier: Diagnosis of  By: Ronnald Ramp MD, [name].    Hypothyroidism    Kidney stones    Low testosterone    NEPHROLITHIASIS, HX OF 10/27/2008   Qualifier: Diagnosis of  By: Ronnald Ramp MD, [name].    Nephrolithiasis, uric acid    OSTEOARTHRITIS 10/27/2008   Qualifier: Diagnosis of  By: Ronnald Ramp MD, [name].    Positive TB test    Postherpetic neuralgia 03/27/2017   Rheumatic fever    Rheumatic fever    SKIN CANCER, HX OF 10/27/2008   Qualifier: Diagnosis of  By: Ronnald Ramp MD, [name].    Squamous cell carcinoma of skin 11/27/2013   left shin tx with bx curet and 5 fu   Tachycardia 11/09/2017    Thyroid disease    Tuberculosis 1986 ish   treated   Unspecified disorder of kidney and ureter    URI 02/05/2009   Qualifier: Diagnosis of  By: Ronnald Ramp MD, [name].    Past Surgical History:  Procedure Laterality Date   CHOLECYSTECTOMY N/A 03/18/2020   Procedure: LAPAROSCOPIC CHOLECYSTECTOMY;  Surgeon: Coralie Keens, MD;  Location: [place];  Service: General;  Laterality: N/A;  60 MIN TOTAL - ROOM 10   KNEE SURGERY Right    2005, 2008. Scope for MCL tear   TONSILLECTOMY AND ADENOIDECTOMY     UMBILICAL HERNIA REPAIR N/A 03/18/2020   Procedure: HERNIA REPAIR UMBILICAL ADULT;  Surgeon: Coralie Keens, MD;  Location: [facility];  Service: General;  Laterality: N/A;   Allergies  Allergen Reactions   Androgel [Testosterone]     *Pains in Chest/Axillary area*   Penicillins     *Does Not Work*      Objective:    Physical Exam Vitals and nursing note reviewed.  Constitutional:      General: He is not in acute distress.    Appearance: Normal appearance.  HENT:     Head: Normocephalic.     Right Ear: Tympanic membrane and ear canal normal.     Left Ear: Tympanic membrane and ear canal normal.     Nose:     Right Sinus: No maxillary sinus tenderness or frontal sinus tenderness.     Left Sinus: No maxillary sinus tenderness or frontal sinus tenderness.     Mouth/Throat:     Mouth: Mucous membranes are moist.     Pharynx: Posterior oropharyngeal erythema present. No pharyngeal swelling, oropharyngeal exudate or uvula swelling.  Cardiovascular:     Rate and Rhythm: Normal rate and regular rhythm.  Pulmonary:     Effort: Pulmonary effort is normal.     Breath sounds: Normal breath sounds.  Musculoskeletal:        General: Normal range of motion.     Cervical back: Normal range of motion.  Skin:    General: Skin is warm and dry.  Neurological:     Mental Status: He is alert and oriented to person, place, and time.  Psychiatric:        Mood and Affect: Mood normal.    BP (!) 146/74    Pulse 66   Temp 97.7 F (36.5 C) (Temporal)   Ht '5\' 11"'$  (1.803 m)   Wt 241 lb 9.6 oz (109.6 kg)   SpO2 98%   BMI 33.70 kg/m  Wt Readings from Last 3 Encounters:  09/13/21 241  lb 9.6 oz (109.6 kg)  08/13/21 249 lb 6.4 oz (113.1 kg)  07/19/21 252 lb (114.3 kg)       Jeanie Sewer, NP

## 2021-09-28 ENCOUNTER — Encounter: Payer: Self-pay | Admitting: Family Medicine

## 2021-09-28 ENCOUNTER — Ambulatory Visit (INDEPENDENT_AMBULATORY_CARE_PROVIDER_SITE_OTHER): Payer: PPO | Admitting: Family Medicine

## 2021-09-28 VITALS — BP 135/79 | HR 78 | Temp 97.5°F | Ht 71.0 in | Wt 245.0 lb

## 2021-09-28 DIAGNOSIS — I10 Essential (primary) hypertension: Secondary | ICD-10-CM

## 2021-09-28 DIAGNOSIS — H9319 Tinnitus, unspecified ear: Secondary | ICD-10-CM | POA: Insufficient documentation

## 2021-09-28 DIAGNOSIS — E785 Hyperlipidemia, unspecified: Secondary | ICD-10-CM

## 2021-09-28 DIAGNOSIS — E038 Other specified hypothyroidism: Secondary | ICD-10-CM

## 2021-09-28 DIAGNOSIS — Z0001 Encounter for general adult medical examination with abnormal findings: Secondary | ICD-10-CM | POA: Diagnosis not present

## 2021-09-28 DIAGNOSIS — R739 Hyperglycemia, unspecified: Secondary | ICD-10-CM | POA: Diagnosis not present

## 2021-09-28 MED ORDER — SILDENAFIL CITRATE 100 MG PO TABS: 50.0000 mg | ORAL_TABLET | Freq: Every day | ORAL | 5 refills | Status: DC | PRN

## 2021-09-28 NOTE — Assessment & Plan Note (Signed)
At goal today.  Continue losartan 100 mg daily.

## 2021-09-28 NOTE — Assessment & Plan Note (Signed)
On Synthroid 50 mcg daily.  Check TSH. °

## 2021-09-28 NOTE — Assessment & Plan Note (Signed)
Check A1c.  Discussed lifestyle modifications. °

## 2021-09-28 NOTE — Assessment & Plan Note (Signed)
Check lipids 

## 2021-09-28 NOTE — Patient Instructions (Signed)
It was very nice to see you today!  We will check blood work today.  I will refill the sildenafil.  I will refer you to see an audiologist.  I will see back in year for your next physical.  Come back sooner if needed.  Take care, Dr [name]  PLEASE NOTE:  If you had any lab tests please let us know if you have not heard back within a few days. You may see your results on mychart before we have a chance to review them but we will give you a call once they are reviewed by [place]. If we ordered any referrals today, please let us know if you have not heard from their office within the next week.   Please try these tips to maintain a healthy lifestyle:  Eat at least 3 REAL meals and 1-2 snacks per day.  Aim for no more than 5 hours between eating.  If you eat breakfast, please do so within one hour of getting up.   Each meal should contain half fruits/vegetables, one quarter protein, and one quarter carbs (no bigger than a computer mouse)  Cut down on sweet beverages. This includes juice, soda, and sweet tea.   Drink at least 1 glass of water with each meal and aim for at least 8 glasses per day  Exercise at least 150 minutes every week.    Preventive Care 67 Years and Older, Male Preventive care refers to lifestyle choices and visits with your health care provider that can promote health and wellness. Preventive care visits are also called wellness exams. What can I expect for my preventive care visit? Counseling During your preventive care visit, your health care provider may ask about your: Medical history, including: Past medical problems. Family medical history. History of falls. Current health, including: Emotional well-being. Home life and relationship well-being. Sexual activity. Memory and ability to understand (cognition). Lifestyle, including: Alcohol, nicotine or tobacco, and drug use. Access to firearms. Diet, exercise, and sleep habits. Work and work  Statistician. Sunscreen use. Safety issues such as seatbelt and bike helmet use. Physical exam Your health care provider will check your: Height and weight. These may be used to calculate your BMI (body mass index). BMI is a measurement that tells if you are at a healthy weight. Waist circumference. This measures the distance around your waistline. This measurement also tells if you are at a healthy weight and may help predict your risk of certain diseases, such as type 2 diabetes and high blood pressure. Heart rate and blood pressure. Body temperature. Skin for abnormal spots. What immunizations do I need?  Vaccines are usually given at various ages, according to a schedule. Your health care provider will recommend vaccines for you based on your age, medical history, and lifestyle or other factors, such as travel or where you work. What tests do I need? Screening Your health care provider may recommend screening tests for certain conditions. This may include: Lipid and cholesterol levels. Diabetes screening. This is done by checking your blood sugar (glucose) after you have not eaten for a while (fasting). Hepatitis C test. Hepatitis B test. HIV (human immunodeficiency virus) test. STI (sexually transmitted infection) testing, if you are at risk. Lung cancer screening. Colorectal cancer screening. Prostate cancer screening. Abdominal aortic aneurysm (AAA) screening. You may need this if you are a current or former smoker. Talk with your health care provider about your test results, treatment options, and if necessary, the need for more tests. Follow  these instructions at home: Eating and drinking  Eat a diet that includes fresh fruits and vegetables, whole grains, lean protein, and low-fat dairy products. Limit your intake of foods with high amounts of sugar, saturated fats, and salt. Take vitamin and mineral supplements as recommended by your health care provider. Do not drink  alcohol if your health care provider tells you not to drink. If you drink alcohol: Limit how much you have to 0-2 drinks a day. Know how much alcohol is in your drink. In the [place] one drink equals one 12 oz bottle of beer (355 mL), one 5 oz glass of wine (148 mL), or one 1 oz glass of hard liquor (44 mL). Lifestyle Brush your teeth every morning and night with fluoride toothpaste. Floss one time each day. Exercise for at least 30 minutes 5 or more days each week. Do not use any products that contain nicotine or tobacco. These products include cigarettes, chewing tobacco, and vaping devices, such as e-cigarettes. If you need help quitting, ask your health care provider. Do not use drugs. If you are sexually active, practice safe sex. Use a condom or other form of protection to prevent STIs. Take aspirin only as told by your health care provider. Make sure that you understand how much to take and what form to take. Work with your health care provider to find out whether it is safe and beneficial for you to take aspirin daily. Ask your health care provider if you need to take a cholesterol-lowering medicine (statin). Find healthy ways to manage stress, such as: Meditation, yoga, or listening to music. Journaling. Talking to a trusted person. Spending time with friends and family. Safety Always wear your seat belt while driving or riding in a vehicle. Do not drive: If you have been drinking alcohol. Do not ride with someone who has been drinking. When you are tired or distracted. While texting. If you have been using any mind-altering substances or drugs. Wear a helmet and other protective equipment during sports activities. If you have firearms in your house, make sure you follow all gun safety procedures. Minimize exposure to UV radiation to reduce your risk of skin cancer. What's next? Visit your health care provider once a year for an annual wellness visit. Ask your health care  provider how often you should have your eyes and teeth checked. Stay up to date on all vaccines. This information is not intended to replace advice given to you by your health care provider. Make sure you discuss any questions you have with your health care provider. Document Revised: 07/01/2020 Document Reviewed: 07/01/2020 Elsevier Patient Education  [employer].

## 2021-09-28 NOTE — Progress Notes (Signed)
Chief Complaint:  Dale Wood is a 71 y.o. male who presents today for his annual comprehensive physical exam.    Assessment/Plan:  Chronic Problems Addressed Today: Tinnitus Has some associated hearing loss as well.  We will place referral to audiology.  Subclinical hypothyroidism On Synthroid 50 mcg daily.  Check TSH.  Hyperglycemia Check A1c.  Discussed lifestyle modifications.  Hypertension At goal today.  Continue losartan 100 mg daily.  Hyperlipidemia Check lipids.  Preventative Healthcare: Patient deferred colonoscopy.  We discussed risks and benefits of continued colon cancer screening.  Stated he would think about it.  We will check labs.  Recently had vaccines at the pharmacy-we will obtain these records.  Patient Counseling(The following topics were reviewed and/or handout was given):  -Nutrition: Stressed importance of moderation in sodium/caffeine intake, saturated fat and cholesterol, caloric balance, sufficient intake of fresh fruits, vegetables, and fiber.  -Stressed the importance of regular exercise.   -Substance Abuse: Discussed cessation/primary prevention of tobacco, alcohol, or other drug use; driving or other dangerous activities under the influence; availability of treatment for abuse.   -Injury prevention: Discussed safety belts, safety helmets, smoke detector, smoking near bedding or upholstery.   -Sexuality: Discussed sexually transmitted diseases, partner selection, use of condoms, avoidance of unintended pregnancy and contraceptive alternatives.   -Dental health: Discussed importance of regular tooth brushing, flossing, and dental visits.  -Health maintenance and immunizations reviewed. Please refer to Health maintenance section.  Return to care in 1 year for next preventative visit.     Subjective:  HPI:  He has no acute complaints today.   Lifestyle Diet: Balanced. Plenty of fruits and vegetables.  Exercise: 30 minutes of cardio 3 days  per week. Very busy with work      08/13/2021    2:55 PM  Depression screen PHQ 2/9  Decreased Interest 0  Down, Depressed, Hopeless 0  PHQ - 2 Score 0   There are no preventive care reminders to display for this patient.   ROS: Per HPI, otherwise a complete review of systems was negative.   PMH:  The following were reviewed and entered/updated in epic: Past Medical History:  Diagnosis Date   Abdominal pain, epigastric 10/27/2008   Qualifier: Diagnosis of  By: Ronnald Ramp MD, [place], RIGHT 09/22/2009   Qualifier: Diagnosis of  By: Nathaneil Canary, CMA, [name]     Basal cell carcinoma 10/29/2012   left forearm tx cx3 54f   BASAL CELL CARCINOMA, FACE 10/27/2008   Qualifier: Diagnosis of  By: [name], [name]    BASAL CELL CARCINOMA, FACE 10/27/2008   Qualifier: Diagnosis of By: [name], [name]    Cancer ([facility]    Skin Cancer on Nose   CELLULITIS, FOOT 09/22/2009   Qualifier: Diagnosis of  By: [name], [name]    Constipation 198/33/8250  DYSMETABOLIC SYNDROME 15/39/7673  Qualifier: Diagnosis of  By: [name], TAngus10/11/2008   Qualifier: Diagnosis of  By: [name], [name]    ERECTILE DYSFUNCTION, ORGANIC 10/27/2008   Qualifier: Diagnosis of  By: [name], [name]    Headache 07/30/2015   Heart murmur    History of kidney stones    about 30 passed   Hyperlipidemia    HYPERLIPIDEMIA 10/27/2008   Qualifier: Diagnosis of  By: [name], [name]    Hypertension    HYPOGONADISM 11/26/2008   Qualifier: Diagnosis of  By: [name],  Thomas L.    Hypothyroidism    Kidney stones    Low testosterone    NEPHROLITHIASIS, HX OF 10/27/2008   Qualifier: Diagnosis of  By: Ronnald Ramp MD, [name].    Nephrolithiasis, uric acid    OSTEOARTHRITIS 10/27/2008   Qualifier: Diagnosis of  By: Ronnald Ramp MD, [name].    Positive TB test    Postherpetic neuralgia 03/27/2017   Rheumatic fever    Rheumatic fever    SKIN CANCER, HX OF 10/27/2008   Qualifier:  Diagnosis of  By: Ronnald Ramp MD, [name].    Squamous cell carcinoma of skin 11/27/2013   left shin tx with bx curet and 5 fu   Tachycardia 11/09/2017   Thyroid disease    Tuberculosis 1986 ish   treated   Unspecified disorder of kidney and ureter    URI 02/05/2009   Qualifier: Diagnosis of  By: Ronnald Ramp MD, [name].    Patient Active Problem List   Diagnosis Date Noted   Tinnitus 09/28/2021   Leg edema 08/13/2021   Subclinical hypothyroidism 09/09/2019   Hyperglycemia 08/29/2019   Low back pain with sciatica 07/09/2019   Hyperlipidemia 01/05/2017   Hypertension 01/05/2017   ANKLE INJURY, RIGHT 09/22/2009   ERECTILE DYSFUNCTION, ORGANIC 10/27/2008   OSTEOARTHRITIS 10/27/2008   ABDOMINAL PAIN, EPIGASTRIC 10/27/2008   SKIN CANCER, HX OF 10/27/2008   Nephrolithiasis 10/27/2008   BASAL CELL CARCINOMA, FACE 10/27/2008   Past Surgical History:  Procedure Laterality Date   CHOLECYSTECTOMY N/A 03/18/2020   Procedure: LAPAROSCOPIC CHOLECYSTECTOMY;  Surgeon: Coralie Keens, MD;  Location: [facility];  Service: General;  Laterality: N/A;  60 MIN TOTAL - ROOM 10   KNEE SURGERY Right    2005, 2008. Scope for MCL tear   TONSILLECTOMY AND ADENOIDECTOMY     UMBILICAL HERNIA REPAIR N/A 03/18/2020   Procedure: HERNIA REPAIR UMBILICAL ADULT;  Surgeon: Coralie Keens, MD;  Location: [facility];  Service: General;  Laterality: N/A;    Family History  Problem Relation Age of Onset   Cancer Mother    Diabetes Mother    Cancer Father    Early death Father    Diabetes Brother    Cancer Brother        prostate   Drug abuse Brother    Birth defects Sister    Diabetes Maternal Grandmother    Thyroid disease Maternal Grandmother    Stroke Maternal Grandmother    Tremor Maternal Grandfather    Obesity Son     Medications- reviewed and updated Current Outpatient Medications  Medication Sig Dispense Refill   furosemide (LASIX) 20 MG tablet Take 1 tablet (20 mg total) by mouth daily as needed. 30 tablet  3   levothyroxine (SYNTHROID) 50 MCG tablet TAKE 1 TABLET(50 MCG) BY MOUTH DAILY BEFORE AND BREAKFAST 90 tablet 0   losartan (COZAAR) 100 MG tablet TAKE 1 TABLET(100 MG) BY MOUTH DAILY 90 tablet 1   Multiple Vitamins-Minerals (ONE-A-DAY MENS 50+) TABS Take 1 tablet by mouth daily.     sildenafil (VIAGRA) 100 MG tablet Take 0.5-1 tablets (50-100 mg total) by mouth daily as needed for erectile dysfunction. 30 tablet 5   No current facility-administered medications for this visit.    Allergies-reviewed and updated Allergies  Allergen Reactions   Androgel [Testosterone]     *Pains in Chest/Axillary area*   Penicillins     *Does Not Work*    Social History   Socioeconomic History   Marital status: Married    Spouse name:  Not on file   Number of children: Not on file   Years of education: Not on file   Highest education level: Not on file  Occupational History   Not on file  Tobacco Use   Smoking status: Former    Types: Cigars, Pipe    Quit date: 2001    Years since quitting: 22.7   Smokeless tobacco: Never  Vaping Use   Vaping Use: Never used  Substance and Sexual Activity   Alcohol use: Yes    Comment: 1-2 times a year   Drug use: No   Sexual activity: Yes  Other Topics Concern   Not on file  Social History Narrative   Not on file   Social Determinants of Health   Financial Resource Strain: Low Risk  (11/12/2020)   Overall Financial Resource Strain (CARDIA)    Difficulty of Paying Living Expenses: Not hard at all  Food Insecurity: No Food Insecurity (11/12/2020)   Hunger Vital Sign    Worried About Running Out of Food in the Last Year: Never true    Winter in the Last Year: Never true  Transportation Needs: No Transportation Needs (11/12/2020)   PRAPARE - Hydrologist (Medical): No    Lack of Transportation (Non-Medical): No  Physical Activity: Sufficiently Active (11/12/2020)   Exercise Vital Sign    Days of Exercise per  Week: 3 days    Minutes of Exercise per Session: 60 min  Stress: No Stress Concern Present (11/12/2020)   [place]    Feeling of Stress : Not at all  Social Connections: [place] (11/12/2020)   Social Connection and Isolation Panel [NHANES]    Frequency of Communication with Friends and Family: Once a week    Frequency of Social Gatherings with Friends and Family: More than three times a week    Attends Religious Services: More than 4 times per year    Active Member of [employer] or Organizations: Yes    Attends Archivist Meetings: 1 to 4 times per year    Marital Status: Married        Objective:  Physical Exam: BP 135/79   Pulse 78   Temp (!) 97.5 F (36.4 C) (Temporal)   Ht '5\' 11"'$  (1.803 m)   Wt 245 lb (111.1 kg)   SpO2 94%   BMI 34.17 kg/m   Body mass index is 34.17 kg/m. Wt Readings from Last 3 Encounters:  09/28/21 245 lb (111.1 kg)  09/13/21 241 lb 9.6 oz (109.6 kg)  08/13/21 249 lb 6.4 oz (113.1 kg)   Gen: NAD, resting comfortably HEENT: TMs normal bilaterally. OP clear. No thyromegaly noted.  CV: RRR with no murmurs appreciated Pulm: NWOB, CTAB with no crackles, wheezes, or rhonchi GI: Normal bowel sounds present. Soft, Nontender, Nondistended. MSK: no edema, cyanosis, or clubbing noted Skin: warm, dry Neuro: CN2-12 grossly intact. Strength 5/5 in upper and lower extremities. Reflexes symmetric and intact bilaterally.  Psych: Normal affect and thought content     Kienna Moncada M. [name], MD 09/28/2021 3:03 PM

## 2021-09-28 NOTE — Assessment & Plan Note (Signed)
Has some associated hearing loss as well.  We will place referral to audiology.

## 2021-09-29 LAB — LIPID PANEL
Cholesterol: 195 mg/dL (ref 0–200)
HDL: 36.5 mg/dL — ABNORMAL LOW (ref >39.00)
Total CHOL/HDL Ratio: 5
Triglycerides: 413 mg/dL — ABNORMAL HIGH (ref 0.0–149.0)

## 2021-09-29 LAB — TSH: TSH: 3.37 u[IU]/mL (ref 0.35–5.50)

## 2021-09-29 LAB — COMPREHENSIVE METABOLIC PANEL
ALT: 20 U/L (ref 0–53)
AST: 22 U/L (ref 0–37)
Albumin: 3.7 g/dL (ref 3.5–5.2)
Alkaline Phosphatase: 97 U/L (ref 39–117)
BUN: 21 mg/dL (ref 6–23)
CO2: 29 mEq/L (ref 19–32)
Calcium: 9.1 mg/dL (ref 8.4–10.5)
Chloride: 104 mEq/L (ref 96–112)
Creatinine, Ser: 1.23 mg/dL (ref 0.40–1.50)
GFR: 59.25 mL/min — ABNORMAL LOW (ref >60.00)
Glucose, Bld: 127 mg/dL — ABNORMAL HIGH (ref 70–99)
Potassium: 4.2 mEq/L (ref 3.5–5.1)
Sodium: 141 mEq/L (ref 135–145)
Total Bilirubin: 0.4 mg/dL (ref 0.2–1.2)
Total Protein: 6.6 g/dL (ref 6.0–8.3)

## 2021-09-29 LAB — CBC
HCT: 44.6 % (ref 39.0–52.0)
Hemoglobin: 14.9 g/dL (ref 13.0–17.0)
MCHC: 33.4 g/dL (ref 30.0–36.0)
MCV: 86.6 fl (ref 78.0–100.0)
Platelets: 311 10*3/uL (ref 150.0–400.0)
RBC: 5.14 Mil/uL (ref 4.22–5.81)
RDW: 15.2 % (ref 11.5–15.5)
WBC: 8.5 10*3/uL (ref 4.0–10.5)

## 2021-09-29 LAB — HEMOGLOBIN A1C: Hgb A1c MFr Bld: 6.4 % (ref 4.6–6.5)

## 2021-09-29 LAB — LDL CHOLESTEROL, DIRECT: Direct LDL: 108 mg/dL

## 2021-10-01 NOTE — Progress Notes (Signed)
Please inform patient of the following:  Cholesterol levels are elevated.  Recommend starting Lipitor 40 mg daily to improve numbers and lower risk of heart attack and stroke.  Please send in the patient is agreeable to start.  He should continue to work on diet and exercise regardless we can recheck in 6 to 12 months.  His blood sugar is also elevated into the prediabetic range.  No need to start any medications for this but he should continue to work on diet and exercise above.  All of his other labs are normal and we can recheck in year.

## 2021-10-06 ENCOUNTER — Other Ambulatory Visit: Payer: Self-pay | Admitting: *Deleted

## 2021-10-06 ENCOUNTER — Telehealth: Payer: Self-pay | Admitting: Family Medicine

## 2021-10-06 MED ORDER — ATORVASTATIN CALCIUM 40 MG PO TABS: 40.0000 mg | ORAL_TABLET | Freq: Every day | ORAL | 1 refills | Status: DC

## 2021-10-06 NOTE — Telephone Encounter (Signed)
Pt states: -returning a call about results.  Pt will be available for call back on cell phone.

## 2021-10-07 NOTE — Telephone Encounter (Signed)
See results note. 

## 2021-11-11 DIAGNOSIS — H903 Sensorineural hearing loss, bilateral: Secondary | ICD-10-CM | POA: Diagnosis not present

## 2021-11-28 ENCOUNTER — Encounter: Payer: Self-pay | Admitting: Family Medicine

## 2021-12-06 ENCOUNTER — Ambulatory Visit (INDEPENDENT_AMBULATORY_CARE_PROVIDER_SITE_OTHER): Payer: PPO

## 2021-12-06 ENCOUNTER — Telehealth: Payer: Self-pay | Admitting: Family Medicine

## 2021-12-06 VITALS — BP 136/82 | HR 93 | Temp 98.7°F | Wt 250.8 lb

## 2021-12-06 DIAGNOSIS — Z1211 Encounter for screening for malignant neoplasm of colon: Secondary | ICD-10-CM | POA: Diagnosis not present

## 2021-12-06 DIAGNOSIS — Z0000 Encounter for general adult medical examination without abnormal findings: Secondary | ICD-10-CM | POA: Diagnosis not present

## 2021-12-06 NOTE — Telephone Encounter (Signed)
Pt states he is needing to transfer care to another provider. He can not come in for office visits due to [facility] limited scheduled. He can not come in before 3:30 pm.He is asking to transfer to [place]. Please advise

## 2021-12-06 NOTE — Progress Notes (Signed)
Subjective:   Dale Wood is a 71 y.o. male who presents for Medicare Annual/Subsequent preventive examination.  Review of Systems     Cardiac Risk Factors include: advanced age (>42mn, >>88women);dyslipidemia;hypertension;male gender;obesity (BMI >30kg/m2)     Objective:    Today's Vitals   12/06/21 1505 12/06/21 1531  BP: (!) 142/80 136/82  Pulse: 93   Temp: 98.7 F (37.1 C)   SpO2: 94%   Weight: 250 lb 12.8 oz (113.8 kg)    Body mass index is 34.98 kg/m.     12/06/2021    3:27 PM 11/12/2020    3:20 PM 03/18/2020   12:58 PM 11/07/2019    3:16 PM 07/26/2019    2:54 PM 09/18/2018    3:28 PM 03/22/2018    1:15 AM  Advanced Directives  Does Patient Have a Medical Advance Directive? No Yes No Yes No Yes No  Type of Advance Directive      Living will;Healthcare Power of Attorney   Does patient want to make changes to medical advance directive?  No - Patient declined  Yes (MAU/Ambulatory/Procedural Areas - Information given)  No - Patient declined   Copy of [place]?      No - copy requested   Would patient like information on creating a medical advance directive? No - Patient declined  No - Patient declined  No - Patient declined  No - Patient declined    Current Medications (verified) Outpatient Encounter Medications as of 12/06/2021  Medication Sig   losartan (COZAAR) 100 MG tablet TAKE 1 TABLET(100 MG) BY MOUTH DAILY   Multiple Vitamins-Minerals (ONE-A-DAY MENS 50+) TABS Take 1 tablet by mouth daily.   PFIZER COVID-19 VAC BIVALENT injection    sildenafil (VIAGRA) 100 MG tablet Take 0.5-1 tablets (50-100 mg total) by mouth daily as needed for erectile dysfunction.   atorvastatin (LIPITOR) 40 MG tablet Take 1 tablet (40 mg total) by mouth daily. (Patient not taking: Reported on 12/06/2021)   furosemide (LASIX) 20 MG tablet Take 1 tablet (20 mg total) by mouth daily as needed. (Patient not taking: Reported on 12/06/2021)   levothyroxine  (SYNTHROID) 50 MCG tablet TAKE 1 TABLET(50 MCG) BY MOUTH DAILY BEFORE AND BREAKFAST (Patient not taking: Reported on 12/06/2021)   No facility-administered encounter medications on file as of 12/06/2021.    Allergies (verified) Androgel [testosterone] and Penicillins   History: Past Medical History:  Diagnosis Date   Abdominal pain, epigastric 10/27/2008   Qualifier: Diagnosis of  By: [name], [place] RIGHT 09/22/2009   Qualifier: Diagnosis of  By: DNathaneil Canary CMA, [name]     Basal cell carcinoma 10/29/2012   left forearm tx cx3 [id number]  BASAL CELL CARCINOMA, FACE 10/27/2008   Qualifier: Diagnosis of  By: JoRonnald RampD, ThArvid Right   BASAL CELL CARCINOMA, FACE 10/27/2008   Qualifier: Diagnosis of By: JoRonnald RampD, ThArvid Right   [facility]   [facility] Cancer on Nose   CELLULITIS, FOOT 09/22/2009   Qualifier: Diagnosis of  By: JoRonnald RampD, ThArvid Right   Constipation 1246/96/2952 DYSMETABOLIC SYNDROME 01/25/39/3244 Qualifier: Diagnosis of  By: JoRonnald RampD, [place]/11/2008   Qualifier: Diagnosis of  By: JoRonnald RampD, ThArvid Right   ERECTILE DYSFUNCTION, ORGANIC 10/27/2008   Qualifier: Diagnosis of  By: JoRonnald RampD, ThArvid Right   Headache 07/30/2015   Heart murmur  History of kidney stones    about 30 passed   Hyperlipidemia    HYPERLIPIDEMIA 10/27/2008   Qualifier: Diagnosis of  By: Ronnald Ramp MD, [name].    Hypertension    HYPOGONADISM 11/26/2008   Qualifier: Diagnosis of  By: Ronnald Ramp MD, [name].    Hypothyroidism    Kidney stones    Low testosterone    NEPHROLITHIASIS, HX OF 10/27/2008   Qualifier: Diagnosis of  By: Ronnald Ramp MD, [name].    Nephrolithiasis, uric acid    OSTEOARTHRITIS 10/27/2008   Qualifier: Diagnosis of  By: Ronnald Ramp MD, [name].    Positive TB test    Postherpetic neuralgia 03/27/2017   Rheumatic fever    Rheumatic fever    SKIN CANCER, HX OF 10/27/2008   Qualifier: Diagnosis of  By: Ronnald Ramp MD, [name].    Squamous cell carcinoma of skin 11/27/2013   left  shin tx with bx curet and 5 fu   Tachycardia 11/09/2017   Thyroid disease    Tuberculosis 1986 ish   treated   Unspecified disorder of kidney and ureter    URI 02/05/2009   Qualifier: Diagnosis of  By: Ronnald Ramp MD, [name].    Past Surgical History:  Procedure Laterality Date   CHOLECYSTECTOMY N/A 03/18/2020   Procedure: LAPAROSCOPIC CHOLECYSTECTOMY;  Surgeon: Coralie Keens, MD;  Location: [facility];  Service: General;  Laterality: N/A;  60 MIN TOTAL - ROOM 10   KNEE SURGERY Right    2005, 2008. Scope for MCL tear   TONSILLECTOMY AND ADENOIDECTOMY     UMBILICAL HERNIA REPAIR N/A 03/18/2020   Procedure: HERNIA REPAIR UMBILICAL ADULT;  Surgeon: Coralie Keens, MD;  Location: [place];  Service: General;  Laterality: N/A;   Family History  Problem Relation Age of Onset   Cancer Mother    Diabetes Mother    Cancer Father    Early death Father    Diabetes Brother    Cancer Brother        prostate   Drug abuse Brother    Birth defects Sister    Diabetes Maternal Grandmother    Thyroid disease Maternal Grandmother    Stroke Maternal Grandmother    Tremor Maternal Grandfather    Obesity Son    Social History   Socioeconomic History   Marital status: Married    Spouse name: Not on file   Number of children: Not on file   Years of education: Not on file   Highest education level: Not on file  Occupational History   Not on file  Tobacco Use   Smoking status: Former    Types: Landscape architect, Pipe    Quit date: 2001    Years since quitting: 22.8   Smokeless tobacco: Never  Vaping Use   Vaping Use: Never used  Substance and Sexual Activity   Alcohol use: Yes    Comment: 1-2 times a year   Drug use: No   Sexual activity: Yes  Other Topics Concern   Not on file  Social History Narrative   Not on file   Social Determinants of Health   Financial Resource Strain: Low Risk  (12/06/2021)   Overall Financial Resource Strain (CARDIA)    Difficulty of Paying Living Expenses: Not hard at  all  Food Insecurity: No Food Insecurity (12/06/2021)   Hunger Vital Sign    Worried About Running Out of Food in the Last Year: Never true    [place] in the Last Year:  Never true  Transportation Needs: No Transportation Needs (12/06/2021)   PRAPARE - Hydrologist (Medical): No    Lack of Transportation (Non-Medical): No  Physical Activity: Sufficiently Active (12/06/2021)   Exercise Vital Sign    Days of Exercise per Week: 3 days    Minutes of Exercise per Session: 90 min  Stress: No Stress Concern Present (12/06/2021)   [place]    Feeling of Stress : Not at all  Social Connections: [place] (12/06/2021)   Social Connection and Isolation Panel [NHANES]    Frequency of Communication with Friends and Family: Once a week    Frequency of Social Gatherings with Friends and Family: More than three times a week    Attends Religious Services: More than 4 times per year    Active Member of [employer] or Organizations: Yes    Attends Archivist Meetings: 1 to 4 times per year    Marital Status: Married    Tobacco Counseling Counseling given: Not Answered   Clinical Intake:  Pre-visit preparation completed: Yes  Pain : No/denies pain     BMI - recorded: 34.98 Nutritional Status: BMI > 30  Obese Nutritional Risks: None Diabetes: No  How often do you need to have someone help you when you read instructions, pamphlets, or other written materials from your doctor or pharmacy?: 1 - Never  Diabetic?no  Interpreter Needed?: No  Information entered by :: Charlott Rakes, LPN   Activities of Daily Living    12/06/2021    3:29 PM  In your present state of health, do you have any difficulty performing the following activities:  Hearing? 0  Vision? 0  Difficulty concentrating or making decisions? 0  Walking or climbing stairs? 0  Dressing or bathing? 0  Doing  errands, shopping? 0  Preparing Food and eating ? N  Using the Toilet? N  In the past six months, have you accidently leaked urine? N  Do you have problems with loss of bowel control? N  Managing your Medications? N  Managing your Finances? N  Housekeeping or managing your Housekeeping? N    Patient Care Team: Vivi Barrack, MD as PCP - General (Family Medicine) Josue Hector, MD as Consulting Physician (Cardiology) Juluis Rainier as Consulting Physician (Optometry) Madelin Rear, [facility] (Inactive) as Pharmacist (Pharmacist) [name], MD (Inactive) as Consulting Physician (Dermatology)  Indicate any recent Medical Services you may have received from other than [facility] providers in the past year (date may be approximate).     Assessment:   This is a routine wellness examination for Audi.  Hearing/Vision screen Hearing Screening - Comments:: Pt slight HOH  Vision Screening - Comments:: Pt follows up with Dr Idolina Primer For annual eye exams   Dietary issues and exercise activities discussed: Current Exercise Habits: Home exercise routine, Type of exercise: walking;Other - see comments;strength training/weights;stretching, Time (Minutes): > 60, Frequency (Times/Week): 3, Weekly Exercise (Minutes/Week): 0   Goals Addressed             This Visit's Progress    Patient Stated       Lose weight        Depression Screen    12/06/2021    3:20 PM 08/13/2021    2:55 PM 07/19/2021    2:32 PM 11/12/2020    3:19 PM 11/07/2019    3:14 PM 09/18/2018    3:28 PM 09/08/2017   11:47 AM  PHQ 2/9 Scores  PHQ - 2 Score 0 0 0 0 0 0 0    Fall Risk    12/06/2021    3:29 PM 08/13/2021    2:55 PM 07/19/2021    2:33 PM 11/12/2020    3:21 PM 11/07/2019    3:17 PM  [place] in the past year? 0 0 0 0 0  Number falls in past yr: 0 0 0 0 0  Injury with Fall? 0 0 0 0 0  Risk for fall due to : Impaired vision No Fall Risks No Fall Risks  Impaired vision  Follow up Falls prevention  discussed   Falls prevention discussed Falls prevention discussed    FALL RISK PREVENTION PERTAINING TO THE HOME:  Any stairs in or around the home? Yes  If so, are there any without handrails? No  Home free of loose throw rugs in walkways, pet beds, electrical cords, etc? Yes  Adequate lighting in your home to reduce risk of falls? Yes   ASSISTIVE DEVICES UTILIZED TO PREVENT FALLS:  Life alert? No  Use of a cane, walker or w/c? No  Grab bars in the bathroom? Yes  Shower chair or bench in shower? No  Elevated toilet seat or a handicapped toilet? No   TIMED UP AND GO:  Was the test performed? Yes .  Length of time to ambulate 10 feet: 10 sec.   Gait steady and fast without use of assistive device  Cognitive Function:        12/06/2021    3:29 PM 11/12/2020    3:23 PM 11/07/2019    3:19 PM 09/18/2018    3:29 PM 09/08/2017   11:52 AM  6CIT Screen  What Year? 0 points 0 points 0 points 0 points 0 points  What month? 0 points 0 points 0 points 0 points 0 points  What time? 0 points 0 points  0 points 0 points  Count back from 20 0 points 0 points 0 points 0 points 0 points  Months in reverse 0 points 0 points 0 points 0 points 0 points  Repeat phrase 0 points 0 points 0 points    Total Score 0 points 0 points       Immunizations Immunization History  Administered Date(s) Administered   Fluad Quad(high Dose 65+) 09/18/2018   Influenza, High Dose Seasonal PF 11/22/2016   Influenza-Unspecified 09/17/2016   PFIZER(Purple Top)SARS-COV-2 Vaccination 03/30/2019, 04/20/2019, 11/05/2019, 05/03/2020, 07/05/2021   Pfizer Covid-19 Vaccine Bivalent Booster 61yr & up 12/11/2020   Pneumococcal Conjugate-13 11/22/2016   Pneumococcal Polysaccharide-23 01/15/2019   Td 01/17/2001   Tdap 08/25/2010   Zoster Recombinat (Shingrix) 10/25/2019, 12/25/2019    TDAP status: Due, Education has been provided regarding the importance of this vaccine. Advised may receive this vaccine at local  pharmacy or Health Dept. Aware to provide a copy of the vaccination record if obtained from local pharmacy or Health Dept. Verbalized acceptance and understanding.  Flu Vaccine status: Due, Education has been provided regarding the importance of this vaccine. Advised may receive this vaccine at local pharmacy or Health Dept. Aware to provide a copy of the vaccination record if obtained from local pharmacy or Health Dept. Verbalized acceptance and understanding.  Pneumococcal vaccine status: Up to date  Covid-19 vaccine status: Completed vaccines  Qualifies for Shingles Vaccine? Yes   Zostavax completed Yes   Shingrix Completed?: Yes  Screening Tests Health Maintenance  Topic Date Due   COLONOSCOPY (Pts 45-454yrInsurance  coverage will need to be confirmed)  10/11/2020   COVID-19 Vaccine (7 - Pfizer risk series) 08/30/2021   INFLUENZA VACCINE  04/17/2022 (Originally 08/17/2021)   Medicare Annual Wellness (AWV)  12/07/2022   Pneumonia Vaccine 89+ Years old  Completed   Hepatitis C Screening  Completed   Zoster Vaccines- Shingrix  Completed   HPV VACCINES  Aged Out    Health Maintenance  Health Maintenance Due  Topic Date Due   COLONOSCOPY (Pts 45-74yr Insurance coverage will need to be confirmed)  10/11/2020   COVID-19 Vaccine (7 - Pfizer risk series) 08/30/2021    Colorectal cancer screening: Referral to GI placed 12/06/21. Pt aware the office will call re: appt.   Additional Screening:  Hepatitis C Screening:  Completed 11/07/15  Vision Screening: Recommended annual ophthalmology exams for early detection of glaucoma and other disorders of the eye. Is the patient up to date with their annual eye exam?  Yes  Who is the provider or what is the name of the office in which the patient attends annual eye exams? Dr DIdolina PrimerIf pt is not established with a provider, would they like to be referred to a provider to establish care? No .   Dental Screening: Recommended annual dental  exams for proper oral hygiene  Community Resource Referral / Chronic Care Management: CRR required this visit?  No   CCM required this visit?  No      Plan:     I have personally reviewed and noted the following in the patient's chart:   Medical and social history Use of alcohol, tobacco or illicit drugs  Current medications and supplements including opioid prescriptions. Patient is not currently taking opioid prescriptions. Functional ability and status Nutritional status Physical activity Advanced directives List of other physicians Hospitalizations, surgeries, and ER visits in previous 12 months Vitals Screenings to include cognitive, depression, and falls Referrals and appointments  In addition, I have reviewed and discussed with patient certain preventive protocols, quality metrics, and best practice recommendations. A written personalized care plan for preventive services as well as general preventive health recommendations were provided to patient.     TWillette Brace LPN   [id number]  Nurse Notes: Pt will make an appt to discuss medications as he is not taking as prescribed at this time.

## 2021-12-06 NOTE — Patient Instructions (Addendum)
Dale Wood , Thank you for taking time to come for your Medicare Wellness Visit. I appreciate your ongoing commitment to your health goals. Please review the following plan we discussed and let me know if I can assist you in the future.   These are the goals we discussed:  Goals      Increase physical activity     Continue 3 days a week at the [facility]. Goal of adding in the adjustable height treadmill to prepare for [place] trip     lose weight     Patient Stated     Lose 20lbs      Patient Stated     Lose weight      [place] (see longitudinal plan of care for additional care plan information)  Current Barriers:  Chronic Disease Management support, education, and care coordination needs related to Hypertension, Hyperlipidemia, and Prediabetes   Hypertension BP Readings from Last 3 Encounters:  09/09/19 (!) 149/68  08/29/19 (!) 154/82  08/28/19 (!) 150/90  Pharmacist Clinical Goal(s): Over the next 180 days, patient will work with PharmD and providers to achieve BP goal <140/90 Current regimen:  Losartan 100 mg once daily Interventions: Discussed diet/exercised recommendations, possibly starting on additional agent such as hydrochlorothiazide  Patient self care activities - Over the next 180 days, patient will: Check BP once daily, document, and provide at future appointments Ensure daily salt intake < 1800 mg/day  Hyperlipidemia Lab Results  Component Value Date/Time   [facility]  08/29/2019 03:39 PM     Comment:     . LDL cholesterol not calculated. Triglyceride levels greater than 400 mg/dL invalidate calculated LDL results. . Reference range: <100 . Desirable range <100 mg/dL for primary prevention;   <70 mg/dL for patients with CHD or diabetic patients  with > or = 2 CHD risk factors. Marland Kitchen LDL-C is now calculated using the Martin-Hopkins  calculation, which is a validated novel method providing  better accuracy than the Friedewald equation  in the  estimation of LDL-C.  Cresenciano Genre et al. Annamaria Helling. 6195;093(26): 2061-2068  (http://education.[facility].com/faq/FAQ164)    LDLDIRECT 116.0 11/22/2016 04:48 PM  Pharmacist Clinical Goal(s): Over the next 180 days, patient will work with PharmD and providers to achieve LDL goal < 70 Current regimen:  N/a Interventions: Diet/exercise recommendations Patient self care activities - Over the next 180 days, patient will: Continue current management  Prediabetes Lab Results  Component Value Date/Time   HGBA1C 6.4 (H) 08/29/2019 03:39 PM  Pharmacist Clinical Goal(s): Over the next 180 days, patient will work with PharmD and providers to maintain A1c goal <6.5% Current regimen:  N/a Interventions: Discussed diet/exercise recommendations Patient self care activities - Over the next 180 days, patient will: Check blood sugar if directed, document, and provide at future appointments Contact provider with any episodes of hypoglycemia  Medication management Pharmacist Clinical Goal(s): Over the next 180 days, patient will work with PharmD and providers to maintain optimal medication adherence [facility] Interventions Comprehensive medication review performed. Continue current medication management strategy Patient self care activities - Over the next 180 days, patient will: Take medications as prescribed Report any questions or concerns to PharmD and/or provider(s) Initial goal documentation.        This is a list of the screening recommended for you and due dates:  Health Maintenance  Topic Date Due   Colon Cancer Screening  10/11/2020   COVID-19 Vaccine (7 - Pfizer risk series)  08/30/2021   Flu Shot  04/17/2022*   Medicare Annual Wellness Visit  12/07/2022   Pneumonia Vaccine  Completed   Hepatitis C Screening: USPSTF Recommendation to screen - Ages 18-79 yo.  Completed   Zoster (Shingles) Vaccine  Completed   HPV Vaccine  Aged Out  *Topic was  postponed. The date shown is not the original due date.    Advanced directives: Advance directive discussed with you today. Even though you declined this today please call our office should you change your mind and we can give you the proper paperwork for you to fill out.  Conditions/risks identified: lose weight   Next appointment: Follow up in one year for your annual wellness visit.   Preventive Care 16 Years and Older, Male  Preventive care refers to lifestyle choices and visits with your health care provider that can promote health and wellness. What does preventive care include? A yearly physical exam. This is also called an annual well check. Dental exams once or twice a year. Routine eye exams. Ask your health care provider how often you should have your eyes checked. Personal lifestyle choices, including: Daily care of your teeth and gums. Regular physical activity. Eating a healthy diet. Avoiding tobacco and drug use. Limiting alcohol use. Practicing safe sex. Taking low doses of aspirin every day. Taking vitamin and mineral supplements as recommended by your health care provider. What happens during an annual well check? The services and screenings done by your health care provider during your annual well check will depend on your age, overall health, lifestyle risk factors, and family history of disease. Counseling  Your health care provider may ask you questions about your: Alcohol use. Tobacco use. Drug use. Emotional well-being. Home and relationship well-being. Sexual activity. Eating habits. History of falls. Memory and ability to understand (cognition). Work and work Statistician. Screening  You may have the following tests or measurements: Height, weight, and BMI. Blood pressure. Lipid and cholesterol levels. These may be checked every 5 years, or more frequently if you are over 62 years old. Skin check. Lung cancer screening. You may have this screening  every year starting at age 49 if you have a 30-pack-year history of smoking and currently smoke or have quit within the past 15 years. Fecal occult blood test (FOBT) of the stool. You may have this test every year starting at age 107. Flexible sigmoidoscopy or colonoscopy. You may have a sigmoidoscopy every 5 years or a colonoscopy every 10 years starting at age 82. Prostate cancer screening. Recommendations will vary depending on your family history and other risks. Hepatitis C blood test. Hepatitis B blood test. Sexually transmitted disease (STD) testing. Diabetes screening. This is done by checking your blood sugar (glucose) after you have not eaten for a while (fasting). You may have this done every 1-3 years. Abdominal aortic aneurysm (AAA) screening. You may need this if you are a current or former smoker. Osteoporosis. You may be screened starting at age 21 if you are at high risk. Talk with your health care provider about your test results, treatment options, and if necessary, the need for more tests. Vaccines  Your health care provider may recommend certain vaccines, such as: Influenza vaccine. This is recommended every year. Tetanus, diphtheria, and acellular pertussis (Tdap, Td) vaccine. You may need a Td booster every 10 years. Zoster vaccine. You may need this after age 26. Pneumococcal 13-valent conjugate (PCV13) vaccine. One dose is recommended after age 59. Pneumococcal polysaccharide (PPSV23) vaccine. One  dose is recommended after age 59. Talk to your health care provider about which screenings and vaccines you need and how often you need them. This information is not intended to replace advice given to you by your health care provider. Make sure you discuss any questions you have with your health care provider. Document Released: 01/30/2015 Document Revised: 09/23/2015 Document Reviewed: 11/04/2014 Elsevier Interactive Patient Education  2017 [place] Prevention in  the Home Falls can cause injuries. They can happen to people of all ages. There are many things you can do to make your home safe and to help prevent falls. What can I do on the outside of my home? Regularly fix the edges of walkways and driveways and fix any cracks. Remove anything that might make you trip as you walk through a door, such as a raised step or threshold. Trim any bushes or trees on the path to your home. Use bright outdoor lighting. Clear any walking paths of anything that might make someone trip, such as rocks or tools. Regularly check to see if handrails are loose or broken. Make sure that both sides of any steps have handrails. Any raised decks and porches should have guardrails on the edges. Have any leaves, snow, or ice cleared regularly. Use sand or salt on walking paths during winter. Clean up any spills in your garage right away. This includes oil or grease spills. What can I do in the bathroom? Use night lights. Install grab bars by the toilet and in the tub and shower. Do not use towel bars as grab bars. Use non-skid mats or decals in the tub or shower. If you need to sit down in the shower, use a plastic, non-slip stool. Keep the floor dry. Clean up any water that spills on the floor as soon as it happens. Remove soap buildup in the tub or shower regularly. Attach bath mats securely with double-sided non-slip rug tape. Do not have throw rugs and other things on the floor that can make you trip. What can I do in the bedroom? Use night lights. Make sure that you have a light by your bed that is easy to reach. Do not use any sheets or blankets that are too big for your bed. They should not hang down onto the floor. Have a firm chair that has side arms. You can use this for support while you get dressed. Do not have throw rugs and other things on the floor that can make you trip. What can I do in the kitchen? Clean up any spills right away. Avoid walking on wet  floors. Keep items that you use a lot in easy-to-reach places. If you need to reach something above you, use a strong step stool that has a grab bar. Keep electrical cords out of the way. Do not use floor polish or wax that makes floors slippery. If you must use wax, use non-skid floor wax. Do not have throw rugs and other things on the floor that can make you trip. What can I do with my stairs? Do not leave any items on the stairs. Make sure that there are handrails on both sides of the stairs and use them. Fix handrails that are broken or loose. Make sure that handrails are as long as the stairways. Check any carpeting to make sure that it is firmly attached to the stairs. Fix any carpet that is loose or worn. Avoid having throw rugs at the top or bottom of the  stairs. If you do have throw rugs, attach them to the floor with carpet tape. Make sure that you have a light switch at the top of the stairs and the bottom of the stairs. If you do not have them, ask someone to add them for you. What else can I do to help prevent falls? Wear shoes that: Do not have high heels. Have rubber bottoms. Are comfortable and fit you well. Are closed at the toe. Do not wear sandals. If you use a stepladder: Make sure that it is fully opened. Do not climb a closed stepladder. Make sure that both sides of the stepladder are locked into place. Ask someone to hold it for you, if possible. Clearly mark and make sure that you can see: Any grab bars or handrails. First and last steps. Where the edge of each step is. Use tools that help you move around (mobility aids) if they are needed. These include: Canes. Walkers. Scooters. Crutches. Turn on the lights when you go into a dark area. Replace any light bulbs as soon as they burn out. Set up your furniture so you have a clear path. Avoid moving your furniture around. If any of your floors are uneven, fix them. If there are any pets around you, be aware of  where they are. Review your medicines with your doctor. Some medicines can make you feel dizzy. This can increase your chance of falling. Ask your doctor what other things that you can do to help prevent falls. This information is not intended to replace advice given to you by your health care provider. Make sure you discuss any questions you have with your health care provider. Document Released: 10/30/2008 Document Revised: 06/11/2015 Document Reviewed: 02/07/2014 Elsevier Interactive Patient Education  2017 [employer].

## 2021-12-07 NOTE — Telephone Encounter (Signed)
Patient states he would like to keep Dr. [name] as his PCP-does not want TOC.

## 2021-12-21 DIAGNOSIS — Z1211 Encounter for screening for malignant neoplasm of colon: Secondary | ICD-10-CM | POA: Diagnosis not present

## 2021-12-29 LAB — COLOGUARD: COLOGUARD: NEGATIVE

## 2021-12-29 NOTE — Progress Notes (Signed)
Please inform patient of the following:  Great news! Cologuard is negative. We can repeat in 3 years.  Dale Wood. [name], MD 12/29/2021 10:48 AM

## 2022-01-04 ENCOUNTER — Emergency Department (HOSPITAL_BASED_OUTPATIENT_CLINIC_OR_DEPARTMENT_OTHER)
Admission: EM | Admit: 2022-01-04 | Discharge: 2022-01-05 | Disposition: A | Discharge status: Home / Self Care | Payer: PPO | Attending: Emergency Medicine | Admitting: Emergency Medicine

## 2022-01-04 ENCOUNTER — Encounter (HOSPITAL_BASED_OUTPATIENT_CLINIC_OR_DEPARTMENT_OTHER): Payer: Self-pay | Admitting: Emergency Medicine

## 2022-01-04 ENCOUNTER — Other Ambulatory Visit: Payer: Self-pay

## 2022-01-04 DIAGNOSIS — B9789 Other viral agents as the cause of diseases classified elsewhere: Secondary | ICD-10-CM | POA: Diagnosis not present

## 2022-01-04 DIAGNOSIS — Z20822 Contact with and (suspected) exposure to covid-19: Secondary | ICD-10-CM | POA: Insufficient documentation

## 2022-01-04 DIAGNOSIS — R059 Cough, unspecified: Secondary | ICD-10-CM | POA: Diagnosis not present

## 2022-01-04 DIAGNOSIS — J069 Acute upper respiratory infection, unspecified: Secondary | ICD-10-CM | POA: Diagnosis not present

## 2022-01-04 DIAGNOSIS — I10 Essential (primary) hypertension: Secondary | ICD-10-CM | POA: Insufficient documentation

## 2022-01-04 DIAGNOSIS — R062 Wheezing: Secondary | ICD-10-CM | POA: Diagnosis not present

## 2022-01-04 DIAGNOSIS — R0602 Shortness of breath: Secondary | ICD-10-CM | POA: Diagnosis not present

## 2022-01-04 DIAGNOSIS — R600 Localized edema: Secondary | ICD-10-CM | POA: Insufficient documentation

## 2022-01-04 DIAGNOSIS — R0902 Hypoxemia: Secondary | ICD-10-CM | POA: Diagnosis not present

## 2022-01-04 DIAGNOSIS — R000 Tachycardia, unspecified: Secondary | ICD-10-CM | POA: Insufficient documentation

## 2022-01-04 DIAGNOSIS — J9811 Atelectasis: Secondary | ICD-10-CM

## 2022-01-04 LAB — BASIC METABOLIC PANEL
Anion gap: 10 (ref 5–15)
BUN: 17 mg/dL (ref 8–23)
CO2: 26 mmol/L (ref 22–32)
Calcium: 9.3 mg/dL (ref 8.9–10.3)
Chloride: 102 mmol/L (ref 98–111)
Creatinine, Ser: 1.23 mg/dL (ref 0.61–1.24)
GFR, Estimated: >60 mL/min (ref >60)
Glucose, Bld: 232 mg/dL — ABNORMAL HIGH (ref 70–99)
Potassium: 4 mmol/L (ref 3.5–5.1)
Sodium: 138 mmol/L (ref 135–145)

## 2022-01-04 LAB — TROPONIN I (HIGH SENSITIVITY): Troponin I (High Sensitivity): 6 ng/L (ref <18)

## 2022-01-04 LAB — CBC WITH DIFFERENTIAL/PLATELET
Abs Immature Granulocytes: 0.02 10*3/uL (ref 0.00–0.07)
Basophils Absolute: 0.1 10*3/uL (ref 0.0–0.1)
Basophils Relative: 1 %
Eosinophils Absolute: 0 10*3/uL (ref 0.0–0.5)
Eosinophils Relative: 0 %
HCT: 48 % (ref 39.0–52.0)
Hemoglobin: 15.7 g/dL (ref 13.0–17.0)
Immature Granulocytes: 0 %
Lymphocytes Relative: 11 %
Lymphs Abs: 0.7 10*3/uL (ref 0.7–4.0)
MCH: 28.6 pg (ref 26.0–34.0)
MCHC: 32.7 g/dL (ref 30.0–36.0)
MCV: 87.4 fL (ref 80.0–100.0)
Monocytes Absolute: 0.1 10*3/uL (ref 0.1–1.0)
Monocytes Relative: 1 %
Neutro Abs: 5.9 10*3/uL (ref 1.7–7.7)
Neutrophils Relative %: 87 %
Platelets: 294 10*3/uL (ref 150–400)
RBC: 5.49 MIL/uL (ref 4.22–5.81)
RDW: 14.6 % (ref 11.5–15.5)
WBC: 6.8 10*3/uL (ref 4.0–10.5)
nRBC: 0 % (ref 0.0–0.2)

## 2022-01-04 LAB — RESP PANEL BY RT-PCR (RSV, FLU A&B, COVID)  RVPGX2
Influenza A by PCR: NEGATIVE
Influenza B by PCR: NEGATIVE
Resp Syncytial Virus by PCR: NEGATIVE
SARS Coronavirus 2 by RT PCR: NEGATIVE

## 2022-01-04 LAB — BRAIN NATRIURETIC PEPTIDE: B Natriuretic Peptide: 23.1 pg/mL (ref 0.0–100.0)

## 2022-01-04 NOTE — ED Notes (Signed)
Pt reassessed and vitals updated. Pt also updated on wait status and informed to let staff know if condition changes and/or worsens.

## 2022-01-04 NOTE — ED Notes (Signed)
Patient ambulated on Pulse Oximetry. Patient maintained Oxygen Saturation at 93% with no Change in WOB. Did endorse an Episode of Mild Dizziness that subsided quickly. Returned to [employer] and placed on Monitoring.

## 2022-01-04 NOTE — ED Provider Notes (Signed)
[facility] EMERGENCY DEPT Provider Note   CSN: [id number] Arrival date & time: 01/04/22  1720     History  Chief Complaint  Patient presents with   Cough    Dale Wood is a 71 y.o. male.  Patient is a 71 year old male with a past medical history of hypertension presenting to the emergency department with a cough.  The patient states that he has had a mildly productive cough over the last 2 weeks that has been worsening.  He states that he has had swelling in his legs for the last month.  He denies any significant chest pain.  He states that he does feel short of breath on exertion and with coughing spells.  He denies any fevers or chills, nausea, vomiting or diarrhea.  He states he was seen in urgent care today and was treated with a neb, steroids and ceftriaxone and had an x-ray that showed concern for "a partially collapsed lung" and was recommended to come to the emergency department.  The history is provided by the patient and the spouse.  Cough      Home Medications Prior to Admission medications   Medication Sig Start Date End Date Taking? Authorizing Provider  atorvastatin (LIPITOR) 40 MG tablet Take 1 tablet (40 mg total) by mouth daily. Patient not taking: Reported on 12/06/2021 10/06/21   Vivi Barrack, MD  furosemide (LASIX) 20 MG tablet Take 1 tablet (20 mg total) by mouth daily as needed. Patient not taking: Reported on 12/06/2021 08/13/21   Vivi Barrack, MD  levothyroxine (SYNTHROID) 50 MCG tablet TAKE 1 TABLET(50 MCG) BY MOUTH DAILY BEFORE AND BREAKFAST Patient not taking: Reported on 12/06/2021 12/03/20   Vivi Barrack, MD  losartan (COZAAR) 100 MG tablet TAKE 1 TABLET(100 MG) BY MOUTH DAILY 08/20/21   Vivi Barrack, MD  Multiple Vitamins-Minerals (ONE-A-DAY MENS 50+) TABS Take 1 tablet by mouth daily.    [provider]  PFIZER COVID-19 VAC BIVALENT injection  07/05/21   [provider]  sildenafil (VIAGRA) 100 MG  tablet Take 0.5-1 tablets (50-100 mg total) by mouth daily as needed for erectile dysfunction. 09/28/21   Vivi Barrack, MD      Allergies    Androgel [testosterone] and Penicillins    Review of Systems   Review of Systems  Respiratory:  Positive for cough.     Physical Exam Updated Vital Signs BP (!) 183/108   Pulse (!) 106   Temp 98.3 F (36.8 C) (Oral)   Resp 18   Ht '5\' 11"'$  (1.803 m)   Wt 109.8 kg   SpO2 93%   BMI 33.75 kg/m  Physical Exam Vitals and nursing note reviewed.  Constitutional:      General: He is not in acute distress.    Appearance: Normal appearance. He is obese.  HENT:     Head: Normocephalic and atraumatic.     Nose: Nose normal.     Mouth/Throat:     Mouth: Mucous membranes are moist.     Pharynx: Oropharynx is clear.  Eyes:     Extraocular Movements: Extraocular movements intact.     Conjunctiva/sclera: Conjunctivae normal.  Neck:     Comments: ?Mildly elevated JVD, limited due to obesity Cardiovascular:     Rate and Rhythm: Regular rhythm. Tachycardia present.     Pulses: Normal pulses.     Heart sounds: Normal heart sounds.  Pulmonary:     Effort: Pulmonary effort is normal.  Breath sounds: Normal breath sounds. No wheezing.  Abdominal:     General: Abdomen is flat.     Palpations: Abdomen is soft.     Tenderness: There is no abdominal tenderness.  Musculoskeletal:        General: Normal range of motion.     Cervical back: Normal range of motion and neck supple.     Right lower leg: Edema (2+ to mid shin) present.     Left lower leg: Edema (2+ to mid shin) present.  Skin:    General: Skin is warm and dry.  Neurological:     General: No focal deficit present.     Mental Status: He is alert and oriented to person, place, and time.  Psychiatric:        Mood and Affect: Mood normal.        Behavior: Behavior normal.     ED Results / Procedures / Treatments   Labs (all labs ordered are listed, but only abnormal results are  displayed) Labs Reviewed  BASIC METABOLIC PANEL - Abnormal; Notable for the following components:      Result Value   Glucose, Bld 232 (*)    All other components within normal limits  RESP PANEL BY RT-PCR (RSV, FLU A&B, COVID)  [id number]  BRAIN NATRIURETIC PEPTIDE  CBC WITH DIFFERENTIAL/PLATELET  TROPONIN I (HIGH SENSITIVITY)    EKG EKG Interpretation  Date/Time:  Tuesday January 04 2022 23:00:54 EST Ventricular Rate:  104 PR Interval:  177 QRS Duration: 95 QT Interval:  352 QTC Calculation: 463 R Axis:   100 Text Interpretation: Sinus tachycardia Consider left atrial enlargement ST elevation suggests acute pericarditis No si No significant change since last tracing Confirmed by Leanord Asal ([id number]) on 01/04/2022 11:58:16 PM  Radiology No results found.  Procedures Procedures    Medications Ordered in ED Medications - No data to display  ED Course/ Medical Decision Making/ A&P Clinical Course as of 01/05/22 0006  Tue Jan 04, 2022  2330 Patient signed out to Dr. Pearline Cables pending labs and reassessment. [VK]    Clinical Course User Index [VK] Kemper Durie, DO                           Medical Decision Making This patient presents to the ED with chief complaint(s) of cough with pertinent past medical history of HTN which further complicates the presenting complaint. The complaint involves an extensive differential diagnosis and also carries with it a high risk of complications and morbidity.    The differential diagnosis includes viral syndrome, new onset CHF, pulmonary edema, pleural effusion, pneumonia  Additional history obtained: Additional history obtained from family Records reviewed urgent care records  ED Course and Reassessment: I reviewed patient urgent care records with x-ray read that showed concern for possible compressive atelectasis but had no evidence of any lobar collapse or consolidations.  Patient does have some pitting lower extremity edema  and questionable JVD and will have workup to evaluate for new onset CHF as cause of his cough as well as a viral panel.  Independent labs interpretation:  The following labs were independently interpreted: pending  Independent visualization of imaging: N/A     Amount and/or Complexity of Data Reviewed Labs: ordered.          Final Clinical Impression(s) / ED Diagnoses Final diagnoses:  None    Rx / DC Orders ED Discharge Orders     None  Leanord Asal K, DO 01/05/22 0006

## 2022-01-04 NOTE — ED Provider Notes (Signed)
11:05 PM Patient signed out to me by previous ED physician. Pt is a 71 yo male with pmh of HTN presenting from urgent care for cough with atelectasis on cxr. PE concerning for pitting edema and JVD.   Labs pending to r/o CHF    Physical Exam  BP (!) 183/108   Pulse (!) 106   Temp 98.3 F (36.8 C) (Oral)   Resp 18   Ht '5\' 11"'$  (1.803 m)   Wt 109.8 kg   SpO2 93%   BMI 33.75 kg/m   Physical Exam Vitals and nursing note reviewed.  Constitutional:      Appearance: Normal appearance.  Cardiovascular:     Rate and Rhythm: Normal rate.  Pulmonary:     Effort: Pulmonary effort is normal.  Neurological:     Mental Status: He is alert.     Procedures  Procedures  ED Course / MDM   Clinical Course as of 01/05/22 0045  Tue Jan 04, 2022  2330 Patient signed out to Dr. Pearline Cables pending labs and reassessment. [VK]    Clinical Course User Index [VK] Kemper Durie, DO   Medical Decision Making Amount and/or Complexity of Data Reviewed Labs: ordered.   Labs demonstrate stable BNP and CXR. Low suspicion acute CHF exacerbation. No leukocytosis or signs/symptoms of sepsis. Patient is otherwise well appearing and requesting DC at this time.   Patient in no distress and overall condition improved here in the ED. Detailed discussions were had with the patient regarding current findings, and need for close f/u with PCP or on call doctor. The patient has been instructed to return immediately if the symptoms worsen in any way for re-evaluation. Patient verbalized understanding and is in agreement with current care plan. All questions answered prior to discharge.        Campbell Stall P, DO 26/33/35 [phone]

## 2022-01-04 NOTE — ED Triage Notes (Signed)
Pt via pov from home with cough and congestion x 1 week. P twas seen earlier today at [facility] and was told his O2 was low and he had a lung that was in "partial collapse." Pt was treated with solu-medrol and rocephin at [facility]. While he was wheezing earlier, that seems to have resolved.  UC suggested he get a CT scan. Pt alert & oriented, nad noted.

## 2022-01-04 NOTE — ED Provider Notes (Incomplete)
[facility] EMERGENCY DEPT Provider Note   CSN: [id number] Arrival date & time: 01/04/22  1720     History {Add pertinent medical, surgical, social history, OB history to HPI:1} Chief Complaint  Patient presents with  . Cough    Dale Wood is a 71 y.o. male.  Patient is a 71 year old male with a past medical history of hypertension presenting to the emergency department with a cough.  The patient states that he has had a mildly productive cough over the last 2 weeks that has been worsening.  He states that he has had swelling in his legs for the last month.  He denies any significant chest pain.  He states that he does feel short of breath on exertion and with coughing spells.  He denies any fevers or chills, nausea, vomiting or diarrhea.  He states he was seen in urgent care today and was treated with a neb, steroids and ceftriaxone and had an x-ray that showed concern for "a partially collapsed lung" and was recommended to come to the emergency department.  The history is provided by the patient and the spouse.  Cough      Home Medications Prior to Admission medications   Medication Sig Start Date End Date Taking? Authorizing Provider  atorvastatin (LIPITOR) 40 MG tablet Take 1 tablet (40 mg total) by mouth daily. Patient not taking: Reported on 12/06/2021 10/06/21   Vivi Barrack, MD  furosemide (LASIX) 20 MG tablet Take 1 tablet (20 mg total) by mouth daily as needed. Patient not taking: Reported on 12/06/2021 08/13/21   Vivi Barrack, MD  levothyroxine (SYNTHROID) 50 MCG tablet TAKE 1 TABLET(50 MCG) BY MOUTH DAILY BEFORE AND BREAKFAST Patient not taking: Reported on 12/06/2021 12/03/20   Vivi Barrack, MD  losartan (COZAAR) 100 MG tablet TAKE 1 TABLET(100 MG) BY MOUTH DAILY 08/20/21   Vivi Barrack, MD  Multiple Vitamins-Minerals (ONE-A-DAY MENS 50+) TABS Take 1 tablet by mouth daily.    [provider]  PFIZER COVID-19 VAC BIVALENT injection   07/05/21   [provider]  sildenafil (VIAGRA) 100 MG tablet Take 0.5-1 tablets (50-100 mg total) by mouth daily as needed for erectile dysfunction. 09/28/21   Vivi Barrack, MD      Allergies    Androgel [testosterone] and Penicillins    Review of Systems   Review of Systems  Respiratory:  Positive for cough.     Physical Exam Updated Vital Signs BP (!) 183/108   Pulse (!) 106   Temp 98.3 F (36.8 C) (Oral)   Resp 18   Ht '5\' 11"'$  (1.803 m)   Wt 109.8 kg   SpO2 93%   BMI 33.75 kg/m  Physical Exam Vitals and nursing note reviewed.  Constitutional:      General: He is not in acute distress.    Appearance: Normal appearance. He is obese.  HENT:     Head: Normocephalic and atraumatic.     Nose: Nose normal.     Mouth/Throat:     Mouth: Mucous membranes are moist.     Pharynx: Oropharynx is clear.  Eyes:     Extraocular Movements: Extraocular movements intact.     Conjunctiva/sclera: Conjunctivae normal.  Neck:     Comments: ?Mildly elevated JVD, limited due to obesity Cardiovascular:     Rate and Rhythm: Regular rhythm. Tachycardia present.     Pulses: Normal pulses.     Heart sounds: Normal heart sounds.  Pulmonary:  Effort: Pulmonary effort is normal.     Breath sounds: Normal breath sounds. No wheezing.  Abdominal:     General: Abdomen is flat.     Palpations: Abdomen is soft.     Tenderness: There is no abdominal tenderness.  Musculoskeletal:        General: Normal range of motion.     Cervical back: Normal range of motion and neck supple.     Right lower leg: Edema (2+ to mid shin) present.     Left lower leg: Edema (2+ to mid shin) present.  Skin:    General: Skin is warm and dry.  Neurological:     General: No focal deficit present.     Mental Status: He is alert and oriented to person, place, and time.  Psychiatric:        Mood and Affect: Mood normal.        Behavior: Behavior normal.     ED Results / Procedures / Treatments    Labs (all labs ordered are listed, but only abnormal results are displayed) Labs Reviewed  BASIC METABOLIC PANEL - Abnormal; Notable for the following components:      Result Value   Glucose, Bld 232 (*)    All other components within normal limits  RESP PANEL BY RT-PCR (RSV, FLU A&B, COVID)  [id number]  BRAIN NATRIURETIC PEPTIDE  CBC WITH DIFFERENTIAL/PLATELET  TROPONIN I (HIGH SENSITIVITY)    EKG None  Radiology No results found.  Procedures Procedures  {Document cardiac monitor, telemetry assessment procedure when appropriate:1}  Medications Ordered in ED Medications - No data to display  ED Course/ Medical Decision Making/ A&P                           Medical Decision Making Amount and/or Complexity of Data Reviewed Labs: ordered.   ***  {Document critical care time when appropriate:1} {Document review of labs and clinical decision tools ie heart score, Chads2Vasc2 etc:1}  {Document your independent review of radiology images, and any outside records:1} {Document your discussion with family members, caretakers, and with consultants:1} {Document social determinants of health affecting pt's care:1} {Document your decision making why or why not admission, treatments were needed:1} Final Clinical Impression(s) / ED Diagnoses Final diagnoses:  None    Rx / DC Orders ED Discharge Orders     None

## 2022-01-11 ENCOUNTER — Telehealth: Payer: Self-pay | Admitting: Family Medicine

## 2022-01-11 NOTE — Telephone Encounter (Signed)
Patient states: - He has an OV with Dr. Randol Kern on 12/28 but was informed if his CO machine number fell below [phone] he needs to see an MD   I informed pt that due to not having any appointments today, going to the ED or UC would be the best option. Pt verbalized understanding and call was disconnected.

## 2022-01-12 ENCOUNTER — Ambulatory Visit: Payer: PPO | Admitting: Internal Medicine

## 2022-01-13 ENCOUNTER — Ambulatory Visit (INDEPENDENT_AMBULATORY_CARE_PROVIDER_SITE_OTHER): Payer: PPO | Admitting: Internal Medicine

## 2022-01-13 ENCOUNTER — Encounter (HOSPITAL_BASED_OUTPATIENT_CLINIC_OR_DEPARTMENT_OTHER): Payer: Self-pay | Admitting: Emergency Medicine

## 2022-01-13 ENCOUNTER — Encounter: Payer: Self-pay | Admitting: Internal Medicine

## 2022-01-13 ENCOUNTER — Emergency Department (HOSPITAL_BASED_OUTPATIENT_CLINIC_OR_DEPARTMENT_OTHER)
Admission: EM | Admit: 2022-01-13 | Discharge: 2022-01-14 | Disposition: A | Discharge status: Home / Self Care | Payer: PPO | Attending: Emergency Medicine | Admitting: Emergency Medicine

## 2022-01-13 ENCOUNTER — Emergency Department (HOSPITAL_BASED_OUTPATIENT_CLINIC_OR_DEPARTMENT_OTHER): Payer: PPO | Admitting: Radiology

## 2022-01-13 ENCOUNTER — Other Ambulatory Visit: Payer: Self-pay

## 2022-01-13 VITALS — BP 130/71 | HR 85 | Temp 98.7°F | Wt 243.4 lb

## 2022-01-13 DIAGNOSIS — Z79899 Other long term (current) drug therapy: Secondary | ICD-10-CM | POA: Diagnosis not present

## 2022-01-13 DIAGNOSIS — E039 Hypothyroidism, unspecified: Secondary | ICD-10-CM | POA: Diagnosis not present

## 2022-01-13 DIAGNOSIS — J189 Pneumonia, unspecified organism: Secondary | ICD-10-CM | POA: Diagnosis not present

## 2022-01-13 DIAGNOSIS — R059 Cough, unspecified: Secondary | ICD-10-CM | POA: Diagnosis present

## 2022-01-13 DIAGNOSIS — Z87891 Personal history of nicotine dependence: Secondary | ICD-10-CM | POA: Insufficient documentation

## 2022-01-13 DIAGNOSIS — R601 Generalized edema: Secondary | ICD-10-CM | POA: Insufficient documentation

## 2022-01-13 DIAGNOSIS — R600 Localized edema: Secondary | ICD-10-CM | POA: Insufficient documentation

## 2022-01-13 DIAGNOSIS — R0602 Shortness of breath: Secondary | ICD-10-CM | POA: Diagnosis not present

## 2022-01-13 DIAGNOSIS — Z85828 Personal history of other malignant neoplasm of skin: Secondary | ICD-10-CM | POA: Diagnosis not present

## 2022-01-13 DIAGNOSIS — I10 Essential (primary) hypertension: Secondary | ICD-10-CM | POA: Diagnosis not present

## 2022-01-13 DIAGNOSIS — J9601 Acute respiratory failure with hypoxia: Secondary | ICD-10-CM

## 2022-01-13 DIAGNOSIS — J9811 Atelectasis: Secondary | ICD-10-CM | POA: Diagnosis not present

## 2022-01-13 LAB — BASIC METABOLIC PANEL
Anion gap: 8 (ref 5–15)
BUN: 17 mg/dL (ref 8–23)
CO2: 31 mmol/L (ref 22–32)
Calcium: 9.9 mg/dL (ref 8.9–10.3)
Chloride: 104 mmol/L (ref 98–111)
Creatinine, Ser: 1.28 mg/dL — ABNORMAL HIGH (ref 0.61–1.24)
GFR, Estimated: 60 mL/min — ABNORMAL LOW (ref >60)
Glucose, Bld: 154 mg/dL — ABNORMAL HIGH (ref 70–99)
Potassium: 4 mmol/L (ref 3.5–5.1)
Sodium: 143 mmol/L (ref 135–145)

## 2022-01-13 LAB — CBC
HCT: 51.2 % (ref 39.0–52.0)
Hemoglobin: 16.4 g/dL (ref 13.0–17.0)
MCH: 28.4 pg (ref 26.0–34.0)
MCHC: 32 g/dL (ref 30.0–36.0)
MCV: 88.7 fL (ref 80.0–100.0)
Platelets: 333 10*3/uL (ref 150–400)
RBC: 5.77 MIL/uL (ref 4.22–5.81)
RDW: 14.8 % (ref 11.5–15.5)
WBC: 9.4 10*3/uL (ref 4.0–10.5)
nRBC: 0 % (ref 0.0–0.2)

## 2022-01-13 LAB — BRAIN NATRIURETIC PEPTIDE: B Natriuretic Peptide: 13.9 pg/mL (ref 0.0–100.0)

## 2022-01-13 MED ORDER — DOXYCYCLINE HYCLATE 100 MG PO TABS: 100.0000 mg | ORAL_TABLET | Freq: Two times a day (BID) | ORAL | 0 refills | Status: DC

## 2022-01-13 MED ORDER — FUROSEMIDE 40 MG PO TABS: 40.0000 mg | ORAL_TABLET | Freq: Every day | ORAL | 3 refills | Status: DC

## 2022-01-13 NOTE — ED Provider Notes (Incomplete)
[facility] Provider Note: Dale Spurling, MD, FACEP  CSN: [id number] MRN: [id number] ARRIVAL: 01/13/22 at [place]: [id number]   CHIEF COMPLAINT  Cough   HISTORY OF PRESENT ILLNESS  01/13/22 11:58 PM Dale Wood is a 71 y.o. male who was seen in the ED on 01/05/2019 2:23 weeks of mildly productive cough.  The cough was worsening and he was seen earlier that day in urgent care.  He was treated with neb treatment, steroids and ceftriaxone and had a chest x-ray concerning for "a partially collapsed lung".  He was sent to the emergency department where chest x-ray was found to show atelectasis.  He was negative for COVID, influenza and RSV.  Brain natruretic peptide and troponin were normal.     Past Medical History:  Diagnosis Date  . Abdominal pain, epigastric 10/27/2008   Qualifier: Diagnosis of  By: Ronnald Ramp MD, [name].   Marland Kitchen ANKLE INJURY, RIGHT 09/22/2009   Qualifier: Diagnosis of  By: Nathaneil Canary, CMA, [name]    . Basal cell carcinoma 10/29/2012   left forearm tx cx3 48f  . BASAL CELL CARCINOMA, FACE 10/27/2008   Qualifier: Diagnosis of  By: [name], [name]   .Marland KitchenBASAL CELL CARCINOMA, FACE 10/27/2008   Qualifier: Diagnosis of By: [name], [name]   . [place]    Skin Cancer on Nose  . CELLULITIS, FOOT 09/22/2009   Qualifier: Diagnosis of  By: [name], [name]   . Constipation 01/05/2017  . DYSMETABOLIC SYNDROME 15/80/9983  Qualifier: Diagnosis of  By: [name], [place]2008   Qualifier: Diagnosis of  By: [name], [name]   . ERECTILE DYSFUNCTION, ORGANIC 10/27/2008   Qualifier: Diagnosis of  By: [name], [name]   . Headache 07/30/2015  . Heart murmur   . History of kidney stones    about 30 passed  . Hyperlipidemia   . HYPERLIPIDEMIA 10/27/2008   Qualifier: Diagnosis of  By: [name], [name]   . Hypertension   . HYPOGONADISM 11/26/2008   Qualifier: Diagnosis of  By: [name], [name]   . Hypothyroidism   . Kidney  stones   . Low testosterone   . NEPHROLITHIASIS, HX OF 10/27/2008   Qualifier: Diagnosis of  By: [name], [name]   . Nephrolithiasis, uric acid   . OSTEOARTHRITIS 10/27/2008   Qualifier: Diagnosis of  By: [name], [name]   .Marland KitchenPositive TB test   . Postherpetic neuralgia 03/27/2017  . Rheumatic fever   . Rheumatic fever   . SKIN CANCER, HX OF 10/27/2008   Qualifier: Diagnosis of  By: [name], [name]   . Squamous cell carcinoma of skin 11/27/2013   left shin tx with bx curet and 5 fu  . Tachycardia 11/09/2017  . Thyroid disease   . Tuberculosis 1986 ish   treated  . Unspecified disorder of kidney and ureter   . URI 02/05/2009   Qualifier: Diagnosis of  By: [name], [name]     Past Surgical History:  Procedure Laterality Date  . CHOLECYSTECTOMY N/A 03/18/2020   Procedure: LAPAROSCOPIC CHOLECYSTECTOMY;  Surgeon: BCoralie Keens MD;  [facility]  Service: General;  Laterality: N/A;  60 MIN TOTAL - ROOM 10  . KNEE SURGERY Right    2005, 2008. Scope for MCL tear  . TONSILLECTOMY AND ADENOIDECTOMY    . UMBILICAL HERNIA REPAIR N/A 03/18/2020   Procedure: HERNIA REPAIR UMBILICAL  ADULT;  Surgeon: Coralie Keens, MD;  Location: [facility] OR;  Service: General;  Laterality: N/A;    Family History  Problem Relation Age of Onset  . Cancer Mother   . Diabetes Mother   . Cancer Father   . Early death Father   . Diabetes Brother   . Cancer Brother        prostate  . Drug abuse Brother   . Birth defects Sister   . Diabetes Maternal Grandmother   . Thyroid disease Maternal Grandmother   . Stroke Maternal Grandmother   . Tremor Maternal Grandfather   . Obesity Son     Social History   Tobacco Use  . Smoking status: Former    Types: Cigars, Pipe    Quit date: 2001    Years since quitting: 23.0  . Smokeless tobacco: Never  Vaping Use  . Vaping Use: Never used  Substance Use Topics  . Alcohol use: Yes    Comment: 1-2 times a year  . Drug use: No    Prior to  Admission medications   Medication Sig Start Date End Date Taking? Authorizing Provider  atorvastatin (LIPITOR) 40 MG tablet Take 1 tablet (40 mg total) by mouth daily. Patient not taking: Reported on 12/06/2021 10/06/21   Vivi Barrack, MD  doxycycline (VIBRA-TABS) 100 MG tablet Take 1 tablet (100 mg total) by mouth 2 (two) times daily. 01/13/22   Loralee Pacas, MD  furosemide (LASIX) 40 MG tablet Take 1 tablet (40 mg total) by mouth daily. 01/13/22   Loralee Pacas, MD  levothyroxine (SYNTHROID) 50 MCG tablet TAKE 1 TABLET(50 MCG) BY MOUTH DAILY BEFORE AND BREAKFAST Patient not taking: Reported on 12/06/2021 12/03/20   Vivi Barrack, MD  losartan (COZAAR) 100 MG tablet TAKE 1 TABLET(100 MG) BY MOUTH DAILY 08/20/21   Vivi Barrack, MD  Multiple Vitamins-Minerals (ONE-A-DAY MENS 50+) TABS Take 1 tablet by mouth daily.    [provider]  PFIZER COVID-19 VAC BIVALENT injection  07/05/21   [provider]  sildenafil (VIAGRA) 100 MG tablet Take 0.5-1 tablets (50-100 mg total) by mouth daily as needed for erectile dysfunction. 09/28/21   Vivi Barrack, MD    Allergies Androgel [testosterone] and Penicillins   REVIEW OF SYSTEMS  Negative except as noted here or in the History of Present Illness.   PHYSICAL EXAMINATION  Initial Vital Signs Blood pressure (!) 196/106, pulse 71, temperature 98 F (36.7 C), temperature source Oral, resp. rate (!) 23, weight 110 kg, SpO2 91 %.  Examination General: Well-developed, well-nourished male in no acute distress; appearance consistent with age of record HENT: normocephalic; atraumatic Eyes: pupils equal, round and reactive to light; extraocular muscles intact Neck: supple Heart: regular rate and rhythm; no murmurs, rubs or gallops Lungs: clear to auscultation bilaterally Abdomen: soft; nondistended; nontender; no masses or hepatosplenomegaly; bowel sounds present Extremities: No deformity; full range of motion; pulses  normal Neurologic: Awake, alert and oriented; motor function intact in all extremities and symmetric; no facial droop Skin: Warm and dry Psychiatric: Normal mood and affect   RESULTS  Summary of this visit's results, reviewed and interpreted by myself:   EKG Interpretation  Date/Time:    Ventricular Rate:    PR Interval:    QRS Duration:   QT Interval:    QTC Calculation:   R Axis:     Text Interpretation:         Laboratory Studies: Results for orders placed or performed during  the hospital encounter of 01/13/22 (from the past 24 hour(s))  Basic metabolic panel     Status: Abnormal   Collection Time: 01/13/22  6:49 PM  Result Value Ref Range   Sodium 143 135 - 145 mmol/L   Potassium 4.0 3.5 - 5.1 mmol/L   Chloride 104 98 - 111 mmol/L   CO2 31 22 - 32 mmol/L   Glucose, Bld 154 (H) 70 - 99 mg/dL   BUN 17 8 - 23 mg/dL   Creatinine, Ser 1.28 (H) 0.61 - 1.24 mg/dL   Calcium 9.9 8.9 - 10.3 mg/dL   GFR, Estimated 60 (L) >60 mL/min   Anion gap 8 5 - 15  CBC     Status: None   Collection Time: 01/13/22  6:49 PM  Result Value Ref Range   WBC 9.4 4.0 - 10.5 K/uL   RBC 5.77 4.22 - 5.81 MIL/uL   Hemoglobin 16.4 13.0 - 17.0 g/dL   HCT 51.2 39.0 - 52.0 %   MCV 88.7 80.0 - 100.0 fL   MCH 28.4 26.0 - 34.0 pg   MCHC 32.0 30.0 - 36.0 g/dL   RDW 14.8 11.5 - 15.5 %   Platelets 333 150 - 400 K/uL   nRBC 0.0 0.0 - 0.2 %  Brain natriuretic peptide     Status: None   Collection Time: 01/13/22  6:49 PM  Result Value Ref Range   B Natriuretic Peptide 13.9 0.0 - 100.0 pg/mL   Imaging Studies: DG Chest 2 View  Result Date: 01/13/2022 CLINICAL DATA:  Shortness of breath EXAM: CHEST - 2 VIEW COMPARISON:  07/28/2021 FINDINGS: Stable cardiomediastinal silhouette. Left basilar atelectasis. No pleural effusion or pneumothorax. IMPRESSION: Left basilar atelectasis.  No active cardiopulmonary disease. Electronically Signed   By: Placido Sou M.D.   On: 01/13/2022 19:12    ED COURSE and  MDM  Nursing notes, initial and subsequent vitals signs, including pulse oximetry, reviewed and interpreted by myself.  Vitals:   01/13/22 1842 01/13/22 1846 01/13/22 2217 01/13/22 2300  BP: (!) 172/91  (!) 148/79 (!) 196/106  Pulse: 95  80 71  Resp: (!) 21  19 (!) 23  Temp: 98 F (36.7 C)     TempSrc: Oral     SpO2: 95%  94% 91%  Weight:  110 kg     Medications - No data to display    PROCEDURES  Procedures   ED DIAGNOSES  No diagnosis found.

## 2022-01-13 NOTE — ED Triage Notes (Signed)
Pt was seen at pcp today for cough and decreased oxygen. Said oxygen stayed between 88-91% on Room air. PCP stated fluid on lungs and edema in lower legs. Pt was seen a little over a week ago at PCP and sent to ER for CT scan but did not get one. Pt states went back to PCP today and was told to come for CT scan. Pt denies pain. States SOB with coughing.

## 2022-01-13 NOTE — ED Provider Notes (Signed)
[facility] Provider Note: Georgena Spurling, MD, FACEP  CSN: [id number] MRN: [id number] ARRIVAL: 01/13/22 at [place]: [id number]   CHIEF COMPLAINT  Cough   HISTORY OF PRESENT ILLNESS  01/13/22 11:58 PM Dale Wood is a 71 y.o. male who was seen in the ED on 01/05/2019 2:23 weeks of mildly productive cough.  The cough was worsening and he was seen earlier that day in urgent care.  He was treated with neb treatment, steroids and ceftriaxone and had a chest x-ray concerning for "a partially collapsed lung".  He was sent to the emergency department where chest x-ray was found to show atelectasis.  He was negative for COVID, influenza and RSV.  Brain natruretic peptide and troponin were normal.  The cough has persisted and is worse when lying supine. He was seen by his PCP, Berniece Pap, today who noted that his oxygen saturation went as low as 88%, especially with exertion.  He advised him to come to the ED for a CT angio chest to evaluate for pulmonary embolism.  He also wanted hepatic panel done to assess liver function values.  In addition, Dr. Randol Kern intends to treat the patient with home oxygen and doxycycline for possible pneumonia.  The patient has bilateral lower extremity edema but states he has had that for a prolonged period of time.  The patient's oxygen saturation on my evaluation is 95% but he is sitting up and has been at rest.  His oxygen saturation has been as low as 91% in the ED.    Past Medical History:  Diagnosis Date   Abdominal pain, epigastric 10/27/2008   Qualifier: Diagnosis of  By: Ronnald Ramp MD, [place], RIGHT 09/22/2009   Qualifier: Diagnosis of  By: Nathaneil Canary, CMA, [name]     Basal cell carcinoma 10/29/2012   left forearm tx cx3 37f   BASAL CELL CARCINOMA, FACE 10/27/2008   Qualifier: Diagnosis of  By: [name], [name]    BASAL CELL CARCINOMA, FACE 10/27/2008   Qualifier: Diagnosis of By: [name], [name]    Cancer ([facility]     Skin Cancer on Nose   CELLULITIS, FOOT 09/22/2009   Qualifier: Diagnosis of  By: [name], [name]    Constipation 145/80/9983  DYSMETABOLIC SYNDROME 13/82/5053  Qualifier: Diagnosis of  By: [name], TCobb10/11/2008   Qualifier: Diagnosis of  By: [name], [name]    ERECTILE DYSFUNCTION, ORGANIC 10/27/2008   Qualifier: Diagnosis of  By: [name], [name]    Headache 07/30/2015   Heart murmur    History of kidney stones    about 30 passed   Hyperlipidemia    HYPERLIPIDEMIA 10/27/2008   Qualifier: Diagnosis of  By: [name], [name]    Hypertension    HYPOGONADISM 11/26/2008   Qualifier: Diagnosis of  By: [name], [name]    Hypothyroidism    Kidney stones    Low testosterone    NEPHROLITHIASIS, HX OF 10/27/2008   Qualifier: Diagnosis of  By: [name], [name]    Nephrolithiasis, uric acid    OSTEOARTHRITIS 10/27/2008   Qualifier: Diagnosis of  By: [name], [name]    Positive TB test    Postherpetic neuralgia 03/27/2017   Rheumatic fever    Rheumatic fever    SKIN CANCER, HX OF 10/27/2008   Qualifier: Diagnosis of  By: [name], [name]  Squamous cell carcinoma of skin 11/27/2013   left shin tx with bx curet and 5 fu   Tachycardia 11/09/2017   Thyroid disease    Tuberculosis 1986 ish   treated   Unspecified disorder of kidney and ureter    URI 02/05/2009   Qualifier: Diagnosis of  By: Ronnald Ramp MD, [name].     Past Surgical History:  Procedure Laterality Date   CHOLECYSTECTOMY N/A 03/18/2020   Procedure: LAPAROSCOPIC CHOLECYSTECTOMY;  Surgeon: Coralie Keens, MD;  Location: [place];  Service: General;  Laterality: N/A;  60 MIN TOTAL - ROOM 10   KNEE SURGERY Right    2005, 2008. Scope for MCL tear   TONSILLECTOMY AND ADENOIDECTOMY     UMBILICAL HERNIA REPAIR N/A 03/18/2020   Procedure: HERNIA REPAIR UMBILICAL ADULT;  Surgeon: Coralie Keens, MD;  Location: [place];  Service: General;  Laterality: N/A;    Family History  Problem  Relation Age of Onset   Cancer Mother    Diabetes Mother    Cancer Father    Early death Father    Diabetes Brother    Cancer Brother        prostate   Drug abuse Brother    Birth defects Sister    Diabetes Maternal Grandmother    Thyroid disease Maternal Grandmother    Stroke Maternal Grandmother    Tremor Maternal Grandfather    Obesity Son     Social History   Tobacco Use   Smoking status: Former    Types: Landscape architect, Pipe    Quit date: 2001    Years since quitting: 23.0   Smokeless tobacco: Never  Vaping Use   Vaping Use: Never used  Substance Use Topics   Alcohol use: Yes    Comment: 1-2 times a year   Drug use: No    Prior to Admission medications   Medication Sig Start Date End Date Taking? Authorizing Provider  levofloxacin (LEVAQUIN) 750 MG tablet Take 1 tablet daily starting the morning of 01/15/2022. 01/14/22  Yes Lamonta Cypress, MD  doxycycline (VIBRA-TABS) 100 MG tablet Take 1 tablet (100 mg total) by mouth 2 (two) times daily. 01/13/22   Loralee Pacas, MD  furosemide (LASIX) 40 MG tablet Take 1 tablet (40 mg total) by mouth daily. 01/13/22   Loralee Pacas, MD  losartan (COZAAR) 100 MG tablet TAKE 1 TABLET(100 MG) BY MOUTH DAILY 08/20/21   Vivi Barrack, MD  Multiple Vitamins-Minerals (ONE-A-DAY MENS 50+) TABS Take 1 tablet by mouth daily.    [provider]  PFIZER COVID-19 VAC BIVALENT injection  07/05/21   [provider]  sildenafil (VIAGRA) 100 MG tablet Take 0.5-1 tablets (50-100 mg total) by mouth daily as needed for erectile dysfunction. 09/28/21   Vivi Barrack, MD    Allergies Androgel [testosterone] and Penicillins   REVIEW OF SYSTEMS  Negative except as noted here or in the History of Present Illness.   PHYSICAL EXAMINATION  Initial Vital Signs Blood pressure (!) 196/106, pulse 71, temperature 98 F (36.7 C), temperature source Oral, resp. rate (!) 23, weight 110 kg, SpO2 91 %.  Examination General: Well-developed,  well-nourished male in no acute distress; appearance consistent with age of record HENT: normocephalic; atraumatic Eyes: Normal appearance Neck: supple Heart: regular rate and rhythm Lungs: clear to auscultation bilaterally Abdomen: soft; nondistended; nontender; bowel sounds present Extremities: No deformity; full range of motion; 2+ edema of lower legs Neurologic: Awake, alert and oriented; motor function intact in all extremities  and symmetric; no facial droop Skin: Warm and dry Psychiatric: Normal mood and affect   RESULTS  Summary of this visit's results, reviewed and interpreted by myself:   EKG Interpretation  Date/Time:    Ventricular Rate:    PR Interval:    QRS Duration:   QT Interval:    QTC Calculation:   R Axis:     Text Interpretation:         Laboratory Studies: Results for orders placed or performed during the hospital encounter of 01/13/22 (from the past 24 hour(s))  Basic metabolic panel     Status: Abnormal   Collection Time: 01/13/22  6:49 PM  Result Value Ref Range   Sodium 143 135 - 145 mmol/L   Potassium 4.0 3.5 - 5.1 mmol/L   Chloride 104 98 - 111 mmol/L   CO2 31 22 - 32 mmol/L   Glucose, Bld 154 (H) 70 - 99 mg/dL   BUN 17 8 - 23 mg/dL   Creatinine, Ser 1.28 (H) 0.61 - 1.24 mg/dL   Calcium 9.9 8.9 - 10.3 mg/dL   GFR, Estimated 60 (L) >60 mL/min   Anion gap 8 5 - 15  CBC     Status: None   Collection Time: 01/13/22  6:49 PM  Result Value Ref Range   WBC 9.4 4.0 - 10.5 K/uL   RBC 5.77 4.22 - 5.81 MIL/uL   Hemoglobin 16.4 13.0 - 17.0 g/dL   HCT 51.2 39.0 - 52.0 %   MCV 88.7 80.0 - 100.0 fL   MCH 28.4 26.0 - 34.0 pg   MCHC 32.0 30.0 - 36.0 g/dL   RDW 14.8 11.5 - 15.5 %   Platelets 333 150 - 400 K/uL   nRBC 0.0 0.0 - 0.2 %  Brain natriuretic peptide     Status: None   Collection Time: 01/13/22  6:49 PM  Result Value Ref Range   B Natriuretic Peptide 13.9 0.0 - 100.0 pg/mL  Hepatic function panel     Status: None   Collection Time:  01/13/22  6:49 PM  Result Value Ref Range   Total Protein 7.7 6.5 - 8.1 g/dL   Albumin 4.4 3.5 - 5.0 g/dL   AST 26 15 - 41 U/L   ALT 32 0 - 44 U/L   Alkaline Phosphatase 85 38 - 126 U/L   Total Bilirubin 0.5 0.3 - 1.2 mg/dL   Bilirubin, Direct 0.2 0.0 - 0.2 mg/dL   Indirect Bilirubin 0.3 0.3 - 0.9 mg/dL   Imaging Studies: CT Angio Chest PE W and/or Wo Contrast  Result Date: 01/14/2022 CLINICAL DATA:  Cough and hypoxia. EXAM: CT ANGIOGRAPHY CHEST WITH CONTRAST TECHNIQUE: Multidetector CT imaging of the chest was performed using the standard protocol during bolus administration of intravenous contrast. Multiplanar CT image reconstructions and MIPs were obtained to evaluate the vascular anatomy. RADIATION DOSE REDUCTION: This exam was performed according to the departmental dose-optimization program which includes automated exposure control, adjustment of the mA and/or kV according to patient size and/or use of iterative reconstruction technique. CONTRAST:  8m OMNIPAQUE IOHEXOL 350 MG/ML SOLN COMPARISON:  Chest radiograph dated 01/13/2022 and cardiac CT dated 06/13/2018. FINDINGS: Cardiovascular: There is no cardiomegaly or pericardial effusion. Mild atherosclerotic calcification of the aortic arch. No aneurysmal dilatation or dissection. No pulmonary artery embolus identified. Mediastinum/Nodes: Mild bilateral hilar adenopathy measures 13 mm in short axis on the left. The esophagus and the thyroid gland are grossly unremarkable. No mediastinal fluid collection. Lungs/Pleura: There is patchy area  of consolidation and nodularity predominantly involving the left lower lobe as well as additional area of streaky and nodular density in the right middle lobe, right lung base and lingula most consistent with multilobar pneumonia. Clinical correlation and follow-up to resolution recommended. Elevation of the diaphragms bilaterally may be related to poor inspiratory effort. There is no pleural effusion or  pneumothorax. There is diffuse thickening of the bronchial walls. The central airways remain patent. Upper Abdomen: Cholecystectomy. Musculoskeletal: No acute osseous pathology. Review of the MIP images confirms the above findings. IMPRESSION: 1. No CT evidence of pulmonary artery embolus. 2. Findings most consistent with multilobar pneumonia. Clinical correlation and follow-up to resolution recommended. 3. Mild bilateral hilar adenopathy, likely reactive. 4.  Aortic Atherosclerosis (ICD10-I70.0). Electronically Signed   By: Anner Crete M.D.   On: 01/14/2022 00:39   DG Chest 2 View  Result Date: 01/13/2022 CLINICAL DATA:  Shortness of breath EXAM: CHEST - 2 VIEW COMPARISON:  07/28/2021 FINDINGS: Stable cardiomediastinal silhouette. Left basilar atelectasis. No pleural effusion or pneumothorax. IMPRESSION: Left basilar atelectasis.  No active cardiopulmonary disease. Electronically Signed   By: Placido Sou M.D.   On: 01/13/2022 19:12    ED COURSE and MDM  Nursing notes, initial and subsequent vitals signs, including pulse oximetry, reviewed and interpreted by myself.  Vitals:   01/13/22 2300 01/14/22 0000 01/14/22 0009 01/14/22 0117  BP: (!) 196/106 (!) 196/101  (!) 131/113  Pulse: 71 82 82 82  Resp: (!) 23 19 (!) 22 18  Temp:      TempSrc:      SpO2: 91% 94% 95% 93%  Weight:       Medications  levofloxacin (LEVAQUIN) IVPB 750 mg (750 mg Intravenous New Bag/Given 01/14/22 0117)  iohexol (OMNIPAQUE) 350 MG/ML injection 100 mL (85 mLs Intravenous Contrast Given 01/14/22 0018)   12:54 AM Patient CT scan is suggestive of multilobar pneumonia.  The patient has already been prescribed doxycycline but has not yet started it.  I suspect Levaquin would be a better choice for him than doxycycline given his comorbidities.  We will start him on levofloxacin in the ED. a note has been sent to Dr. Randol Kern advising him of CT findings.   PROCEDURES  Procedures   ED DIAGNOSES     ICD-10-CM    1. Multifocal pneumonia  J18.9          Veneta Sliter, MD 01/14/22 0126

## 2022-01-13 NOTE — Progress Notes (Signed)
[facility]: [phone]   Routine Medical Office Visit  Patient:  Dale Wood      Age: 71 y.o.       Sex:  male  Date:   01/13/2022  PCP:    Vivi Barrack, [place] Provider: Loralee Pacas, MD  Assessment/Plan:   Keshawn was seen today for follow-up, cough and covid test negative.  Anasarca -     For home use only DME oxygen -     Furosemide; Take 1 tablet (40 mg total) by mouth daily.  Dispense: 30 tablet; Refill: 3  Acute hypoxic respiratory failure ([place]) -     For home use only DME oxygen -     Furosemide; Take 1 tablet (40 mg total) by mouth daily.  Dispense: 30 tablet; Refill: 3 -     Doxycycline Hyclate; Take 1 tablet (100 mg total) by mouth 2 (two) times daily.  Dispense: 14 tablet; Refill: 0  Pneumonia due to infectious organism, unspecified laterality, unspecified part of lung -     Doxycycline Hyclate; Take 1 tablet (100 mg total) by mouth 2 (two) times daily.  Dispense: 14 tablet; Refill: 0      My main concern is that he has not been ruled out for a blood clot as the cause of his severe orthopnea with oxygen desaturation with brief walking and he is not improving at all.  I feel pulmonary embolism  needs to be ruled out urgently so I am sending him back to the emergency room for D-dimer and CT angio or V/Q.  I will go ahead and send in Lasix for the fluid overload which I do not know the cause of but I think he has anasarca and possibly acute diastolic heart failure despite normal BNP- recommmend nt-proBNP on the repeat and get full CMP to check liver and kidneys..  Also going give him an antibiotic in case of pneumonia but it would be unusual walking pneumonia although I guess it is possible so I will go ahead and send in doxycycline.  Also I will go ahead and start arranging home oxygen.   Severe Orthopnea and Hypoxia The patient is a 71 year old individual with obesity who presents with severe orthopnea, hypoxia as  evidenced by an oxygen saturation of 86% on a 1-minute walk, and a frequent cough persisting for 3-4 weeks. His need to sleep upright in a recliner suggests significant discomfort while lying flat, which is often associated with heart failure or severe respiratory disorders. The presence of fluid in the legs could indicate peripheral edema, commonly seen in heart failure, but the normal B-type natriuretic peptide (BNP) level makes this less likely. Atelectasis on chest x-ray (CXR) could be due to or contribute to the hypoxia. The stable creatinine level indicates that renal function is not acutely compromised. The absence of liver studies leaves a gap in the assessment of potential hepatic contribution to fluid accumulation. The minimal smoking history is less suggestive of a primary lung pathology such as COPD or lung cancer. The symptom of blowing the nose a lot could indicate an upper respiratory tract issue, but its relevance to the current presentation is unclear. The differential diagnosis includes congestive heart failure with preserved ejection fraction, pulmonary embolism, pneumonia, chronic obstructive pulmonary disease (COPD), and atypical presentation of acute coronary syndrome. Dx: Echocardiogram to assess cardiac structure and function. D-dimer to evaluate for pulmonary embolism if clinically indicated. Complete blood count (CBC) to check for signs  of infection or anemia. Chest computed tomography (CT) scan to further evaluate atelectasis and rule out pulmonary embolism. Arterial blood gas (ABG) to assess the severity of hypoxia and acid-base status. Liver function tests (LFTs) to assess for hepatic causes of fluid retention. N-terminal pro b-type natriuretic peptide (NT-proBNP) as it is more sensitive and specific than BNP in some cases. Spirometry to assess for COPD if the patient's history suggests this as a possibility. Tx: Supplemental oxygen to maintain oxygen saturation above  90%. Diuretics if heart failure is confirmed or clinically suspected despite normal BNP. Bronchodilators if COPD is diagnosed. Anticoagulation therapy if pulmonary embolism is diagnosed. Antibiotics if bacterial pneumonia is suspected. Optimization of fluid status and consideration of salt restriction if fluid overload is present. Cardiology or pulmonology consultation for further management based on the results of the diagnostic studies.   Subjective:   Dale Wood is a 71 y.o. male with the following chart data reviewed: Past Medical History:  Diagnosis Date   Abdominal pain, epigastric 10/27/2008   Qualifier: Diagnosis of  By: Ronnald Ramp MD, [place], RIGHT 09/22/2009   Qualifier: Diagnosis of  By: Nathaneil Canary, CMA, [name]     Basal cell carcinoma 10/29/2012   left forearm tx [id number]   BASAL CELL CARCINOMA, FACE 10/27/2008   Qualifier: Diagnosis of  By: [name], [name]    BASAL CELL CARCINOMA, FACE 10/27/2008   Qualifier: Diagnosis of By: [name], [name]    Cancer ([facility]    Skin Cancer on Nose   CELLULITIS, FOOT 09/22/2009   Qualifier: Diagnosis of  By: [name], [name]    Constipation 156/43/3295  DYSMETABOLIC SYNDROME 11/88/4166  Qualifier: Diagnosis of  By: [name], [place]/11/2008   Qualifier: Diagnosis of  By: [name], [name]    ERECTILE DYSFUNCTION, ORGANIC 10/27/2008   Qualifier: Diagnosis of  By: [name], [name]    Headache 07/30/2015   Heart murmur    History of kidney stones    about 30 passed   Hyperlipidemia    HYPERLIPIDEMIA 10/27/2008   Qualifier: Diagnosis of  By: [name], [name]    Hypertension    HYPOGONADISM 11/26/2008   Qualifier: Diagnosis of  By: [name], [name]    Hypothyroidism    Kidney stones    Low testosterone    NEPHROLITHIASIS, HX OF 10/27/2008   Qualifier: Diagnosis of  By: [name], [name]    Nephrolithiasis, uric acid    OSTEOARTHRITIS 10/27/2008   Qualifier: Diagnosis of  By:  [name], [name]    Positive TB test    Postherpetic neuralgia 03/27/2017   Rheumatic fever    Rheumatic fever    SKIN CANCER, HX OF 10/27/2008   Qualifier: Diagnosis of  By: [name], [name]    Squamous cell carcinoma of skin 11/27/2013   left shin tx with bx curet and 5 fu   Tachycardia 11/09/2017   Thyroid disease    Tuberculosis 1986 ish   treated   Unspecified disorder of kidney and ureter    URI 02/05/2009   Qualifier: Diagnosis of  By: [name], [name]    Outpatient Medications Prior to Visit  Medication Sig   losartan (COZAAR) 100 MG tablet TAKE 1 TABLET(100 MG) BY MOUTH DAILY   Multiple Vitamins-Minerals (ONE-A-DAY MENS 50+) TABS Take 1 tablet by mouth daily.   PFIZER COVID-19 VAC BIVALENT injection  sildenafil (VIAGRA) 100 MG tablet Take 0.5-1 tablets (50-100 mg total) by mouth daily as needed for erectile dysfunction.   atorvastatin (LIPITOR) 40 MG tablet Take 1 tablet (40 mg total) by mouth daily. (Patient not taking: Reported on 12/06/2021)   levothyroxine (SYNTHROID) 50 MCG tablet TAKE 1 TABLET(50 MCG) BY MOUTH DAILY BEFORE AND BREAKFAST (Patient not taking: Reported on 12/06/2021)   [DISCONTINUED] furosemide (LASIX) 20 MG tablet Take 1 tablet (20 mg total) by mouth daily as needed. (Patient not taking: Reported on 12/06/2021)   No facility-administered medications prior to visit.     He presented today reporting reason for visit as: Chief Complaint  Patient presents with   Follow-up    ED visit on 12/19 for viral URI with cough-not any better, worse when laying down. Not getting much sleep (about three hours). Has slept in a recliner for about twelve days.    Cough    Productive-clear mucus (mostly) for about three weeks. Was told at ED he's not getting enough oxygen to lungs. Followed by [facility]B for about a hour. Caused gagging that caused vomiting for two nights.   COVID test negative    Done at hospital-12/19.    Reviewed and confirmed emergency room  visit hpi with patient from 01/04/22  Emergency room hpi: Patient is a 71 year old male with a past medical history of hypertension presenting to the emergency department with a cough.  The patient states that he has had a mildly productive cough over the last 2 weeks that has been worsening.  He states that he has had swelling in his legs for the last month.  He denies any significant chest pain.  He states that he does feel short of breath on exertion and with coughing spells.  He denies any fevers or chills, nausea, vomiting or diarrhea.  He states he was seen in urgent care today and was treated with a neb, steroids and ceftriaxone and had an x-ray that showed concern for "a partially collapsed lung" and was recommended to come to the emergency department.   Emergency room noted pitting edema and chest xray showing atelectasis patient states that the edema is chronic. BNP was normal. Flu/COVID/RSV screens all negative   Interim history: cough is unchanged, more mucus in it though. Still having orthopnea sleeping in recliner- otherwise coughing and shortness of breath severe spell unless he sleeps in recliner.   Doesn't smoke, but used to smoke cigars in 80s'90s never smoked cigarettes.  He has remote history rheumatic fever and ltbi  Has history of subclinical hypothyroidism TSH last checked 3 month(s) ago was 3.37  Last echocardiogram results from 2020 reviewed:    IMPRESSIONS     1. The left ventricle has normal systolic function with an ejection  fraction of 60-65%. The cavity size was normal. There is mildly increased  left ventricular wall thickness. Left ventricular diastolic parameters  were normal.   2. The right ventricle has normal systolic function. The cavity was  normal. There is no increase in right ventricular wall thickness.   3. Mild thickening of the mitral valve leaflet. Mild calcification of the  mitral valve leaflet.   4. The aortic valve is tricuspid. Moderate  thickening of the aortic  valve. Sclerosis without any evidence of stenosis of the aortic valve.   FINDINGS   Left Ventricle: The left ventricle has normal systolic function, with an  ejection fraction of 60-65%. The cavity size was normal. There is mildly  increased left ventricular wall thickness.  Left ventricular diastolic  parameters were normal.   Right Ventricle: The right ventricle has normal systolic function. The  cavity was normal. There is no increase in right ventricular wall  thickness.   Left Atrium: Left atrial size was normal in size.   Right Atrium: Right atrial size was normal in size. Right atrial pressure  is estimated at 10 mmHg.   Interatrial Septum: No atrial level shunt detected by color flow Doppler.   Pericardium: There is no evidence of pericardial effusion.   Mitral Valve: The mitral valve is normal in structure. Mild thickening of  the mitral valve leaflet. Mild calcification of the mitral valve leaflet.  Mitral valve regurgitation is trivial by color flow Doppler.   Tricuspid Valve: The tricuspid valve is normal in structure. Tricuspid  valve regurgitation is mild by color flow Doppler.   Aortic Valve: The aortic valve is tricuspid Moderate thickening of the  aortic valve. Sclerosis without any evidence of stenosis of the aortic  valve. Aortic valve regurgitation was not visualized by color flow  Doppler. There is no evidence of aortic valve   stenosis.   Pulmonic Valve: The pulmonic valve was grossly normal. Pulmonic valve  regurgitation is mild by color flow Doppler.   Venous: The inferior vena cava is normal in size with greater than 50%  respiratory variability.        Objective:  Physical Exam  BP 130/71 (BP Location: Left Arm, Patient Position: Sitting)   Pulse 85   Temp 98.7 F (37.1 C) (Temporal)   Wt 243 lb 6.4 oz (110.4 kg)   SpO2 92% Comment: 86 % on 30 yard walk  BMI 33.95 kg/m  Vital signs reviewed.  Nursing notes  reviewed. General Appearance/Constitutional:  Obese male in no acute distress Musculoskeletal: All extremities are intact.  Neurological:  Awake, alert,  No obvious focal neurological deficits or cognitive impairments Psychiatric:  Appropriate mood, pleasant demeanor Problem-specific findings:  no jvd appreciated, bilateral leg edema pitting, frequent wheezy non productive cough.    Lungs clear to auscultation bilaterally despite wheezy cough, increased work of breathing especially  after long cough paroxysms,  nasal sniffling note.  1 min walk test dropped sa02 to 86% and he was heaving. Nasal mucosa clear    Results Reviewed:  No results found for any visits on 01/13/22.   Recent Results (from the past 2160 hour(s))  Cologuard     Status: None   Collection Time: 12/21/21  5:00 PM  Result Value Ref Range   COLOGUARD Negative Negative    Comment:  NEGATIVE TEST RESULT. A negative Cologuard result indicates a low likelihood that a colorectal cancer (CRC) or advanced adenoma (adenomatous polyps with more advanced pre-malignant features)  is present. The chance that a person with a negative Cologuard test has a colorectal cancer is less than 1 in 1500 (negative predictive value >99.9%) or has an  advanced adenoma is less than  5.3% (negative predictive value 94.7%). These data are based on a prospective cross-sectional study of 10,000 individuals at average risk for colorectal cancer who were screened with both Cologuard and colonoscopy. (Imperiale T. et al, N Engl J Med 2014;370(14):1286-1297) The normal value (reference range) for this assay is negative.  COLOGUARD RE-SCREENING RECOMMENDATION: Periodic colorectal cancer screening is an important part of preventive healthcare for asymptomatic individuals at average risk for colorectal cancer.  Following a negative Cologuard result, the [place] Task Force screening guidelines recommend a Cologuard  re-screening  interval of 3 years.  References: American Cancer Society Guideline for Colorectal Cancer Screening: https://www.cancer.org/cancer/colon-rectal-cancer/detection-diagnosis-staging/acs-recommendations.html.; Rex DK, Boland CR, Dominitz JK, Colorectal Cancer Screening: Recommendations for Physicians and Patients from the [place] Task Force on Colorectal Cancer Screening , Am J Gastroenterology 2017; 854:6270-3500.  TEST DESCRIPTION: Composite algorithmic analysis of stool DNA-biomarkers with hemoglobin immunoassay.   Quantitative values of individual biomarkers are not reportable and are not associated with individual biomarker result reference ranges. Cologuard is intended for colorectal cancer screening of adults of either sex, 52 years or older, who are at average-risk for colorectal cancer (CRC). Cologuard has been approved for use by the U.S. FDA. The performance of Cologuard was  established in a cross sectional study of average-risk adults aged 59-84. Cologuard performance in patients ages 39 to 62 years was estimated by sub-group analysis of near-age groups. Colonoscopies performed for a positive result may find as the most clinically significant lesion: colorectal cancer [4.0%], advanced adenoma (including sessile serrated polyps greater than or equal to 1cm diameter) [20%] or non- advanced adenoma [31%]; or no colorectal neoplasia [45%]. These estimates are derived from a prospective cross-sectional screening study of 10,000 individuals at average risk for colorectal cancer who were screened with both Cologuard and colonoscopy. (Imperiale T. et al, Alison Stalling J Med 2014;370(14):1286-1297.) Cologuard may produce a false negative or false positive result (no colorectal cancer or precancerous polyp present at colonoscopy follow up). A negative Cologuard test result does not guarantee the absence of CRC or advanced adenoma (pre-cancer). The current Cologuard  screening interval is every 3  years. Paramedic and [employer] developer). Cologuard performance data in a 10,000 patient pivotal study using colonoscopy as the reference method can be accessed at the following location: www.exactlabs.com/results. Additional description of the Cologuard test process, warnings and precautions can be found at www.cologuard.com.   Resp panel by RT-PCR (RSV, Flu A&B, Covid) Anterior Nasal Swab     Status: None   Collection Time: 01/04/22  6:13 PM   Specimen: Anterior Nasal Swab  Result Value Ref Range   SARS Coronavirus 2 by RT PCR NEGATIVE NEGATIVE    Comment: (NOTE) SARS-CoV-2 target nucleic acids are NOT DETECTED.  The SARS-CoV-2 RNA is generally detectable in upper respiratory specimens during the acute phase of infection. The lowest concentration of SARS-CoV-2 viral copies this assay can detect is 138 copies/mL. A negative result does not preclude SARS-Cov-2 infection and should not be used as the sole basis for treatment or other patient management decisions. A negative result may occur with  improper specimen collection/handling, submission of specimen other than nasopharyngeal swab, presence of viral mutation(s) within the areas targeted by this assay, and inadequate number of viral copies(<138 copies/mL). A negative result must be combined with clinical observations, patient history, and epidemiological information. The expected result is Negative.  Fact Sheet for Patients:  EntrepreneurPulse.com.au  Fact Sheet for Healthcare Providers:  IncredibleEmployment.be  This test is no t yet approved or cleared by the [place] FDA and  has been authorized for detection and/or diagnosis of SARS-CoV-2 by FDA under an Emergency Use Authorization (EUA). This EUA will remain  in effect (meaning this test can be used) for the duration of the COVID-19 declaration under Section 564(b)(1) of the Act, 21 U.S.C.section  360bbb-3(b)(1), unless the authorization is terminated  or revoked sooner.       Influenza A by PCR NEGATIVE NEGATIVE   Influenza B by PCR NEGATIVE NEGATIVE    Comment: (NOTE) The Xpert  Xpress SARS-CoV-2/FLU/RSV plus assay is intended as an aid in the diagnosis of influenza from Nasopharyngeal swab specimens and should not be used as a sole basis for treatment. Nasal washings and aspirates are unacceptable for Xpert Xpress SARS-CoV-2/FLU/RSV testing.  Fact Sheet for Patients: EntrepreneurPulse.com.au  Fact Sheet for Healthcare Providers: IncredibleEmployment.be  This test is not yet approved or cleared by the [place] FDA and has been authorized for detection and/or diagnosis of SARS-CoV-2 by FDA under an Emergency Use Authorization (EUA). This EUA will remain in effect (meaning this test can be used) for the duration of the COVID-19 declaration under Section 564(b)(1) of the Act, 21 U.S.C. section 360bbb-3(b)(1), unless the authorization is terminated or revoked.     Resp Syncytial Virus by PCR NEGATIVE NEGATIVE    Comment: (NOTE) Fact Sheet for Patients: EntrepreneurPulse.com.au  Fact Sheet for Healthcare Providers: IncredibleEmployment.be  This test is not yet approved or cleared by the [place] FDA and has been authorized for detection and/or diagnosis of SARS-CoV-2 by FDA under an Emergency Use Authorization (EUA). This EUA will remain in effect (meaning this test can be used) for the duration of the COVID-19 declaration under Section 564(b)(1) of the Act, 21 U.S.C. section 360bbb-3(b)(1), unless the authorization is terminated or revoked.  Performed at [facility], [address] [place], [place] [address]   Brain natriuretic peptide     Status: None   Collection Time: 01/04/22 11:10 PM  Result Value Ref Range   B Natriuretic Peptide 23.1 0.0 - 100.0 pg/mL     Comment: Performed at [facility], [address], [place], [place] [address]  Basic metabolic panel     Status: Abnormal   Collection Time: 01/04/22 11:10 PM  Result Value Ref Range   Sodium 138 135 - 145 mmol/L   Potassium 4.0 3.5 - 5.1 mmol/L   Chloride 102 98 - 111 mmol/L   CO2 26 22 - 32 mmol/L   Glucose, Bld 232 (H) 70 - 99 mg/dL    Comment: Glucose reference range applies only to samples taken after fasting for at least 8 hours.   BUN 17 8 - 23 mg/dL   Creatinine, Ser 1.23 0.61 - 1.24 mg/dL   Calcium 9.3 8.9 - 10.3 mg/dL   GFR, Estimated >60 >60 mL/min    Comment: (NOTE) Calculated using the CKD-EPI Creatinine Equation (2021)    Anion gap 10 5 - 15    Comment: Performed at [facility], [place], [place] [address]  Troponin I (High Sensitivity)     Status: None   Collection Time: 01/04/22 11:10 PM  Result Value Ref Range   Troponin I (High Sensitivity) 6 <18 ng/L    Comment: (NOTE) Elevated high sensitivity troponin I (hsTnI) values and significant  changes across serial measurements may suggest ACS but many other  chronic and acute conditions are known to elevate hsTnI results.  Refer to the "Links" section for chest pain algorithms and additional  guidance. Performed at [facility], [address], [place], [place] [address]   CBC with Differential     Status: None   Collection Time: 01/04/22 11:10 PM  Result Value Ref Range   WBC 6.8 4.0 - 10.5 K/uL   RBC 5.49 4.22 - 5.81 MIL/uL   Hemoglobin 15.7 13.0 - 17.0 g/dL   HCT 48.0 39.0 - 52.0 %   MCV 87.4 80.0 - 100.0 fL   MCH 28.6 26.0 - 34.0 pg   MCHC  32.7 30.0 - 36.0 g/dL   RDW 14.6 11.5 - 15.5 %   Platelets 294 150 - 400 K/uL   nRBC 0.0 0.0 - 0.2 %   Neutrophils Relative % 87 %   Neutro Abs 5.9 1.7 - 7.7 K/uL   Lymphocytes Relative 11 %   Lymphs Abs 0.7 0.7 - 4.0 K/uL   Monocytes Relative 1 %   Monocytes Absolute 0.1 0.1 - 1.0 K/uL    Eosinophils Relative 0 %   Eosinophils Absolute 0.0 0.0 - 0.5 K/uL   Basophils Relative 1 %   Basophils Absolute 0.1 0.0 - 0.1 K/uL   Immature Granulocytes 0 %   Abs Immature Granulocytes 0.02 0.00 - 0.07 K/uL    Comment: Performed at [facility], [address] [place], [place] [address]      Loralee Pacas, MD

## 2022-01-14 ENCOUNTER — Telehealth: Payer: Self-pay | Admitting: Family Medicine

## 2022-01-14 ENCOUNTER — Emergency Department (HOSPITAL_BASED_OUTPATIENT_CLINIC_OR_DEPARTMENT_OTHER): Payer: PPO

## 2022-01-14 DIAGNOSIS — R059 Cough, unspecified: Secondary | ICD-10-CM | POA: Diagnosis not present

## 2022-01-14 LAB — HEPATIC FUNCTION PANEL
ALT: 32 U/L (ref 0–44)
AST: 26 U/L (ref 15–41)
Albumin: 4.4 g/dL (ref 3.5–5.0)
Alkaline Phosphatase: 85 U/L (ref 38–126)
Bilirubin, Direct: 0.2 mg/dL (ref 0.0–0.2)
Indirect Bilirubin: 0.3 mg/dL (ref 0.3–0.9)
Total Bilirubin: 0.5 mg/dL (ref 0.3–1.2)
Total Protein: 7.7 g/dL (ref 6.5–8.1)

## 2022-01-14 MED ORDER — LEVOFLOXACIN 750 MG PO TABS: ORAL_TABLET | ORAL | 0 refills | Status: DC

## 2022-01-14 MED ORDER — LEVOFLOXACIN IN D5W 750 MG/150ML IV SOLN
750.0000 mg | Freq: Once | INTRAVENOUS | Status: AC
  Administered 2022-01-14: 750 mg via INTRAVENOUS
  Filled 2022-01-14: qty 150

## 2022-01-14 MED ORDER — IOHEXOL 350 MG/ML SOLN
100.0000 mL | Freq: Once | INTRAVENOUS | Status: AC | PRN
  Administered 2022-01-14: 85 mL via INTRAVENOUS

## 2022-01-14 NOTE — Telephone Encounter (Signed)
Pt would like for Dr [name] to look at his chart from the ED and let him know if pt needs to be seen in office or will get a call back. Please advise.

## 2022-01-14 NOTE — Telephone Encounter (Signed)
Pt was seen in [facility] on 01/13/22.  Patient Name: Dale Wood CUE Gender: Male DOB: 02-15-1950 Age: 71 Y 34 M 43 D Return Phone Number: [phone] (Primary), [phone] (Secondary) Address: City/ State/ Zip: [place]  [address] Client [facility] Night Provider Berniece Pap- MD Contact Type Call Who Is Calling Patient / Member / Family / Caregiver Call Type Triage / Clinical Relationship To Patient Self Return Phone Number [phone] (Primary) Chief Complaint BREATHING - shortness of breath or sounds breathless Reason for Call Request to Speak to a Physician Initial Comment Caller states he was in the office today at 4pm.States he was supposed to call a order in for a cat scan at the ED and he is there and there is not there.States he has a deep cough and a lot of fluid build up. States he might have blockage in the lungs and does have trouble breathing. Translation No No Triage Reason Patient declined Nurse Assessment Nurse: Marcelline Deist, RN, Mickel Baas Date/Time Eilene Ghazi Time): 01/13/2022 6:31:05 PM Confirm and document reason for call. If symptomatic, describe symptoms. ---Pt states he was seen today at the office and was told by his PCP that he will need to go to the ED and have a CT scan done because he was not sure if it is his heart or lungs. States he is at the ED and that there is no order for a CT scan, wants to know what to do. Does the patient have any new or worsening symptoms? ---Yes Will a triage be completed? ---No Select reason for no triage. ---Patient declined Disp. Time Eilene Ghazi Time) Disposition Final User 01/13/2022 6:21:35 PM Send to Urgent Queue Vena Rua 01/13/2022 6:28:03 PM Called On-Call Provider Marcelline Deist, RN, Mickel Baas 01/13/2022 6:33:20 PM Clinical Call Yes Marcelline Deist, RN, Mickel Baas Final Disposition 01/13/2022 6:33:20 PM Clinical Call Yes Marcelline Deist,  RN, Mickel Baas Comments User: Talmadge Chad, RN Date/Time Eilene Ghazi Time): 01/13/2022 6:33:04 PM Caller aware of what the on call stated and he V/U. See prev notes. Referrals [facility] - ED Paging DoctorName Phone DateTime Result/ Outcome Message Type Notes Cathlean Cower- MD [phone] 01/13/2022 6:28:03 PM Called On Call Provider - Reached Doctor Paged Cathlean Cower- MD 01/13/2022 6:30:56 PM Spoke with On Call - General Message Result Spoke to the on call and amde aware that the pt was seen today by his PCP and advised to go to the ED at [facility] and have a CT scan done and that he was not sure if it was the pt's heart or his lungs. Alos that the pt is there and that there is not an order for the CT scan and what should he do. On call states ewhat they typically do is send the pt there an dthe pt is to check in to the ED and make them aware that he saw his PCP and that his PCP stated that he will needing a CT scan and that they will take care of him there.

## 2022-01-18 NOTE — Telephone Encounter (Signed)
Patient need an ED follow up visit

## 2022-01-21 ENCOUNTER — Encounter: Payer: Self-pay | Admitting: Family Medicine

## 2022-01-21 ENCOUNTER — Ambulatory Visit (INDEPENDENT_AMBULATORY_CARE_PROVIDER_SITE_OTHER): Payer: PPO | Admitting: Family Medicine

## 2022-01-21 VITALS — BP 133/76 | HR 65 | Temp 98.2°F | Ht 71.0 in | Wt 235.4 lb

## 2022-01-21 DIAGNOSIS — M791 Myalgia, unspecified site: Secondary | ICD-10-CM

## 2022-01-21 DIAGNOSIS — T466X5A Adverse effect of antihyperlipidemic and antiarteriosclerotic drugs, initial encounter: Secondary | ICD-10-CM

## 2022-01-21 DIAGNOSIS — I10 Essential (primary) hypertension: Secondary | ICD-10-CM | POA: Diagnosis not present

## 2022-01-21 MED ORDER — ONDANSETRON 8 MG PO TBDP: 8.0000 mg | ORAL_TABLET | Freq: Three times a day (TID) | ORAL | 0 refills | Status: DC | PRN

## 2022-01-21 NOTE — Patient Instructions (Signed)
It was very nice to see you today!  Please Try using incentive spirometer to help keep your lungs open and prevent pneumonia.  We will try using Zofran to help with your nausea.  Please let us know if not improving by next week.  Take care, Dr [name]  PLEASE NOTE:  If you had any lab tests, please let us know if you have not heard back within a few days. You may see your results on mychart before we have a chance to review them but we will give you a call once they are reviewed by [place].   If we ordered any referrals today, please let us know if you have not heard from their office within the next week.   If you had any urgent prescriptions sent in today, please check with the pharmacy within an hour of our visit to make sure the prescription was transmitted appropriately.   Please try these tips to maintain a healthy lifestyle:  Eat at least 3 REAL meals and 1-2 snacks per day.  Aim for no more than 5 hours between eating.  If you eat breakfast, please do so within one hour of getting up.   Each meal should contain half fruits/vegetables, one quarter protein, and one quarter carbs (no bigger than a computer mouse)  Cut down on sweet beverages. This includes juice, soda, and sweet tea.   Drink at least 1 glass of water with each meal and aim for at least 8 glasses per day  Exercise at least 150 minutes every week.

## 2022-01-21 NOTE — Assessment & Plan Note (Signed)
Blood pressure elevated while in the ED but at goal now.  Continue losartan 100 mg daily.

## 2022-01-21 NOTE — Assessment & Plan Note (Signed)
Had significant myalgias with atorvastatin.  Had issues with several statins in the past and would like to avoid these medications going forward.

## 2022-01-21 NOTE — Progress Notes (Signed)
Dale Wood is a 72 y.o. male who presents today for an office visit.  Assessment/Plan:  New/Acute Problems: CAP Improving after course of antibiotics.  Still has some crackles on exam today but clinically seems to be improving.  We did discuss incentive spirometer and deep breathing exercises.  He will let us know if symptoms return  Nausea / Vomiting No red flags.  Reassuring abdominal exam today.  No peritoneal signs.  It is possible he could have some gastritis induced by his recent course of antibiotics.  We will try a short course of Zofran.  Encouraged hydration.  He will let us know if not improving.  Would consider CT abdominal scan if symptoms persist.  Given reassuring vitals today and reassuring exam do not think we need to get CT scan today.  We discussed reasons to return to care and seek emergent care.  Chronic Problems Addressed Today: Hypertension Blood pressure elevated while in the ED but at goal now.  Continue losartan 100 mg daily.  Myalgia due to statin Had significant myalgias with atorvastatin.  Had issues with several statins in the past and would like to avoid these medications going forward.     Subjective:  HPI:  Patient here for ED follow-up.  Went to the ED a week ago with worsening cough and congestion after being seen here by different provider.  They are concerned about CT angiogram.  CT scan in the ED showed multilobular pneumonia.  He was started on Levaquin.  Over the last week, cough has improved. No fevers or chills.  He has been compliant with antibiotics.  Finished his course of Levaquin.  Still has occasional chest pain but seems improved.  Main concern today is that over the last few days he has had persistent nausea and vomiting.  No melena or hematochezia.  No hematemesis.  No abdominal pain.  He has restricted his diet to bland foods including toast with peanut butter which was able to tolerate.  He is not able to take more than a few  sips of water at a time.  ROS: Per HPI, otherwise a complete review of systems was negative.   PMH:  The following were reviewed and entered/updated in epic: Past Medical History:  Diagnosis Date   Abdominal pain, epigastric 10/27/2008   Qualifier: Diagnosis of  By: Ronnald Ramp MD, [place], RIGHT 09/22/2009   Qualifier: Diagnosis of  By: Nathaneil Canary, CMA, [name]     Basal cell carcinoma 10/29/2012   left forearm tx cx3 27f   BASAL CELL CARCINOMA, FACE 10/27/2008   Qualifier: Diagnosis of  By: [name], [name]    BASAL CELL CARCINOMA, FACE 10/27/2008   Qualifier: Diagnosis of By: [name], [name]    Cancer ([facility]    Skin Cancer on Nose   CELLULITIS, FOOT 09/22/2009   Qualifier: Diagnosis of  By: [name], [name]    Constipation 124/58/0998  DYSMETABOLIC SYNDROME 13/38/2505  Qualifier: Diagnosis of  By: [name], TT[place]on10/11/2008   Qualifier: Diagnosis of  By: [name], [name]    ERECTILE DYSFUNCTION, ORGANIC 10/27/2008   Qualifier: Diagnosis of  By: [name], [name]    Headache 07/30/2015   Heart murmur    History of kidney stones    about 30 passed   Hyperlipidemia    HYPERLIPIDEMIA 10/27/2008   Qualifier: Diagnosis of  By: [name], [name]  Hypertension    HYPOGONADISM 11/26/2008   Qualifier: Diagnosis of  By: Ronnald Ramp MD, [name].    Hypothyroidism    Kidney stones    Low testosterone    NEPHROLITHIASIS, HX OF 10/27/2008   Qualifier: Diagnosis of  By: Ronnald Ramp MD, [name].    Nephrolithiasis, uric acid    OSTEOARTHRITIS 10/27/2008   Qualifier: Diagnosis of  By: Ronnald Ramp MD, [name].    Positive TB test    Postherpetic neuralgia 03/27/2017   Rheumatic fever    Rheumatic fever    SKIN CANCER, HX OF 10/27/2008   Qualifier: Diagnosis of  By: Ronnald Ramp MD, [name].    Squamous cell carcinoma of skin 11/27/2013   left shin tx with bx curet and 5 fu   Tachycardia 11/09/2017   Thyroid disease    Tuberculosis 1986 ish   treated    Unspecified disorder of kidney and ureter    URI 02/05/2009   Qualifier: Diagnosis of  By: Ronnald Ramp MD, [name].    Patient Active Problem List   Diagnosis Date Noted   Myalgia due to statin 01/21/2022   Tinnitus 09/28/2021   Leg edema 08/13/2021   Subclinical hypothyroidism 09/09/2019   Hyperglycemia 08/29/2019   Low back pain with sciatica 07/09/2019   Hyperlipidemia 01/05/2017   Hypertension 01/05/2017   ANKLE INJURY, RIGHT 09/22/2009   ERECTILE DYSFUNCTION, ORGANIC 10/27/2008   OSTEOARTHRITIS 10/27/2008   ABDOMINAL PAIN, EPIGASTRIC 10/27/2008   SKIN CANCER, HX OF 10/27/2008   Nephrolithiasis 10/27/2008   BASAL CELL CARCINOMA, FACE 10/27/2008   Past Surgical History:  Procedure Laterality Date   CHOLECYSTECTOMY N/A 03/18/2020   Procedure: LAPAROSCOPIC CHOLECYSTECTOMY;  Surgeon: Coralie Keens, MD;  Location: [facility];  Service: General;  Laterality: N/A;  60 MIN TOTAL - ROOM 10   KNEE SURGERY Right    2005, 2008. Scope for MCL tear   TONSILLECTOMY AND ADENOIDECTOMY     UMBILICAL HERNIA REPAIR N/A 03/18/2020   Procedure: HERNIA REPAIR UMBILICAL ADULT;  Surgeon: Coralie Keens, MD;  Location: [facility];  Service: General;  Laterality: N/A;    Family History  Problem Relation Age of Onset   Cancer Mother    Diabetes Mother    Cancer Father    Early death Father    Diabetes Brother    Cancer Brother        prostate   Drug abuse Brother    Birth defects Sister    Diabetes Maternal Grandmother    Thyroid disease Maternal Grandmother    Stroke Maternal Grandmother    Tremor Maternal Grandfather    Obesity Son     Medications- reviewed and updated Current Outpatient Medications  Medication Sig Dispense Refill   furosemide (LASIX) 40 MG tablet Take 1 tablet (40 mg total) by mouth daily. 30 tablet 3   losartan (COZAAR) 100 MG tablet TAKE 1 TABLET(100 MG) BY MOUTH DAILY 90 tablet 1   Multiple Vitamins-Minerals (ONE-A-DAY MENS 50+) TABS Take 1 tablet by mouth daily.      ondansetron (ZOFRAN-ODT) 8 MG disintegrating tablet Take 1 tablet (8 mg total) by mouth every 8 (eight) hours as needed for nausea or vomiting. 20 tablet 0   sildenafil (VIAGRA) 100 MG tablet Take 0.5-1 tablets (50-100 mg total) by mouth daily as needed for erectile dysfunction. 30 tablet 5   No current facility-administered medications for this visit.    Allergies-reviewed and updated Allergies  Allergen Reactions   Androgel [Testosterone]     *Pains in Chest/Axillary area*  Penicillins     *Does Not Work*    Social History   Socioeconomic History   Marital status: Married    Spouse name: Not on file   Number of children: Not on file   Years of education: Not on file   Highest education level: Not on file  Occupational History   Not on file  Tobacco Use   Smoking status: Former    Types: Cigars, Pipe    Quit date: 2001    Years since quitting: 23.0   Smokeless tobacco: Never  Vaping Use   Vaping Use: Never used  Substance and Sexual Activity   Alcohol use: Yes    Comment: 1-2 times a year   Drug use: No   Sexual activity: Yes  Other Topics Concern   Not on file  Social History Narrative   Not on file   Social Determinants of Health   Financial Resource Strain: Low Risk  (12/06/2021)   Overall Financial Resource Strain (CARDIA)    Difficulty of Paying Living Expenses: Not hard at all  Food Insecurity: No Food Insecurity (12/06/2021)   Hunger Vital Sign    Worried About Running Out of Food in the Last Year: Never true    Sebastian in the Last Year: Never true  Transportation Needs: No Transportation Needs (12/06/2021)   PRAPARE - Hydrologist (Medical): No    Lack of Transportation (Non-Medical): No  Physical Activity: Sufficiently Active (12/06/2021)   Exercise Vital Sign    Days of Exercise per Week: 3 days    Minutes of Exercise per Session: 90 min  Stress: No Stress Concern Present (12/06/2021)   [place]    Feeling of Stress : Not at all  Social Connections: [place] (12/06/2021)   Social Connection and Isolation Panel [NHANES]    Frequency of Communication with Friends and Family: Once a week    Frequency of Social Gatherings with Friends and Family: More than three times a week    Attends Religious Services: More than 4 times per year    Active Member of [employer] or Organizations: Yes    Attends Archivist Meetings: 1 to 4 times per year    Marital Status: Married          Objective:  Physical Exam: BP 133/76   Pulse 65   Temp 98.2 F (36.8 C) (Temporal)   Ht '5\' 11"'$  (1.803 m)   Wt 235 lb 6.4 oz (106.8 kg)   SpO2 94%   BMI 32.83 kg/m   Gen: No acute distress, resting comfortably CV: Regular rate and rhythm with no murmurs appreciated Pulm: Normal work of breathing.  Speaking in full sentences.  Crackles in left lower base otherwise clear to auscultation throughout.   GI: Bowel sounds present.  No pain.  No rebound or guarding. Neuro: Grossly normal, moves all extremities Psych: Normal affect and thought content      Kayson Bullis M. [name], MD 01/21/2022 3:11 PM

## 2022-01-25 ENCOUNTER — Telehealth: Payer: Self-pay | Admitting: *Deleted

## 2022-01-25 NOTE — Telephone Encounter (Signed)
(  Key: [id number]) Ondansetron '8MG'$  dispersible tablets Sent: January 9th, 2024 Waiting for determination

## 2022-01-27 ENCOUNTER — Other Ambulatory Visit: Payer: Self-pay | Admitting: Family Medicine

## 2022-02-03 ENCOUNTER — Encounter: Payer: Self-pay | Admitting: Family Medicine

## 2022-02-03 ENCOUNTER — Ambulatory Visit (INDEPENDENT_AMBULATORY_CARE_PROVIDER_SITE_OTHER): Payer: PPO | Admitting: Family Medicine

## 2022-02-03 VITALS — BP 133/78 | HR 72 | Temp 98.0°F | Ht 71.0 in | Wt 242.2 lb

## 2022-02-03 DIAGNOSIS — E785 Hyperlipidemia, unspecified: Secondary | ICD-10-CM | POA: Diagnosis not present

## 2022-02-03 DIAGNOSIS — I10 Essential (primary) hypertension: Secondary | ICD-10-CM

## 2022-02-03 DIAGNOSIS — R739 Hyperglycemia, unspecified: Secondary | ICD-10-CM

## 2022-02-03 NOTE — Progress Notes (Signed)
   Dale Wood is a 72 y.o. male who presents today for an office visit.  Assessment/Plan:  New/Acute Problems: CAP Resolved.  Discussed breathing exercises.  We discussed reasons to return to care.  Follow-up as needed.  Nausea/vomiting Also resolved.  May have had mild gastritis related to his recent infection.  No longer needs the Zofran.  We discussed reasons to return to care.  Follow-up as needed.  Chronic Problems Addressed Today: Hypertension Blood pressure at goal today on losartan 100 mg daily.  Hyperlipidemia Check lipids when he comes back in for CPE later this year.  Hyperglycemia Check A1c when he comes back in for CPE later this year.      Subjective:  HPI:  See A/p for status of chronic conditions.   He is here today for follow-up.  We last saw him about 2 weeks ago for pneumonia follow-up.  Was doing reasonably well at that point though was still having some ongoing nausea and vomiting.  We sent a prescription in for Zofran however insurance would not pay for this.  His symptoms went away shortly after our visit.  He feels like he is breathing well.  Has not had any other issues with nausea or vomiting.  Did have some issues with constipation that is since resolved.  Still feels a bit fatigued but overall feels better than he has over the last few weeks.        Objective:  Physical Exam: BP 133/78   Pulse 72   Temp 98 F (36.7 C) (Temporal)   Ht '5\' 11"'$  (1.803 m)   Wt 242 lb 3.2 oz (109.9 kg)   SpO2 95%   BMI 33.78 kg/m   Gen: No acute distress, resting comfortably CV: Regular rate and rhythm with no murmurs appreciated Pulm: Normal work of breathing, clear to auscultation GI: S, NT, ND, Bowel sounds present Neuro: Grossly normal, moves all extremities Psych: Normal affect and thought content      Dale Arizmendi M. [name], MD 02/03/2022 2:49 PM

## 2022-02-03 NOTE — Patient Instructions (Signed)
It was very nice to see you today!  I am glad that you are doing better.  Please continue to work on your breathing exercises.  We will see you back in September for your next annual checkup.  Please come back to see [place] sooner if needed.  Take care, Dr [name]  PLEASE NOTE:  If you had any lab tests, please let us know if you have not heard back within a few days. You may see your results on mychart before we have a chance to review them but we will give you a call once they are reviewed by [place].   If we ordered any referrals today, please let us know if you have not heard from their office within the next week.   If you had any urgent prescriptions sent in today, please check with the pharmacy within an hour of our visit to make sure the prescription was transmitted appropriately.   Please try these tips to maintain a healthy lifestyle:  Eat at least 3 REAL meals and 1-2 snacks per day.  Aim for no more than 5 hours between eating.  If you eat breakfast, please do so within one hour of getting up.   Each meal should contain half fruits/vegetables, one quarter protein, and one quarter carbs (no bigger than a computer mouse)  Cut down on sweet beverages. This includes juice, soda, and sweet tea.   Drink at least 1 glass of water with each meal and aim for at least 8 glasses per day  Exercise at least 150 minutes every week.

## 2022-02-03 NOTE — Assessment & Plan Note (Signed)
Blood pressure at goal today on losartan 100 mg daily. 

## 2022-02-03 NOTE — Assessment & Plan Note (Signed)
Check A1c when he comes back in for CPE later this year.

## 2022-02-03 NOTE — Assessment & Plan Note (Signed)
Check lipids when he comes back in for CPE later this year.

## 2022-02-15 NOTE — Telephone Encounter (Signed)
Rx Denied

## 2022-03-10 ENCOUNTER — Encounter: Payer: Self-pay | Admitting: Family Medicine

## 2022-03-10 ENCOUNTER — Ambulatory Visit (INDEPENDENT_AMBULATORY_CARE_PROVIDER_SITE_OTHER): Payer: PPO | Admitting: Family Medicine

## 2022-03-10 VITALS — BP 120/64 | HR 85 | Temp 97.8°F | Ht 71.0 in | Wt 243.4 lb

## 2022-03-10 DIAGNOSIS — I10 Essential (primary) hypertension: Secondary | ICD-10-CM | POA: Diagnosis not present

## 2022-03-10 DIAGNOSIS — E038 Other specified hypothyroidism: Secondary | ICD-10-CM | POA: Diagnosis not present

## 2022-03-10 MED ORDER — AMOXICILLIN-POT CLAVULANATE 875-125 MG PO TABS: 1.0000 | ORAL_TABLET | Freq: Two times a day (BID) | ORAL | 0 refills | Status: DC

## 2022-03-10 NOTE — Assessment & Plan Note (Signed)
No longer on synthroid.  Will recheck TSH at next CPE.

## 2022-03-10 NOTE — Assessment & Plan Note (Signed)
At goal on on losartan 100 mg daily.

## 2022-03-10 NOTE — Patient Instructions (Signed)
It was very nice to see you today!  You have a sinus infection  Your usually can clear this on its own after 7 to 10 days.  Please start the antibiotic if not improving in the next few days.  Take care, Dr [name]  PLEASE NOTE:  If you had any lab tests, please let us know if you have not heard back within a few days. You may see your results on mychart before we have a chance to review them but we will give you a call once they are reviewed by [place].   If we ordered any referrals today, please let us know if you have not heard from their office within the next week.   If you had any urgent prescriptions sent in today, please check with the pharmacy within an hour of our visit to make sure the prescription was transmitted appropriately.   Please try these tips to maintain a healthy lifestyle:  Eat at least 3 REAL meals and 1-2 snacks per day.  Aim for no more than 5 hours between eating.  If you eat breakfast, please do so within one hour of getting up.   Each meal should contain half fruits/vegetables, one quarter protein, and one quarter carbs (no bigger than a computer mouse)  Cut down on sweet beverages. This includes juice, soda, and sweet tea.   Drink at least 1 glass of water with each meal and aim for at least 8 glasses per day  Exercise at least 150 minutes every week.

## 2022-03-10 NOTE — Progress Notes (Signed)
   Dale Wood is a 72 y.o. male who presents today for an office visit.  Assessment/Plan:  New/Acute Problems: Sinusitis  No red flags.  No signs of systemic illness.  No signs of recurrence of pneumonia.  Will send in pocket prescription for Augmentin with instruction to not start unless symptoms or not improving in the next couple of days.  He can use over-the-counter meds as well.  Encouraged hydration.  We discussed reasons to return to care.  Follow-up as needed.  Chronic Problems Addressed Today: Hypertension At goal on on losartan 100 mg daily.  Subclinical hypothyroidism No longer on synthroid.  Will recheck TSH at next CPE.     Subjective:  HPI:  Patient here with cough and congestion. Started about a week ago.  No fevers or chills. Breathing has been stable. No chest pain.  A lot of drainage.  No known sick contacts.  He had pneumonia couple months ago and is worried about recurrence.       Objective:  Physical Exam: BP 120/64 (BP Location: Left Arm, Patient Position: Sitting, Cuff Size: Large)   Pulse 85   Temp 97.8 F (36.6 C) (Temporal)   Ht 5' 11"$  (1.803 m)   Wt 243 lb 6.1 oz (110.4 kg)   SpO2 94%   BMI 33.94 kg/m   Wt Readings from Last 3 Encounters:  03/10/22 243 lb 6.1 oz (110.4 kg)  02/03/22 242 lb 3.2 oz (109.9 kg)  01/21/22 235 lb 6.4 oz (106.8 kg)    Gen: No acute distress, resting comfortably HEENT: TMs with clear effusion.  Nasal mucosa erythematous and boggy bilaterally. CV: Regular rate and rhythm with no murmurs appreciated Pulm: Normal work of breathing, clear to auscultation bilaterally with no crackles, wheezes, or rhonchi Neuro: Grossly normal, moves all extremities Psych: Normal affect and thought content      Mariela Rex M. [name], MD 03/10/2022 1:24 PM

## 2022-04-02 ENCOUNTER — Other Ambulatory Visit: Payer: Self-pay | Admitting: Family Medicine

## 2022-04-07 DIAGNOSIS — M25561 Pain in right knee: Secondary | ICD-10-CM | POA: Diagnosis not present

## 2022-04-07 DIAGNOSIS — M25562 Pain in left knee: Secondary | ICD-10-CM | POA: Diagnosis not present

## 2022-05-26 DIAGNOSIS — M25551 Pain in right hip: Secondary | ICD-10-CM | POA: Diagnosis not present

## 2022-06-01 DIAGNOSIS — M25551 Pain in right hip: Secondary | ICD-10-CM | POA: Diagnosis not present

## 2022-06-01 IMAGING — DX DG LUMBAR SPINE COMPLETE 4+V
5 series · 5 of 5 positions shown · non-contrast
Comparison: CT scan March 22, 2018

CLINICAL DATA: Acute on chronic left back pain.

EXAM:
LUMBAR SPINE - COMPLETE 4+ VIEW

[l-spine ap]
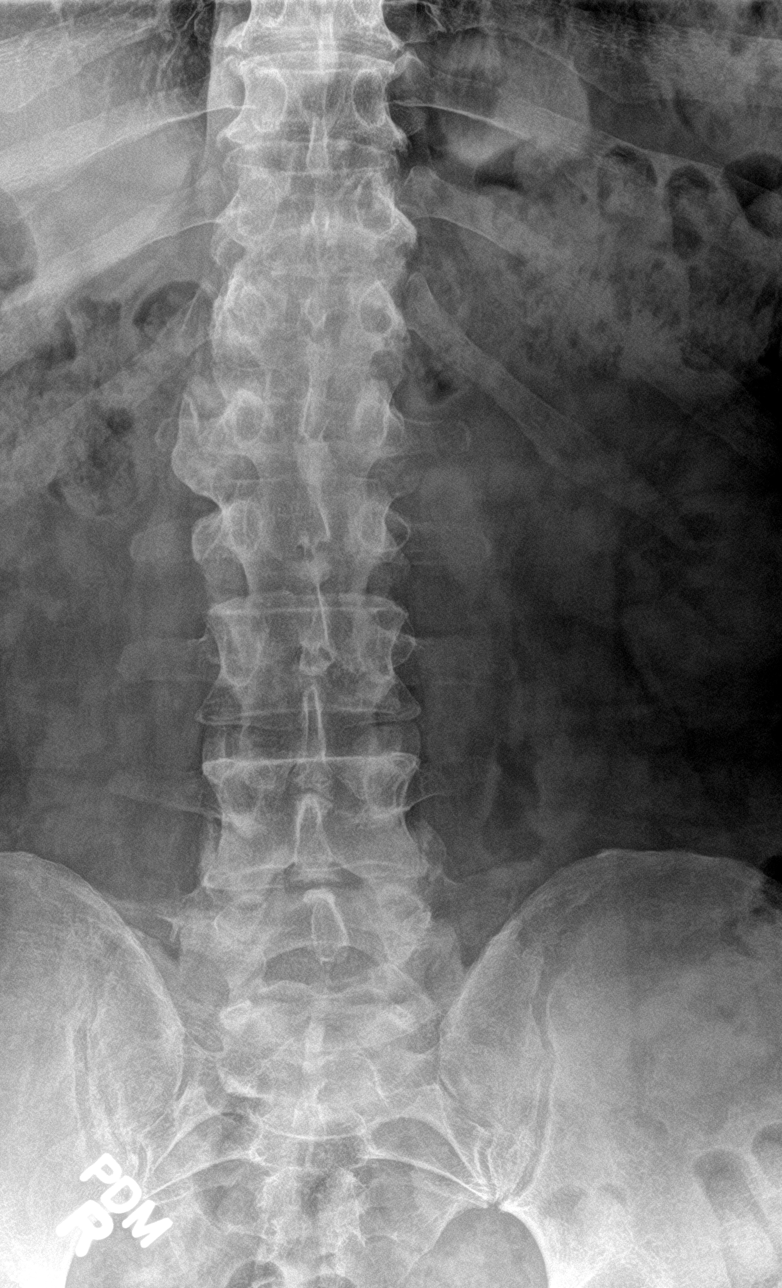

[l-spine obl (1 of 2)]
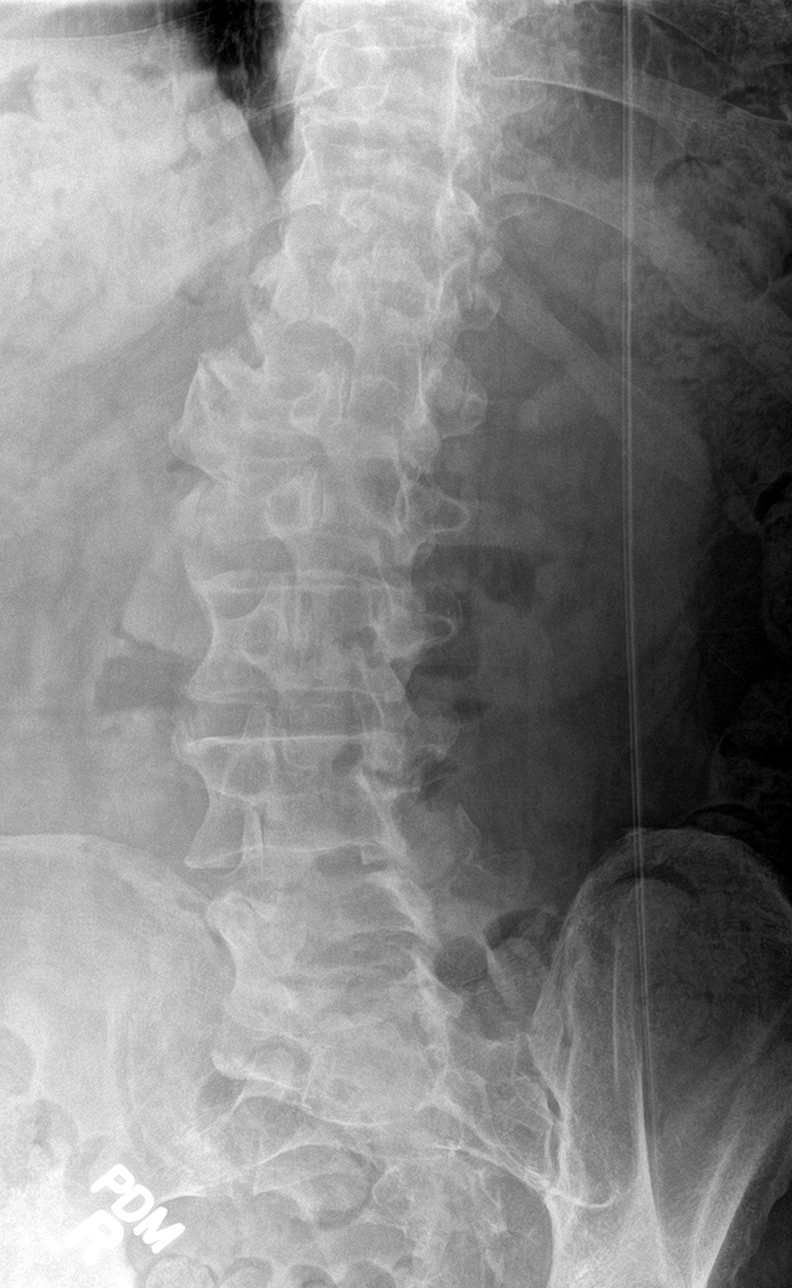

[l-spine obl (2 of 2)]
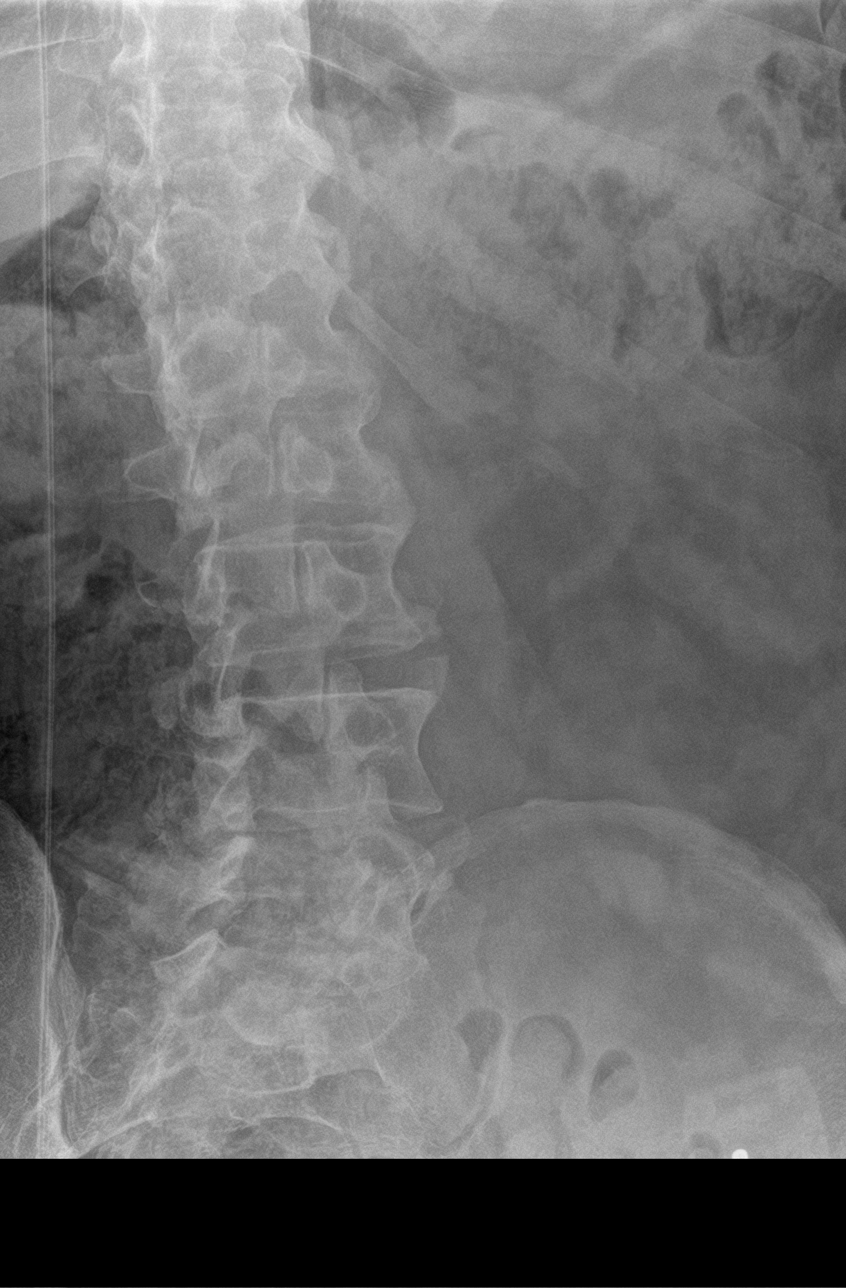

[l-spine lat]
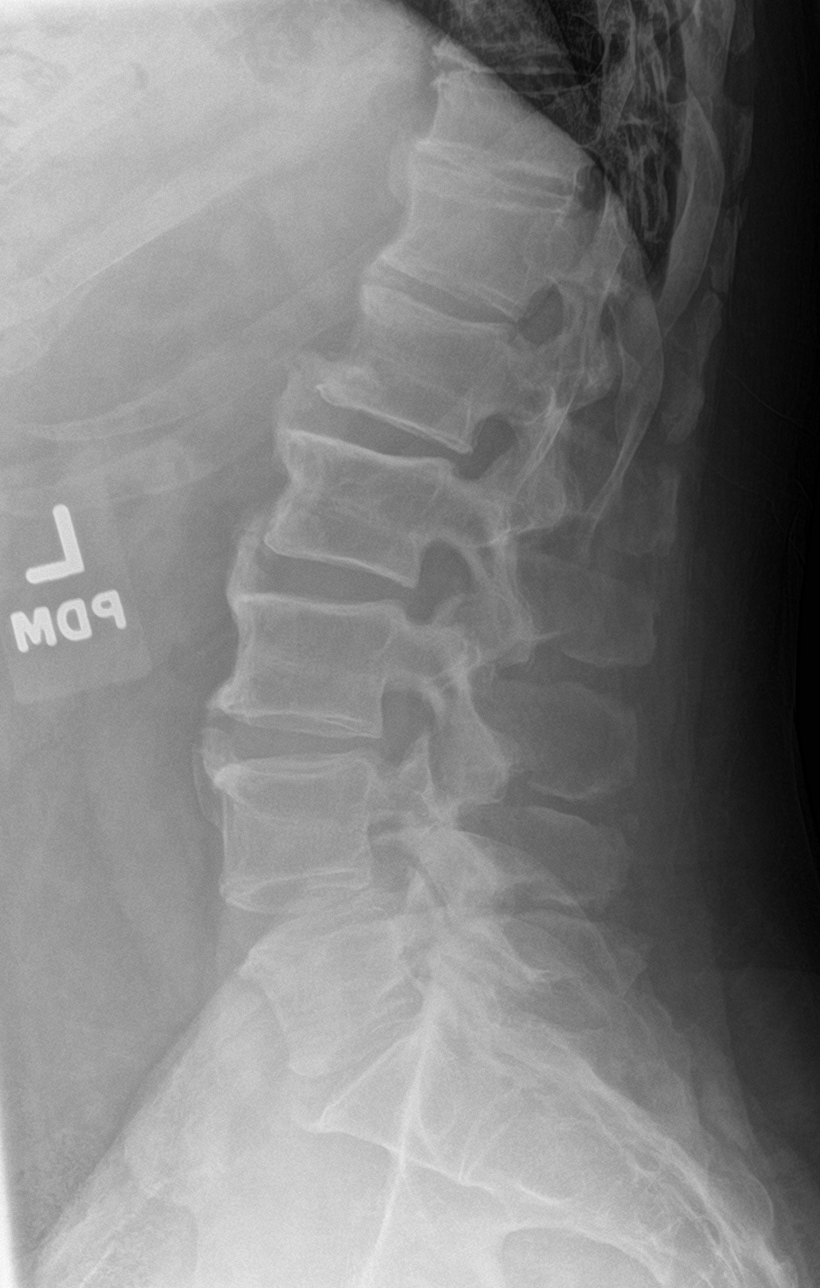

[l-spine spot]
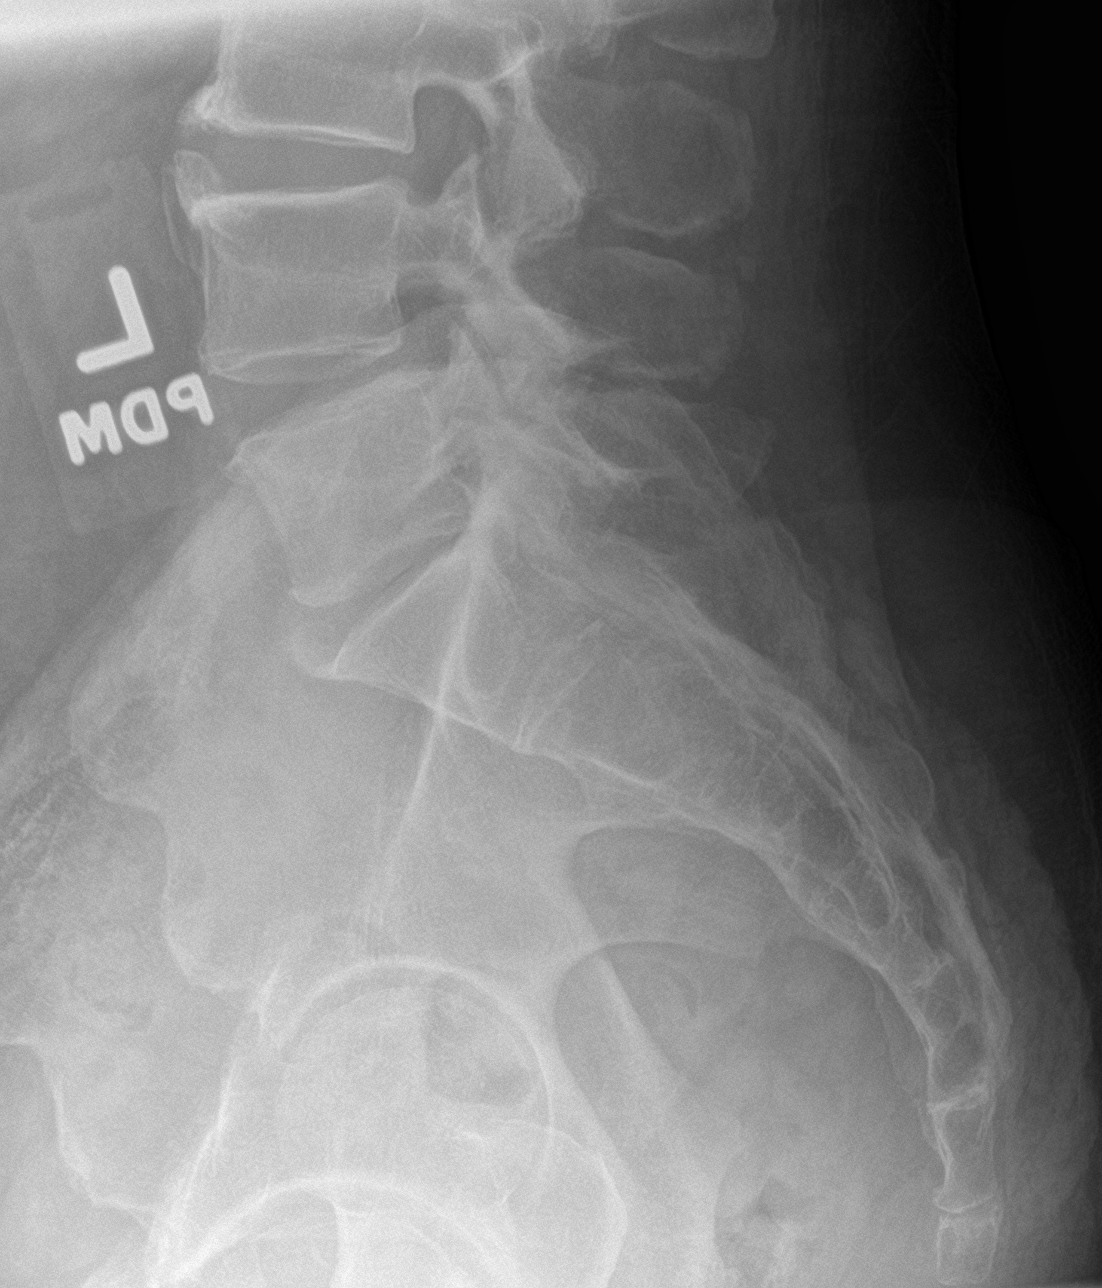

[5 of 5 positions shown; findings below may reference images not displayed]

FINDINGS: Mild anterior wedging of T11 is unchanged. No acute fractures or
traumatic malalignment. Multilevel degenerative disc disease with
anterior osteophytes. Lower lumbar facet degenerative changes. No
other abnormalities.
IMPRESSION: Multilevel degenerative disc disease and lower lumbar facet
degenerative changes.

## 2022-06-22 DIAGNOSIS — H903 Sensorineural hearing loss, bilateral: Secondary | ICD-10-CM | POA: Diagnosis not present

## 2022-07-27 DIAGNOSIS — H903 Sensorineural hearing loss, bilateral: Secondary | ICD-10-CM | POA: Diagnosis not present

## 2022-08-09 ENCOUNTER — Telehealth: Payer: Self-pay | Admitting: *Deleted

## 2022-08-09 NOTE — Telephone Encounter (Signed)
I connected with Dale Wood wife on 7/23 at 1142 by telephone and verified that I am speaking with the correct person using two identifiers. According to the patient's chart they are due for physical in sept with [facility]. Will call pt back tomorrow after 3pm when he is home Nothing further was needed at the end of our conversation.

## 2022-09-27 ENCOUNTER — Ambulatory Visit (INDEPENDENT_AMBULATORY_CARE_PROVIDER_SITE_OTHER): Payer: PPO

## 2022-09-27 VITALS — Wt 243.0 lb

## 2022-09-27 DIAGNOSIS — Z0000 Encounter for general adult medical examination without abnormal findings: Secondary | ICD-10-CM

## 2022-09-27 NOTE — Progress Notes (Signed)
Subjective:   Dale Wood is a 72 y.o. male who presents for Medicare Annual/Subsequent preventive examination.  Visit Complete: Virtual  I connected with  [name] on 09/27/22 by a audio enabled telemedicine application and verified that I am speaking with the correct person using two identifiers.  Patient Location: Home  Provider Location: Office/Clinic  I discussed the limitations of evaluation and management by telemedicine. The patient expressed understanding and agreed to proceed.   Vital Signs: Unable to obtain new vitals due to this being a telehealth visit.   Review of Systems     Cardiac Risk Factors include: advanced age (>77men, >60 women);dyslipidemia;hypertension;obesity (BMI >30kg/m2);male gender     Objective:    Today's Vitals   09/27/22 1516  Weight: 243 lb (110.2 kg)   Body mass index is 33.89 kg/m.     09/27/2022    3:19 PM 01/13/2022    6:51 PM 01/04/2022   10:33 PM 12/06/2021    3:27 PM 11/12/2020    3:20 PM 03/18/2020   12:58 PM 11/07/2019    3:16 PM  Advanced Directives  Does Patient Have a Medical Advance Directive? No No No No Yes No Yes  Does patient want to make changes to medical advance directive?     No - Patient declined  Yes (MAU/Ambulatory/Procedural Areas - Information given)  Would patient like information on creating a medical advance directive? No - Patient declined No - Patient declined No - Patient declined No - Patient declined  No - Patient declined     Current Medications (verified) Outpatient Encounter Medications as of 09/27/2022  Medication Sig   losartan (COZAAR) 100 MG tablet TAKE 1 TABLET(100 MG) BY MOUTH DAILY   Multiple Vitamins-Minerals (ONE-A-DAY MENS 50+) TABS Take 1 tablet by mouth daily.   sildenafil (VIAGRA) 100 MG tablet Take 0.5-1 tablets (50-100 mg total) by mouth daily as needed for erectile dysfunction.   furosemide (LASIX) 40 MG tablet Take 1 tablet (40 mg total) by mouth daily. (Patient not  taking: Reported on 09/27/2022)   [DISCONTINUED] amoxicillin-clavulanate (AUGMENTIN) 875-125 MG tablet Take 1 tablet by mouth 2 (two) times daily.   No facility-administered encounter medications on file as of 09/27/2022.    Allergies (verified) Androgel [testosterone] and Penicillins   History: Past Medical History:  Diagnosis Date   Abdominal pain, epigastric 10/27/2008   Qualifier: Diagnosis of  By: Yetta Barre MD, Dorothey Baseman INJURY, RIGHT 09/22/2009   Qualifier: Diagnosis of  By: Riley Lam, CMA, [name]     Basal cell carcinoma 10/29/2012   left forearm tx cx3 3fu   BASAL CELL CARCINOMA, FACE 10/27/2008   Qualifier: Diagnosis of  By: Yetta Barre MD, Bernadene Bell.    BASAL CELL CARCINOMA, FACE 10/27/2008   Qualifier: Diagnosis of By: Yetta Barre MD, Bernadene Bell.    Cancer [facility])    Skin Cancer on Nose   CELLULITIS, FOOT 09/22/2009   Qualifier: Diagnosis of  By: Yetta Barre MD, Bernadene Bell.    Constipation 01/05/2017   DYSMETABOLIC SYNDROME 02/05/2009   Qualifier: Diagnosis of  By: Yetta Barre MD, Bernadene Bell.    DYSPEPSIA 10/27/2008   Qualifier: Diagnosis of  By: Yetta Barre MD, Bernadene Bell.    ERECTILE DYSFUNCTION, ORGANIC 10/27/2008   Qualifier: Diagnosis of  By: Yetta Barre MD, Bernadene Bell.    Headache 07/30/2015   Heart murmur    History of kidney stones    about 30 passed   Hyperlipidemia    HYPERLIPIDEMIA 10/27/2008   Qualifier: Diagnosis  of  By: Yetta Barre MD, Bernadene Bell.    Hypertension    HYPOGONADISM 11/26/2008   Qualifier: Diagnosis of  By: Yetta Barre MD, Bernadene Bell.    Hypothyroidism    Kidney stones    Low testosterone    NEPHROLITHIASIS, HX OF 10/27/2008   Qualifier: Diagnosis of  By: Yetta Barre MD, Bernadene Bell.    Nephrolithiasis, uric acid    OSTEOARTHRITIS 10/27/2008   Qualifier: Diagnosis of  By: Yetta Barre MD, Bernadene Bell.    Positive TB test    Postherpetic neuralgia 03/27/2017   Rheumatic fever    Rheumatic fever    SKIN CANCER, HX OF 10/27/2008   Qualifier: Diagnosis of  By: Yetta Barre MD, Bernadene Bell.    Squamous cell carcinoma of skin  11/27/2013   left shin tx with bx curet and 5 fu   Tachycardia 11/09/2017   Thyroid disease    Tuberculosis 1986 ish   treated   Unspecified disorder of kidney and ureter    URI 02/05/2009   Qualifier: Diagnosis of  By: Yetta Barre MD, Bernadene Bell.    Past Surgical History:  Procedure Laterality Date   CHOLECYSTECTOMY N/A 03/18/2020   Procedure: LAPAROSCOPIC CHOLECYSTECTOMY;  Surgeon: Abigail Miyamoto, MD;  Location: [facility] OR;  Service: General;  Laterality: N/A;  60 MIN TOTAL - ROOM 10   KNEE SURGERY Right    2005, 2008. Scope for MCL tear   TONSILLECTOMY AND ADENOIDECTOMY     UMBILICAL HERNIA REPAIR N/A 03/18/2020   Procedure: HERNIA REPAIR UMBILICAL ADULT;  Surgeon: Abigail Miyamoto, MD;  Location: [facility] OR;  Service: General;  Laterality: N/A;   Family History  Problem Relation Age of Onset   Cancer Mother    Diabetes Mother    Cancer Father    Early death Father    Diabetes Brother    Cancer Brother        prostate   Drug abuse Brother    Birth defects Sister    Diabetes Maternal Grandmother    Thyroid disease Maternal Grandmother    Stroke Maternal Grandmother    Tremor Maternal Grandfather    Obesity Son    Social History   Socioeconomic History   Marital status: Married    Spouse name: Not on file   Number of children: Not on file   Years of education: Not on file   Highest education level: Not on file  Occupational History   Not on file  Tobacco Use   Smoking status: Former    Types: Software engineer, Pipe    Quit date: 2001    Years since quitting: 23.7   Smokeless tobacco: Never  Vaping Use   Vaping status: Never Used  Substance and Sexual Activity   Alcohol use: Yes    Comment: 1-2 times a year   Drug use: No   Sexual activity: Yes  Other Topics Concern   Not on file  Social History Narrative   Not on file   Social Determinants of Health   Financial Resource Strain: Low Risk  (09/27/2022)   Overall Financial Resource Strain (CARDIA)    Difficulty of Paying Living  Expenses: Not hard at all  Food Insecurity: No Food Insecurity (09/27/2022)   Hunger Vital Sign    Worried About Running Out of Food in the Last Year: Never true    Ran Out of Food in the Last Year: Never true  Transportation Needs: No Transportation Needs (09/27/2022)   PRAPARE - Administrator, Civil Service (Medical): No  Lack of Transportation (Non-Medical): No  Physical Activity: Sufficiently Active (09/27/2022)   Exercise Vital Sign    Days of Exercise per Week: 3 days    Minutes of Exercise per Session: 60 min  Stress: No Stress Concern Present (09/27/2022)   [employer] of Occupational Health - Occupational Stress Questionnaire    Feeling of Stress : Not at all  Social Connections: Socially Integrated (09/27/2022)   Social Connection and Isolation Panel [NHANES]    Frequency of Communication with Friends and Family: Once a week    Frequency of Social Gatherings with Friends and Family: More than three times a week    Attends Religious Services: More than 4 times per year    Active Member of [employer] or Organizations: Yes    Attends Banker Meetings: 1 to 4 times per year    Marital Status: Married    Tobacco Counseling Counseling given: Not Answered   Clinical Intake:  Pre-visit preparation completed: Yes  Pain : No/denies pain     BMI - recorded: 33.89 Nutritional Status: BMI > 30  Obese Nutritional Risks: None Diabetes: No  How often do you need to have someone help you when you read instructions, pamphlets, or other written materials from your doctor or pharmacy?: 1 - Never  Interpreter Needed?: No  Information entered by :: Lanier Ensign, LPN   Activities of Daily Living    09/27/2022    3:18 PM 12/06/2021    3:29 PM  In your present state of health, do you have any difficulty performing the following activities:  Hearing? 1 0  Comment wears hearing aids   Vision? 0 0  Difficulty concentrating or making decisions? 0 0   Walking or climbing stairs? 0 0  Dressing or bathing? 0 0  Doing errands, shopping? 0 0  Preparing Food and eating ? N N  Using the Toilet? N N  In the past six months, have you accidently leaked urine? N N  Do you have problems with loss of bowel control? N N  Managing your Medications? N N  Managing your Finances? N N  Housekeeping or managing your Housekeeping? N N    Patient Care Team: Ardith Dark, MD as PCP - General (Family Medicine) Wendall Stade, MD as Consulting Physician (Cardiology) Davina Poke as Consulting Physician (Optometry) Dahlia Byes, [facility] (Inactive) as Pharmacist (Pharmacist) Janalyn Harder, MD (Inactive) as Consulting Physician (Dermatology)  Indicate any recent Medical Services you may have received from other than [facility] providers in the past year (date may be approximate).     Assessment:   This is a routine wellness examination for Bub.  Hearing/Vision screen Hearing Screening - Comments:: Pt wears hearing aids  Vision Screening - Comments:: Pt follows up with Dr Shea Evans for annual eye exams    Goals Addressed             This Visit's Progress    Patient Stated       Stay healthy        Depression Screen    09/27/2022    3:20 PM 02/03/2022    2:02 PM 01/21/2022    2:31 PM 12/06/2021    3:20 PM 08/13/2021    2:55 PM 07/19/2021    2:32 PM 11/12/2020    3:19 PM  PHQ 2/9 Scores  PHQ - 2 Score 0 0 0 0 0 0 0    Fall Risk    09/27/2022    3:22 PM 02/03/2022  2:02 PM 01/21/2022    2:31 PM 12/06/2021    3:29 PM 08/13/2021    2:55 PM  Fall Risk   Falls in the past year? 0 0 0 0 0  Number falls in past yr: 0 0 0 0 0  Injury with Fall? 0 0 0 0 0  Risk for fall due to : Impaired vision No Fall Risks No Fall Risks Impaired vision No Fall Risks  Follow up Falls prevention discussed   Falls prevention discussed     MEDICARE RISK AT HOME: Medicare Risk at Home Any stairs in or around the home?: Yes If so, are there any without  handrails?: No Home free of loose throw rugs in walkways, pet beds, electrical cords, etc?: Yes Adequate lighting in your home to reduce risk of falls?: Yes Life alert?: No Use of a cane, walker or w/c?: No Grab bars in the bathroom?: Yes Shower chair or bench in shower?: No Elevated toilet seat or a handicapped toilet?: No  TIMED UP AND GO:  Was the test performed?  No    Cognitive Function:        09/27/2022    3:23 PM 12/06/2021    3:29 PM 11/12/2020    3:23 PM 11/07/2019    3:19 PM 09/18/2018    3:29 PM  6CIT Screen  What Year? 0 points 0 points 0 points 0 points 0 points  What month? 0 points 0 points 0 points 0 points 0 points  What time? 0 points 0 points 0 points  0 points  Count back from 20 0 points 0 points 0 points 0 points 0 points  Months in reverse 0 points 0 points 0 points 0 points 0 points  Repeat phrase 0 points 0 points 0 points 0 points   Total Score 0 points 0 points 0 points      Immunizations Immunization History  Administered Date(s) Administered   Fluad Quad(high Dose 65+) 09/18/2018   Influenza, High Dose Seasonal PF 11/22/2016   Influenza-Unspecified 09/17/2016   PFIZER(Purple Top)SARS-COV-2 Vaccination 03/30/2019, 04/20/2019, 11/05/2019, 05/03/2020, 07/05/2021   Pfizer Covid-19 Vaccine Bivalent Booster 40yrs & up 12/11/2020   Pneumococcal Conjugate-13 11/22/2016   Pneumococcal Polysaccharide-23 01/15/2019   Td 01/17/2001   Tdap 08/25/2010   Zoster Recombinant(Shingrix) 10/25/2019, 12/25/2019    TDAP status: Due, Education has been provided regarding the importance of this vaccine. Advised may receive this vaccine at local pharmacy or Health Dept. Aware to provide a copy of the vaccination record if obtained from local pharmacy or Health Dept. Verbalized acceptance and understanding.  Flu Vaccine status: Due, Education has been provided regarding the importance of this vaccine. Advised may receive this vaccine at local pharmacy or Health  Dept. Aware to provide a copy of the vaccination record if obtained from local pharmacy or Health Dept. Verbalized acceptance and understanding.  Pneumococcal vaccine status: Up to date  Covid-19 vaccine status: Declined, Education has been provided regarding the importance of this vaccine but patient still declined. Advised may receive this vaccine at local pharmacy or Health Dept.or vaccine clinic. Aware to provide a copy of the vaccination record if obtained from local pharmacy or Health Dept. Verbalized acceptance and understanding.  Qualifies for Shingles Vaccine? Yes   Zostavax completed Yes   Shingrix Completed?: Yes  Screening Tests Health Maintenance  Topic Date Due   DTaP/Tdap/Td (3 - Td or Tdap) 08/24/2020   Colonoscopy  10/11/2020   COVID-19 Vaccine (7 - 2023-24 season) 09/18/2022   INFLUENZA  VACCINE  04/17/2023 (Originally 08/18/2022)   Medicare Annual Wellness (AWV)  09/27/2023   Pneumonia Vaccine 40+ Years old  Completed   Hepatitis C Screening  Completed   Zoster Vaccines- Shingrix  Completed   HPV VACCINES  Aged Out    Health Maintenance  Health Maintenance Due  Topic Date Due   DTaP/Tdap/Td (3 - Td or Tdap) 08/24/2020   Colonoscopy  10/11/2020   COVID-19 Vaccine (7 - 2023-24 season) 09/18/2022    Colorectal cancer screening: Type of screening: Colonoscopy. Completed 10/12/10. Repeat every 10 years provider may need to address    Additional Screening:  Hepatitis C Screening: Completed 11/07/15  Vision Screening: Recommended annual ophthalmology exams for early detection of glaucoma and other disorders of the eye. Is the patient up to date with their annual eye exam?  Yes  Who is the provider or what is the name of the office in which the patient attends annual eye exams? Dr Shea Evans  If pt is not established with a provider, would they like to be referred to a provider to establish care? No .   Dental Screening: Recommended annual dental exams for proper oral  hygiene    Community Resource Referral / Chronic Care Management: CRR required this visit?  No   CCM required this visit?  No     Plan:     I have personally reviewed and noted the following in the patient's chart:   Medical and social history Use of alcohol, tobacco or illicit drugs  Current medications and supplements including opioid prescriptions. Patient is not currently taking opioid prescriptions. Functional ability and status Nutritional status Physical activity Advanced directives List of other physicians Hospitalizations, surgeries, and ER visits in previous 12 months Vitals Screenings to include cognitive, depression, and falls Referrals and appointments  In addition, I have reviewed and discussed with patient certain preventive protocols, quality metrics, and best practice recommendations. A written personalized care plan for preventive services as well as general preventive health recommendations were provided to patient.     Marzella Schlein, LPN   05/02/6061   After Visit Summary: (MyChart) Due to this being a telephonic visit, the after visit summary with patients personalized plan was offered to patient via MyChart   Nurse Notes: Pt stated he will follow up at later date for flu vaccine and Tdap with Pharmacy

## 2022-09-27 NOTE — Patient Instructions (Signed)
Dale Wood , Thank you for taking time to come for your Medicare Wellness Visit. I appreciate your ongoing commitment to your health goals. Please review the following plan we discussed and let me know if I can assist you in the future.   Referrals/Orders/Follow-Ups/Clinician Recommendations: stay healthy   This is a list of the screening recommended for you and due dates:  Health Maintenance  Topic Date Due   DTaP/Tdap/Td vaccine (3 - Td or Tdap) 08/24/2020   Colon Cancer Screening  10/11/2020   Flu Shot  08/18/2022   COVID-19 Vaccine (7 - 2023-24 season) 09/18/2022   Medicare Annual Wellness Visit  12/07/2022   Pneumonia Vaccine  Completed   Hepatitis C Screening  Completed   Zoster (Shingles) Vaccine  Completed   HPV Vaccine  Aged Out    Advanced directives: (Copy Requested) Please bring a copy of your health care power of attorney and living will to the office to be added to your chart at your convenience.  Next Medicare Annual Wellness Visit scheduled for next year: Yes

## 2022-10-02 DIAGNOSIS — M25561 Pain in right knee: Secondary | ICD-10-CM | POA: Diagnosis not present

## 2022-10-06 DIAGNOSIS — S83242A Other tear of medial meniscus, current injury, left knee, initial encounter: Secondary | ICD-10-CM | POA: Diagnosis not present

## 2022-10-06 DIAGNOSIS — M25561 Pain in right knee: Secondary | ICD-10-CM | POA: Diagnosis not present

## 2022-11-14 DIAGNOSIS — L570 Actinic keratosis: Secondary | ICD-10-CM | POA: Diagnosis not present

## 2022-11-14 DIAGNOSIS — D485 Neoplasm of uncertain behavior of skin: Secondary | ICD-10-CM | POA: Diagnosis not present

## 2022-11-14 DIAGNOSIS — C44619 Basal cell carcinoma of skin of left upper limb, including shoulder: Secondary | ICD-10-CM | POA: Diagnosis not present

## 2022-11-29 DIAGNOSIS — G8918 Other acute postprocedural pain: Secondary | ICD-10-CM | POA: Diagnosis not present

## 2022-11-29 DIAGNOSIS — S83241A Other tear of medial meniscus, current injury, right knee, initial encounter: Secondary | ICD-10-CM | POA: Diagnosis not present

## 2022-11-29 DIAGNOSIS — Y999 Unspecified external cause status: Secondary | ICD-10-CM | POA: Diagnosis not present

## 2022-11-29 DIAGNOSIS — X58XXXA Exposure to other specified factors, initial encounter: Secondary | ICD-10-CM | POA: Diagnosis not present

## 2022-11-29 DIAGNOSIS — M23321 Other meniscus derangements, posterior horn of medial meniscus, right knee: Secondary | ICD-10-CM | POA: Diagnosis not present

## 2022-12-13 DIAGNOSIS — C44619 Basal cell carcinoma of skin of left upper limb, including shoulder: Secondary | ICD-10-CM | POA: Diagnosis not present

## 2022-12-26 IMAGING — DX DG CHEST 1V PORT
1 series · 1 of 1 positions shown · non-contrast
Comparison: 11/08/2017

CLINICAL DATA: Chest pain, vomiting

EXAM:
PORTABLE CHEST 1 VIEW

[chest ap]
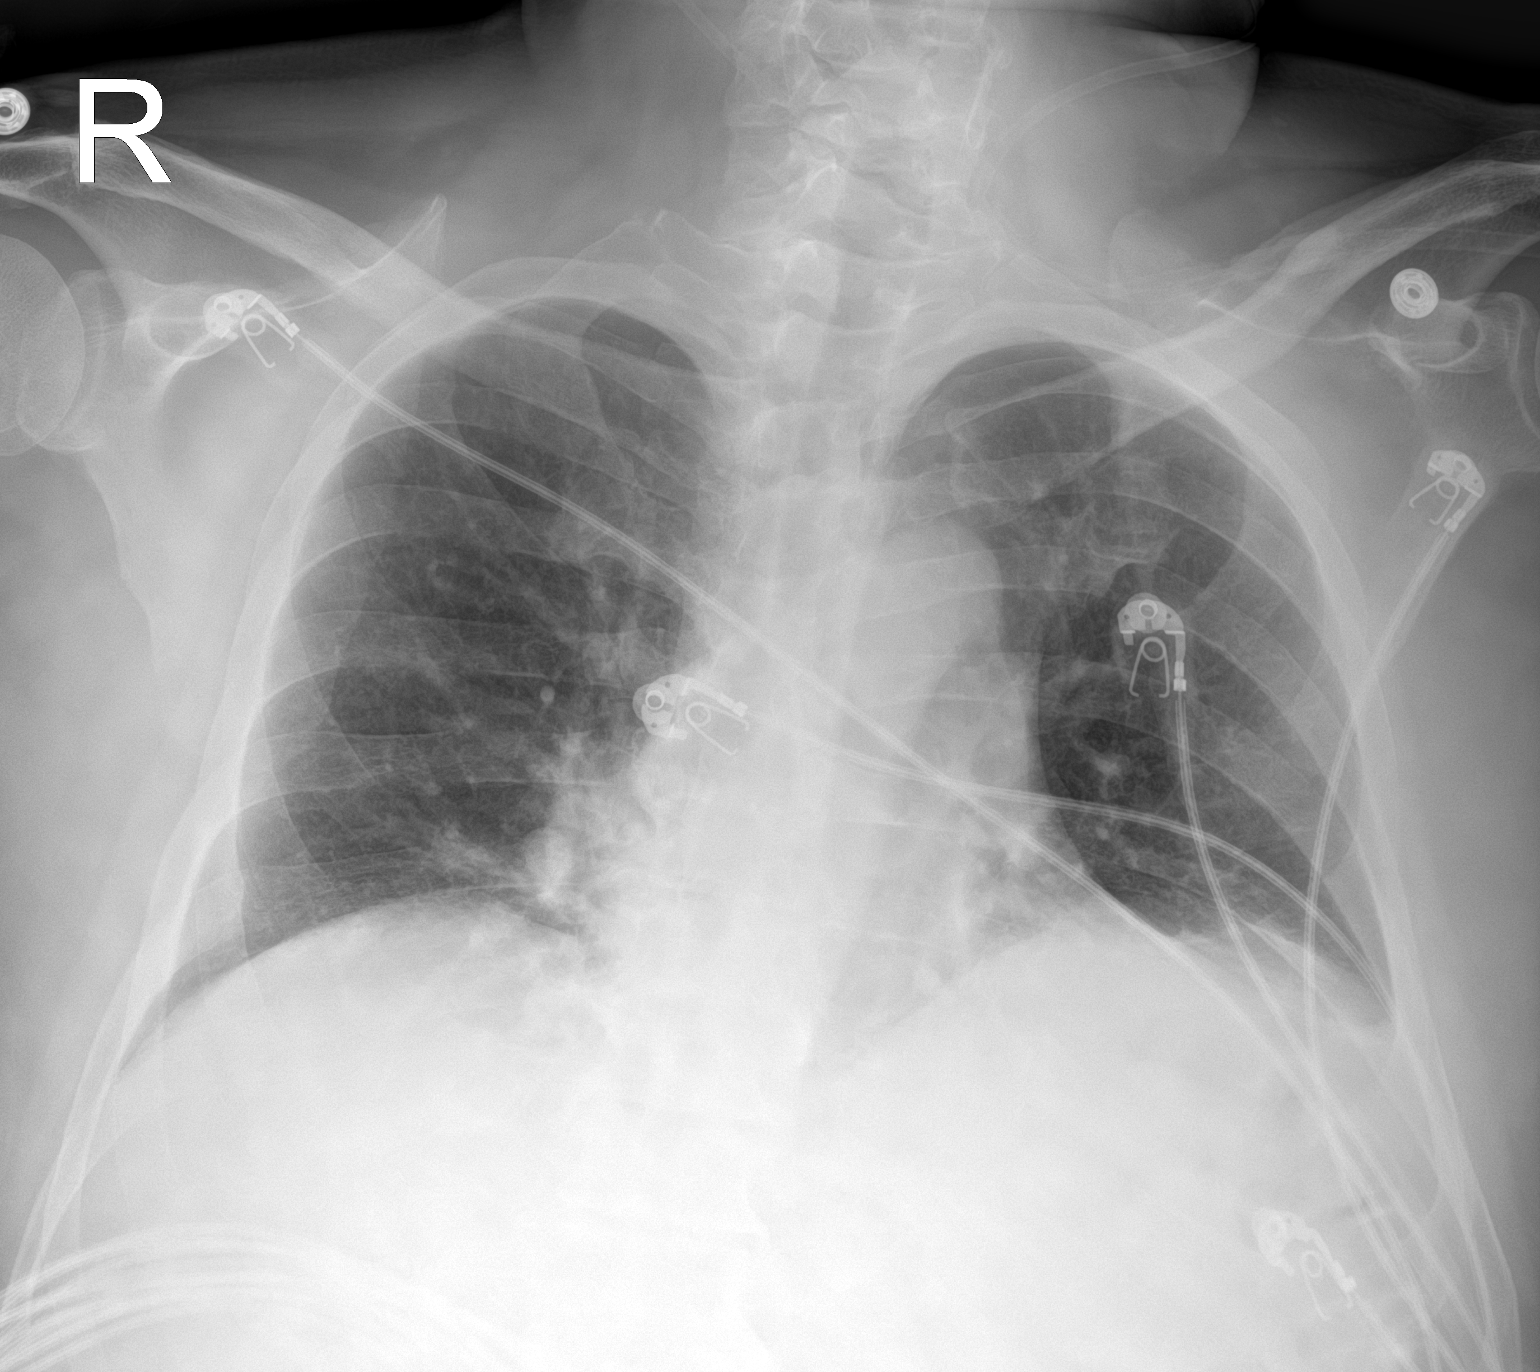

[1 of 1 positions shown; findings below may reference images not displayed]

FINDINGS: Low lung volumes, bibasilar atelectasis. Heart is normal size. No
effusions. No acute bony abnormality.
IMPRESSION: Low lung volumes, bibasilar atelectasis.

## 2023-01-13 DIAGNOSIS — Z471 Aftercare following joint replacement surgery: Secondary | ICD-10-CM | POA: Diagnosis not present

## 2023-01-13 DIAGNOSIS — Z96651 Presence of right artificial knee joint: Secondary | ICD-10-CM | POA: Diagnosis not present

## 2023-01-20 ENCOUNTER — Telehealth: Payer: Self-pay | Admitting: *Deleted

## 2023-01-20 NOTE — Telephone Encounter (Signed)
 Copied from CRM [id number]. Topic: Clinical - Medical Advice >> Jan 19, 2023  4:01 PM Viola F wrote: Reason for CRM: Patient believes he has a UTI - refused next available appt on Monday - would like a call back   Advise patient to schedule an office visit or go to [facility] for assessment and treatment  Deaveon Schoen,RMA

## 2023-03-09 DIAGNOSIS — Z9889 Other specified postprocedural states: Secondary | ICD-10-CM | POA: Diagnosis not present

## 2023-03-09 DIAGNOSIS — M1711 Unilateral primary osteoarthritis, right knee: Secondary | ICD-10-CM | POA: Diagnosis not present

## 2023-06-01 DIAGNOSIS — M1712 Unilateral primary osteoarthritis, left knee: Secondary | ICD-10-CM | POA: Diagnosis not present

## 2023-06-01 DIAGNOSIS — M1711 Unilateral primary osteoarthritis, right knee: Secondary | ICD-10-CM | POA: Diagnosis not present

## 2023-06-16 ENCOUNTER — Ambulatory Visit: Payer: Self-pay | Admitting: Family Medicine

## 2023-06-16 ENCOUNTER — Ambulatory Visit (INDEPENDENT_AMBULATORY_CARE_PROVIDER_SITE_OTHER): Admitting: Family Medicine

## 2023-06-16 ENCOUNTER — Encounter: Payer: Self-pay | Admitting: Family Medicine

## 2023-06-16 VITALS — BP 198/80 | HR 61 | Temp 97.7°F | Ht 71.0 in | Wt 246.2 lb

## 2023-06-16 DIAGNOSIS — I10 Essential (primary) hypertension: Secondary | ICD-10-CM | POA: Diagnosis not present

## 2023-06-16 DIAGNOSIS — R600 Localized edema: Secondary | ICD-10-CM | POA: Diagnosis not present

## 2023-06-16 DIAGNOSIS — E038 Other specified hypothyroidism: Secondary | ICD-10-CM

## 2023-06-16 DIAGNOSIS — R739 Hyperglycemia, unspecified: Secondary | ICD-10-CM | POA: Diagnosis not present

## 2023-06-16 DIAGNOSIS — R351 Nocturia: Secondary | ICD-10-CM

## 2023-06-16 DIAGNOSIS — E785 Hyperlipidemia, unspecified: Secondary | ICD-10-CM | POA: Diagnosis not present

## 2023-06-16 LAB — COMPREHENSIVE METABOLIC PANEL WITH GFR
ALT: 17 U/L (ref 0–53)
AST: 17 U/L (ref 0–37)
Albumin: 4 g/dL (ref 3.5–5.2)
Alkaline Phosphatase: 98 U/L (ref 39–117)
BUN: 21 mg/dL (ref 6–23)
CO2: 30 meq/L (ref 19–32)
Calcium: 9 mg/dL (ref 8.4–10.5)
Chloride: 106 meq/L (ref 96–112)
Creatinine, Ser: 1.28 mg/dL (ref 0.40–1.50)
GFR: 55.81 mL/min — ABNORMAL LOW (ref >60.00)
Glucose, Bld: 111 mg/dL — ABNORMAL HIGH (ref 70–99)
Potassium: 4.1 meq/L (ref 3.5–5.1)
Sodium: 142 meq/L (ref 135–145)
Total Bilirubin: 0.5 mg/dL (ref 0.2–1.2)
Total Protein: 6.6 g/dL (ref 6.0–8.3)

## 2023-06-16 LAB — LIPID PANEL
Cholesterol: 180 mg/dL (ref 0–200)
HDL: 40.3 mg/dL (ref >39.00)
LDL Cholesterol: 109 mg/dL — ABNORMAL HIGH (ref 0–99)
NonHDL: 139.35
Total CHOL/HDL Ratio: 4
Triglycerides: 153 mg/dL — ABNORMAL HIGH (ref 0.0–149.0)
VLDL: 30.6 mg/dL (ref 0.0–40.0)

## 2023-06-16 LAB — CBC
HCT: 45.7 % (ref 39.0–52.0)
Hemoglobin: 14.8 g/dL (ref 13.0–17.0)
MCHC: 32.3 g/dL (ref 30.0–36.0)
MCV: 83.8 fl (ref 78.0–100.0)
Platelets: 262 10*3/uL (ref 150.0–400.0)
RBC: 5.46 Mil/uL (ref 4.22–5.81)
RDW: 15.3 % (ref 11.5–15.5)
WBC: 5.6 10*3/uL (ref 4.0–10.5)

## 2023-06-16 LAB — TSH: TSH: 8.05 u[IU]/mL — ABNORMAL HIGH (ref 0.35–5.50)

## 2023-06-16 LAB — PSA: PSA: 1.58 ng/mL (ref 0.10–4.00)

## 2023-06-16 LAB — HEMOGLOBIN A1C: Hgb A1c MFr Bld: 6.2 % (ref 4.6–6.5)

## 2023-06-16 MED ORDER — FUROSEMIDE 40 MG PO TABS: 40.0000 mg | ORAL_TABLET | Freq: Every day | ORAL | 3 refills | Status: DC | PRN

## 2023-06-16 NOTE — Assessment & Plan Note (Signed)
 Check A1c.

## 2023-06-16 NOTE — Assessment & Plan Note (Signed)
 His symptoms are still not fully controlled.  Likely due to venous insufficiency though we will check labs today to rule out other causes.  Also has an appointment with cardiology in a few weeks though overall suspicion for cardiac etiology at this point is low in the differential.  He was on Lasix  previously and overall did well however this caused excessive urination.  Will restart today 40 mg daily as needed.  Advised him to increase his potassium intake and decrease sodium intake.  We are checking renal function today.  He will follow-up with me in 1 to 2 weeks.

## 2023-06-16 NOTE — Progress Notes (Signed)
   Dale Wood is a 73 y.o. male who presents today for an office visit.  Assessment/Plan:  Chronic Problems Addressed Today: Leg edema His symptoms are still not fully controlled.  Likely due to venous insufficiency though we will check labs today to rule out other causes.  Also has an appointment with cardiology in a few weeks though overall suspicion for cardiac etiology at this point is low in the differential.  He was on Lasix  previously and overall did well however this caused excessive urination.  Will restart today 40 mg daily as needed.  Advised him to increase his potassium intake and decrease sodium intake.  We are checking renal function today.  He will follow-up with me in 1 to 2 weeks.  Hyperglycemia Check A1c.   Hyperlipidemia Check lipids.  Subclinical hypothyroidism Check TSH.  Hypertension Elevated today.  His blood pressure has typically been well-controlled at his most recent visits at orthopedics as well as here.  Unclear etiology for his elevation today.  We are checking labs today as above.  No signs of endorgan damage.  He has been consistent with losartan  100 mg daily.  He will continue to monitor at home and follow-up with us  early next week.  As above we also did discuss importance of lowering his sodium intake if blood pressure is still elevated would consider restarting HCTZ.  We discussed reasons to return to care and seek emergent care.     Subjective:  HPI:  See Assessment / plan for status of chronic conditions.  He is here today with primary concern for leg edema. This has been going on for years and he was on lasix  previously however he had to discontinue due to excessive urination. Swelling has worsened recently but does come and go. He did call to schedule an appointment with cardiology but will not be able to see them for a few weeks.  He is not having any reported chest pain or shortness of breath.  No reported fevers or chills.  He is on his feet  quite a bit during the day due to his part-time work as a Conservation officer, nature.  He has not been monitoring his sodium intake though does admit to eating quite a bit of high sodium foods including crackers.       Objective:  Physical Exam: BP (!) 198/80   Pulse 61   Temp 97.7 F (36.5 C) (Temporal)   Ht 5\' 11"  (1.803 m)   Wt 246 lb 3.2 oz (111.7 kg)   SpO2 96%   BMI 34.34 kg/m   Gen: No acute distress, resting comfortably CV: Regular rate and rhythm with no murmurs appreciated Pulm: Normal work of breathing, clear to auscultation bilaterally with no crackles, wheezes, or rhonchi MUSCULOSKELETAL: Legs with 3+ pitting edema bilaterally.  Several scattered varicosities noted as well.  Good distal cap refill. Neuro: Grossly normal, moves all extremities Psych: Normal affect and thought content      Adriella Essex M. Daneil Dunker, MD 06/16/2023 8:57 AM

## 2023-06-16 NOTE — Assessment & Plan Note (Signed)
 Check TSH

## 2023-06-16 NOTE — Patient Instructions (Signed)
 It was very nice to see you today!  We will check blood work today.  Believe the swelling in your legs is probably due to venous insufficiency.  Please watch your sodium intake.  You can try using compression stockings.  Try to keep your legs elevated above the level of your heart when possible.  We will restart your Lasix .  You can use this once per day as needed for swelling in your legs.  Please make sure that you are getting plenty of potassium in your diet when you are taking this.  Please follow-up with me next week to let me know how you are doing.  Please monitor your blood pressure at home and follow-up with us  in 1 to 2 weeks.  Return in about 1 week (around 06/23/2023).   Take care, Dr Daneil Dunker  PLEASE NOTE:  If you had any lab tests, please let us  know if you have not heard back within a few days. You may see your results on mychart before we have a chance to review them but we will give you a call once they are reviewed by us .   If we ordered any referrals today, please let us  know if you have not heard from their office within the next week.   If you had any urgent prescriptions sent in today, please check with the pharmacy within an hour of our visit to make sure the prescription was transmitted appropriately.   Please try these tips to maintain a healthy lifestyle:  Eat at least 3 REAL meals and 1-2 snacks per day.  Aim for no more than 5 hours between eating.  If you eat breakfast, please do so within one hour of getting up.   Each meal should contain half fruits/vegetables, one quarter protein, and one quarter carbs (no bigger than a computer mouse)  Cut down on sweet beverages. This includes juice, soda, and sweet tea.   Drink at least 1 glass of water with each meal and aim for at least 8 glasses per day  Exercise at least 150 minutes every week.

## 2023-06-16 NOTE — Assessment & Plan Note (Signed)
 Check lipids

## 2023-06-16 NOTE — Assessment & Plan Note (Addendum)
 Elevated today.  His blood pressure has typically been well-controlled at his most recent visits at orthopedics as well as here.  Unclear etiology for his elevation today.  We are checking labs today as above.  No signs of endorgan damage.  He has been consistent with losartan  100 mg daily.  He will continue to monitor at home and follow-up with us  early next week.  As above we also did discuss importance of lowering his sodium intake if blood pressure is still elevated would consider restarting HCTZ.  We discussed reasons to return to care and seek emergent care.

## 2023-06-16 NOTE — Progress Notes (Signed)
 He has some related decline in his kidney function however overall this is stable compared to his previous values over the last few years.  It is very important we continue to work on blood pressure management for his kidneys.  He should monitor his blood pressure at home and let us  know if not improving over the next few days.  His thyroid  levels are off.  This is likely contributing significantly to the swelling in his legs.  Please see if we can add on a free T3 and free T4 to his most recent labs.  If not we may need to have him come back in to recheck.  His cholesterol is moderately elevated.  He may benefit from starting a cholesterol medication.  Please send in Lipitor if he is agreeable to start.  Regardless he should continue to work on diet and exercise and we can recheck in a year.  His A1c is borderline elevated.  Do not need to start meds for this but he should continue to work on diet and exercise and we can recheck again in a year or so.  His PSA is normal.  The rest of his labs are at goal.

## 2023-06-17 ENCOUNTER — Encounter: Payer: Self-pay | Admitting: Family Medicine

## 2023-06-19 ENCOUNTER — Other Ambulatory Visit: Payer: Self-pay | Admitting: *Deleted

## 2023-06-19 DIAGNOSIS — R7989 Other specified abnormal findings of blood chemistry: Secondary | ICD-10-CM

## 2023-06-19 NOTE — Telephone Encounter (Signed)
 We can discuss more at upcoming visit however if his blood pressure is still elevated at home, recommend we restart HCTZ 12.5 mg daily.  Please send a new prescription if needed.

## 2023-06-19 NOTE — Telephone Encounter (Signed)
 See note

## 2023-06-20 NOTE — Telephone Encounter (Signed)
 Spoke with patient, notified he is only taking Losartan  and Lasix  only  HZTC was a recommendation since BP was elevated but not prescribed  Verbalized understanding, will discuss with PCP on upcoming appt

## 2023-06-21 ENCOUNTER — Encounter: Payer: Self-pay | Admitting: Family Medicine

## 2023-06-21 ENCOUNTER — Ambulatory Visit (INDEPENDENT_AMBULATORY_CARE_PROVIDER_SITE_OTHER): Admitting: Family Medicine

## 2023-06-21 VITALS — BP 159/85 | HR 96 | Temp 97.2°F | Ht 71.0 in | Wt 241.0 lb

## 2023-06-21 DIAGNOSIS — I10 Essential (primary) hypertension: Secondary | ICD-10-CM

## 2023-06-21 DIAGNOSIS — R600 Localized edema: Secondary | ICD-10-CM | POA: Diagnosis not present

## 2023-06-21 DIAGNOSIS — E038 Other specified hypothyroidism: Secondary | ICD-10-CM

## 2023-06-21 LAB — T3, FREE: T3, Free: 3.1 pg/mL (ref 2.3–4.2)

## 2023-06-21 LAB — TSH: TSH: 7.65 u[IU]/mL — ABNORMAL HIGH (ref 0.35–5.50)

## 2023-06-21 LAB — BASIC METABOLIC PANEL WITH GFR
BUN: 21 mg/dL (ref 6–23)
CO2: 31 meq/L (ref 19–32)
Calcium: 9.4 mg/dL (ref 8.4–10.5)
Chloride: 103 meq/L (ref 96–112)
Creatinine, Ser: 1.38 mg/dL (ref 0.40–1.50)
GFR: 50.99 mL/min — ABNORMAL LOW (ref >60.00)
Glucose, Bld: 111 mg/dL — ABNORMAL HIGH (ref 70–99)
Potassium: 4.1 meq/L (ref 3.5–5.1)
Sodium: 140 meq/L (ref 135–145)

## 2023-06-21 LAB — T4, FREE: Free T4: 0.67 ng/dL (ref 0.60–1.60)

## 2023-06-21 NOTE — Progress Notes (Signed)
   Dale Wood is a 73 y.o. male who presents today for an office visit.  Assessment/Plan:  Chronic Problems Addressed Today: Leg edema Dale Wood has had good response with Lasix  and is experiencing brisk diuresis with this.  Will recheck labs today.  Dale Wood is also working on lifestyle interventions including leg elevation and low-sodium diet.  Dale Wood can continue with the Lasix  40 mg daily as needed for the next week or so, though we are also addressing his hypothyroidism as below which is likely contributing to his leg edema as well.  Subclinical hypothyroidism Last TSH elevated.  Will recheck today as well as his free T3 and T4.  Given his lower extremity edema and other clinical signs we will likely start low-dose Synthroid  25 mcg daily depending on results of today's labs.  Hypertension Elevated today though much better than last week.  Dale Wood is working on salt reduction and lifestyle interventions as well.  Dale Wood will continue losartan  100 mg daily.  Will recheck BMP today as we just recently started Lasix .  Dale Wood is working on increasing potassium in his diet as well.  Dale Wood will continue to monitor at home and follow-up with us  in a couple of weeks.  If blood pressure does not normalize would consider restarting HCTZ at that time.     Subjective:  HPI:  See A/P for status of chronic conditions.  Patient is here today for follow-up.  I saw Dale Wood last week with leg edema and hypertension.  We started Dale Wood on lasix . Dale Wood started this a couple of days ago. Dale Wood has noticed brisk diuresis with this.  Dale Wood has additionally been working on low-sodium diet and being more active at home.  Dale Wood has been trying to increase his potassium intake at home.  His blood pressures at home have been improving.  Typically ranging in the 140s to 160's over 80s.  We did check labs at his most recent visit here last week which indicated elevated TSH of 8.02.        Objective:  Physical Exam: BP (!) 159/85   Pulse 96   Temp (!) 97.2 F  (36.2 C) (Temporal)   Ht 5\' 11"  (1.803 m)   Wt 241 lb (109.3 kg)   SpO2 96%   BMI 33.61 kg/m   Wt Readings from Last 3 Encounters:  06/21/23 241 lb (109.3 kg)  06/16/23 246 lb 3.2 oz (111.7 kg)  09/27/22 243 lb (110.2 kg)    Gen: No acute distress, resting comfortably CV: Regular rate and rhythm with no murmurs appreciated Pulm: Normal work of breathing, clear to auscultation bilaterally with no crackles, wheezes, or rhonchi MUSCULOSKELETAL: Bilateral legs with 3+ pitting edema up to the midshin bilaterally.  Several scattered varicosities noted.  Improved from previous exam. Neuro: Grossly normal, moves all extremities Psych: Normal affect and thought content      Meiah Zamudio M. Daneil Dunker, MD 06/21/2023 8:41 AM

## 2023-06-21 NOTE — Assessment & Plan Note (Signed)
 Last TSH elevated.  Will recheck today as well as his free T3 and T4.  Given his lower extremity edema and other clinical signs we will likely start low-dose Synthroid  25 mcg daily depending on results of today's labs.

## 2023-06-21 NOTE — Assessment & Plan Note (Signed)
 Elevated today though much better than last week.  He is working on salt reduction and lifestyle interventions as well.  He will continue losartan  100 mg daily.  Will recheck BMP today as we just recently started Lasix .  He is working on increasing potassium in his diet as well.  He will continue to monitor at home and follow-up with us  in a couple of weeks.  If blood pressure does not normalize would consider restarting HCTZ at that time.

## 2023-06-21 NOTE — Assessment & Plan Note (Signed)
 He has had good response with Lasix  and is experiencing brisk diuresis with this.  Will recheck labs today.  He is also working on lifestyle interventions including leg elevation and low-sodium diet.  He can continue with the Lasix  40 mg daily as needed for the next week or so, though we are also addressing his hypothyroidism as below which is likely contributing to his leg edema as well.

## 2023-06-21 NOTE — Patient Instructions (Signed)
 It was very nice to see you today!  I am glad that you are swelling and blood pressure are improving.  Will continue your current medications.  Will recheck your thyroid  levels today.  Return in about 1 month (around 07/21/2023).   Take care, Dr Daneil Dunker  PLEASE NOTE:  If you had any lab tests, please let us  know if you have not heard back within a few days. You may see your results on mychart before we have a chance to review them but we will give you a call once they are reviewed by us .   If we ordered any referrals today, please let us  know if you have not heard from their office within the next week.   If you had any urgent prescriptions sent in today, please check with the pharmacy within an hour of our visit to make sure the prescription was transmitted appropriately.   Please try these tips to maintain a healthy lifestyle:  Eat at least 3 REAL meals and 1-2 snacks per day.  Aim for no more than 5 hours between eating.  If you eat breakfast, please do so within one hour of getting up.   Each meal should contain half fruits/vegetables, one quarter protein, and one quarter carbs (no bigger than a computer mouse)  Cut down on sweet beverages. This includes juice, soda, and sweet tea.   Drink at least 1 glass of water with each meal and aim for at least 8 glasses per day  Exercise at least 150 minutes every week.

## 2023-06-23 ENCOUNTER — Encounter: Payer: Self-pay | Admitting: Family Medicine

## 2023-06-23 ENCOUNTER — Ambulatory Visit: Payer: Self-pay | Admitting: Family Medicine

## 2023-06-23 NOTE — Telephone Encounter (Signed)
 Please see result note.   Okay to send in Synthroid  25 mcg daily and have him come back in 6 months to recheck.

## 2023-06-23 NOTE — Telephone Encounter (Signed)
**Note De-identified  [place] Obfuscation** Please advise 

## 2023-06-23 NOTE — Progress Notes (Signed)
 Thyroid  level is still off.  As we discussed at his office visit recommend starting Synthroid  25 mg daily.  Please send a new prescription.  I would like to see him back here in 6 weeks.  We can check labs at his next office visit.

## 2023-06-26 ENCOUNTER — Other Ambulatory Visit: Payer: Self-pay | Admitting: *Deleted

## 2023-06-26 DIAGNOSIS — M47816 Spondylosis without myelopathy or radiculopathy, lumbar region: Secondary | ICD-10-CM | POA: Diagnosis not present

## 2023-06-26 MED ORDER — LEVOTHYROXINE SODIUM 25 MCG PO TABS: 25.0000 ug | ORAL_TABLET | Freq: Every day | ORAL | 1 refills | Status: DC

## 2023-06-26 NOTE — Telephone Encounter (Signed)
 Rx Synthroid  25mcg send to pharmacy

## 2023-06-26 NOTE — Telephone Encounter (Signed)
 Rx Synthroid  25 mg daily send to patient pharmacy

## 2023-07-03 ENCOUNTER — Ambulatory Visit: Attending: Cardiology | Admitting: Cardiology

## 2023-07-03 ENCOUNTER — Encounter: Payer: Self-pay | Admitting: Cardiology

## 2023-07-03 VITALS — BP 193/73 | HR 61 | Ht 71.0 in | Wt 244.2 lb

## 2023-07-03 DIAGNOSIS — M7989 Other specified soft tissue disorders: Secondary | ICD-10-CM

## 2023-07-03 DIAGNOSIS — I10 Essential (primary) hypertension: Secondary | ICD-10-CM | POA: Diagnosis not present

## 2023-07-03 NOTE — Patient Instructions (Signed)
 Medication Instructions:  Continue same medications  Lab Work: None ordered  Testing/Procedures: Echo  first available  Follow-Up: At [facility], you and your health needs are our priority.  As part of our continuing mission to provide you with exceptional heart care, our providers are all part of one team.  This team includes your primary Cardiologist (physician) and Advanced Practice Providers or APPs (Physician Assistants and Nurse Practitioners) who all work together to provide you with the care you need, when you need it.  Your next appointment:  To Be Determined after test    Provider:  Dr.Jordan    We recommend signing up for the patient portal called "MyChart".  Sign up information is provided on this After Visit Summary.  MyChart is used to connect with patients for Virtual Visits (Telemedicine).  Patients are able to view lab/test results, encounter notes, upcoming appointments, etc.  Non-urgent messages can be sent to your provider as well.   To learn more about what you can do with MyChart, go to [email or web address].

## 2023-07-03 NOTE — Progress Notes (Signed)
 Cardiology Office Note:    Date:  07/03/2023   ID:  Dale Wood, DOB April 10, 1950, MRN [id number]  PCP:  Rodney Clamp, MD   [facility] Providers Cardiologist:  None     Referring MD: Rodney Clamp, MD   Chief Complaint  Patient presents with   Hypertension   Edema    History of Present Illness:    Dale Wood is a 73 y.o. male is seen at the request of Dr Daneil Dunker for evaluation of edema.  He has a history of HTN, HLD. Also history of rheumatic fever but Echo in 2020 without significant valve pathology. In 2020 had calcium  score of 0. Had ETT in early 2020 which was negative but did not achieve target HR.   He reports that since December he has had more LE edema. Started after knee surgery in November. Notes he needs TKR on right. No dyspnea, chest pain. Is on lasix  now. Swelling does not go down at night. He has HTN. BP has been higher recently but good control last year.   Past Medical History:  Diagnosis Date   Abdominal pain, epigastric 10/27/2008   Qualifier: Diagnosis of  By: Rochelle Chu MD, Randene Bustard INJURY, RIGHT 09/22/2009   Qualifier: Diagnosis of  By: Dufm Gibbon, CMA, [name]     Basal cell carcinoma 10/29/2012   left forearm tx cx3 46fu   BASAL CELL CARCINOMA, FACE 10/27/2008   Qualifier: Diagnosis of  By: Rochelle Chu MD, Randa Burton.    BASAL CELL CARCINOMA, FACE 10/27/2008   Qualifier: Diagnosis of By: Rochelle Chu MD, Randa Burton.    Cancer [facility])    Skin Cancer on Nose   CELLULITIS, FOOT 09/22/2009   Qualifier: Diagnosis of  By: Rochelle Chu MD, Randa Burton.    Constipation 01/05/2017   DYSMETABOLIC SYNDROME 02/05/2009   Qualifier: Diagnosis of  By: Rochelle Chu MD, Randa Burton.    DYSPEPSIA 10/27/2008   Qualifier: Diagnosis of  By: Rochelle Chu MD, Randa Burton.    ERECTILE DYSFUNCTION, ORGANIC 10/27/2008   Qualifier: Diagnosis of  By: Rochelle Chu MD, Randa Burton.    Headache 07/30/2015   Heart murmur    History of kidney stones    about 30 passed   Hyperlipidemia    HYPERLIPIDEMIA 10/27/2008    Qualifier: Diagnosis of  By: Rochelle Chu MD, Randa Burton.    Hypertension    HYPOGONADISM 11/26/2008   Qualifier: Diagnosis of  By: Rochelle Chu MD, Randa Burton.    Hypothyroidism    Kidney stones    Low testosterone    NEPHROLITHIASIS, HX OF 10/27/2008   Qualifier: Diagnosis of  By: Rochelle Chu MD, Randa Burton.    Nephrolithiasis, uric acid    OSTEOARTHRITIS 10/27/2008   Qualifier: Diagnosis of  By: Rochelle Chu MD, Randa Burton.    Positive TB test    Postherpetic neuralgia 03/27/2017   Rheumatic fever    Rheumatic fever    SKIN CANCER, HX OF 10/27/2008   Qualifier: Diagnosis of  By: Rochelle Chu MD, Randa Burton.    Squamous cell carcinoma of skin 11/27/2013   left shin tx with bx curet and 5 fu   Tachycardia 11/09/2017   Thyroid  disease    Tuberculosis 1986 ish   treated   Unspecified disorder of kidney and ureter    URI 02/05/2009   Qualifier: Diagnosis of  By: Rochelle Chu MD, Randa Burton.     Past Surgical History:  Procedure Laterality Date   CHOLECYSTECTOMY N/A 03/18/2020   Procedure: LAPAROSCOPIC CHOLECYSTECTOMY;  Surgeon: Oza Blumenthal, MD;  Location: [facility] OR;  Service: General;  Laterality: N/A;  60 MIN TOTAL - ROOM 10   KNEE SURGERY Right    2005, 2008. Scope for MCL tear   TONSILLECTOMY AND ADENOIDECTOMY     UMBILICAL HERNIA REPAIR N/A 03/18/2020   Procedure: HERNIA REPAIR UMBILICAL ADULT;  Surgeon: Oza Blumenthal, MD;  Location: [facility] OR;  Service: General;  Laterality: N/A;    Current Medications: Current Meds  Medication Sig   furosemide  (LASIX ) 40 MG tablet Take 1 tablet (40 mg total) by mouth daily as needed.   levothyroxine  (SYNTHROID ) 25 MCG tablet Take 1 tablet (25 mcg total) by mouth daily.   losartan  (COZAAR ) 100 MG tablet TAKE 1 TABLET(100 MG) BY MOUTH DAILY   methocarbamol (ROBAXIN) 500 MG tablet Take 500-1,000 mg by mouth every 6 (six) hours as needed.   Multiple Vitamins-Minerals (ONE-A-DAY MENS 50+) TABS Take 1 tablet by mouth daily.     Allergies:   Androgel [testosterone] and Penicillins    Social History   Socioeconomic History   Marital status: Married    Spouse name: Not on file   Number of children: Not on file   Years of education: Not on file   Highest education level: Not on file  Occupational History   Not on file  Tobacco Use   Smoking status: Former    Types: Cigars, Pipe    Quit date: 2001    Years since quitting: 24.4   Smokeless tobacco: Never  Vaping Use   Vaping status: Never Used  Substance and Sexual Activity   Alcohol use: Yes    Comment: 1-2 times a year   Drug use: No   Sexual activity: Yes  Other Topics Concern   Not on file  Social History Narrative   Not on file   Social Drivers of Health   Financial Resource Strain: Low Risk  (09/27/2022)   Overall Financial Resource Strain (CARDIA)    Difficulty of Paying Living Expenses: Not hard at all  Food Insecurity: No Food Insecurity (09/27/2022)   Hunger Vital Sign    Worried About Running Out of Food in the Last Year: Never true    Ran Out of Food in the Last Year: Never true  Transportation Needs: No Transportation Needs (09/27/2022)   PRAPARE - Administrator, Civil Service (Medical): No    Lack of Transportation (Non-Medical): No  Physical Activity: Sufficiently Active (09/27/2022)   Exercise Vital Sign    Days of Exercise per Week: 3 days    Minutes of Exercise per Session: 60 min  Stress: No Stress Concern Present (09/27/2022)   [employer] of Occupational Health - Occupational Stress Questionnaire    Feeling of Stress : Not at all  Social Connections: Socially Integrated (09/27/2022)   Social Connection and Isolation Panel    Frequency of Communication with Friends and Family: Once a week    Frequency of Social Gatherings with Friends and Family: More than three times a week    Attends Religious Services: More than 4 times per year    Active Member of [employer] or Organizations: Yes    Attends Banker Meetings: 1 to 4 times per year    Marital Status:  Married     Family History: The patient's family history includes Birth defects in his sister; Cancer in his brother, father, and mother; Diabetes in his brother, maternal grandmother, and mother; Drug abuse in his brother; Early death in his  father; Obesity in his son; Stroke in his maternal grandmother; Thyroid  disease in his maternal grandmother; Tremor in his maternal grandfather.  ROS:   Please see the history of present illness.     All other systems reviewed and are negative.  EKGs/Labs/Other Studies Reviewed:    The following studies were reviewed today:  EKG Interpretation Date/Time:  Monday July 03 2023 15:45:49 EDT Ventricular Rate:  61 PR Interval:  158 QRS Duration:  102 QT Interval:  406 QTC Calculation: 408 R Axis:   70  Text Interpretation: Normal sinus rhythm Normal ECG When compared with ECG of 13-Jan-2022 18:46, Vent. rate has decreased BY  39 BPM QT has shortened Confirmed by [place], Sheril Hammond [phone]) on 07/03/2023 4:07:25 PM    Recent Labs: 06/16/2023: ALT 17; Hemoglobin 14.8; Platelets 262.0 06/21/2023: BUN 21; Creatinine, Ser 1.38; Potassium 4.1; Sodium 140; TSH 7.65  Recent Lipid Panel    Component Value Date/Time   CHOL 180 06/16/2023 0845   TRIG 153.0 (H) 06/16/2023 0845   HDL 40.30 06/16/2023 0845   CHOLHDL 4 06/16/2023 0845   VLDL 30.6 06/16/2023 0845   LDLCALC 109 (H) 06/16/2023 0845   LDLCALC  08/29/2019 1539     Comment:     . LDL cholesterol not calculated. Triglyceride levels greater than 400 mg/dL invalidate calculated LDL results. . Reference range: <100 . Desirable range <100 mg/dL for primary prevention;   <70 mg/dL for patients with CHD or diabetic patients  with > or = 2 CHD risk factors. Aaron Aas LDL-C is now calculated using the Martin-Hopkins  calculation, which is a validated novel method providing  better accuracy than the Friedewald equation in the  estimation of LDL-C.  Melinda Sprawls et al. Erroll Heard. 9562;130(86): 2061-2068   (http://education.[employer].com/faq/FAQ164)    LDLDIRECT 108.0 09/28/2021 1502     Risk Assessment/Calculations:        Physical Exam:    VS:  BP (!) 193/73 (BP Location: Left Arm, Patient Position: Sitting, Cuff Size: Large)   Pulse 61   Ht 5' 11 (1.803 m)   Wt 244 lb 3.2 oz (110.8 kg)   SpO2 98%   BMI 34.06 kg/m     Wt Readings from Last 3 Encounters:  07/03/23 244 lb 3.2 oz (110.8 kg)  06/21/23 241 lb (109.3 kg)  06/16/23 246 lb 3.2 oz (111.7 kg)     GEN:  Well nourished, well developed in no acute distress HEENT: Normal NECK: No JVD; No carotid bruits LYMPHATICS: No lymphadenopathy CARDIAC: RRR, no murmurs, rubs, gallops RESPIRATORY:  Clear to auscultation without rales, wheezing or rhonchi  ABDOMEN: Soft, non-tender, non-distended MUSCULOSKELETAL:  2+ bilateral LE edema below the knees; No deformity  SKIN: Warm and dry NEUROLOGIC:  Alert and oriented x 3 PSYCHIATRIC:  Normal affect   ASSESSMENT:    1. Hypertension, unspecified type   2. Localized swelling of both lower extremities    PLAN:    In order of problems listed above:  HTN. BP is quite high today but has been good this past year. Recommend keeping a BP diary. Low sodium diet. If BP remains high will need additional therapy.  LE edema. Suspect more related to venous insufficiency given multiple prior knee surgeries. Echo in 2020 unremarkable. BNP in 2023 normal. Will update Echo. If negative will treat conservatively with lasix , elevation, compression hose and sodium restriction. Protein stores look OK.            Medication Adjustments/Labs and Tests Ordered: Current medicines are reviewed at  length with the patient today.  Concerns regarding medicines are outlined above.  Orders Placed This Encounter  Procedures   EKG 12-Lead   ECHOCARDIOGRAM COMPLETE   No orders of the defined types were placed in this encounter.   Patient Instructions  Medication Instructions:  Continue  same medications  Lab Work: None ordered  Testing/Procedures: Echo  first available   Follow-Up: At [facility], you and your health needs are our priority.  As part of our continuing mission to provide you with exceptional heart care, our providers are all part of one team.  This team includes your primary Cardiologist (physician) and Advanced Practice Providers or APPs (Physician Assistants and Nurse Practitioners) who all work together to provide you with the care you need, when you need it.  Your next appointment:  To Be Determined after test    Provider:  Dr.Kacey Dysert   We recommend signing up for the patient portal called MyChart.  Sign up information is provided on this After Visit Summary.  MyChart is used to connect with patients for Virtual Visits (Telemedicine).  Patients are able to view lab/test results, encounter notes, upcoming appointments, etc.  Non-urgent messages can be sent to your provider as well.   To learn more about what you can do with MyChart, go to [email or web address].         Signed, Sari Cogan [place], MD  07/03/2023 5:01 PM    [facility]

## 2023-07-11 ENCOUNTER — Other Ambulatory Visit: Payer: Self-pay | Admitting: *Deleted

## 2023-07-11 MED ORDER — LOSARTAN POTASSIUM 100 MG PO TABS: ORAL_TABLET | ORAL | 1 refills | Status: DC

## 2023-07-25 ENCOUNTER — Ambulatory Visit: Admitting: Family Medicine

## 2023-08-03 ENCOUNTER — Ambulatory Visit (INDEPENDENT_AMBULATORY_CARE_PROVIDER_SITE_OTHER): Admitting: Family Medicine

## 2023-08-03 VITALS — BP 131/66 | HR 61 | Temp 97.5°F | Ht 71.0 in | Wt 244.4 lb

## 2023-08-03 DIAGNOSIS — M62838 Other muscle spasm: Secondary | ICD-10-CM

## 2023-08-03 DIAGNOSIS — E038 Other specified hypothyroidism: Secondary | ICD-10-CM | POA: Diagnosis not present

## 2023-08-03 DIAGNOSIS — I10 Essential (primary) hypertension: Secondary | ICD-10-CM

## 2023-08-03 NOTE — Patient Instructions (Addendum)
 It was very nice to see you today!  We will check blood work today.  Your blood pressure is at goal.  Please continue to work on diet and exercise.  Make sure that you are getting plenty of fluid and staying well-hydrated.  No medication changes today.  Return if symptoms worsen or fail to improve.   Take care, Dr Kennyth  PLEASE NOTE:  If you had any lab tests, please let us  know if you have not heard back within a few days. You may see your results on mychart before we have a chance to review them but we will give you a call once they are reviewed by us .   If we ordered any referrals today, please let us  know if you have not heard from their office within the next week.   If you had any urgent prescriptions sent in today, please check with the pharmacy within an hour of our visit to make sure the prescription was transmitted appropriately.   Please try these tips to maintain a healthy lifestyle:  Eat at least 3 REAL meals and 1-2 snacks per day.  Aim for no more than 5 hours between eating.  If you eat breakfast, please do so within one hour of getting up.   Each meal should contain half fruits/vegetables, one quarter protein, and one quarter carbs (no bigger than a computer mouse)  Cut down on sweet beverages. This includes juice, soda, and sweet tea.   Drink at least 1 glass of water with each meal and aim for at least 8 glasses per day  Exercise at least 150 minutes every week.

## 2023-08-03 NOTE — Assessment & Plan Note (Signed)
 Blood pressure at goal today on losartan  100 mg daily.  He does have some fluctuating blood pressure readings at home.  Hopefully this will improve as we are treating his subclinical hypothyroidism.  He is also working on a low sodium and high potassium diet.  He will continue to monitor for now.  If persistently elevated at home where she is elevated at next office visit would consider restarting HCTZ at some point the future.

## 2023-08-03 NOTE — Progress Notes (Addendum)
   Dale Wood is a 73 y.o. male who presents today for an office visit.  Assessment/Plan:  New/Acute Problems: Epigastric Discomfort No red flags.  Seems to be likely abdominal wall musculature spasm.  Reassuring exam today.  Will check electrolytes and B12.  We did discuss importance of staying well-hydrated.  His symptoms not currently bothersome.  He will let us  know if he has any change or worsening symptoms.  Chronic Problems Addressed Today: Hypertension Blood pressure at goal today on losartan  100 mg daily.  He does have some fluctuating blood pressure readings at home.  Hopefully this will improve as we are treating his subclinical hypothyroidism.  He is also working on a low sodium and high potassium diet.  He will continue to monitor for now.  If persistently elevated at home where she is elevated at next office visit would consider restarting HCTZ at some point the future.  Subclinical hypothyroidism Doing well Synthroid  25 mcg daily.  Will recheck labs today.     Subjective:  HPI:  See A/P for status of chronic conditions.  Patient is here today for follow-up.  I saw him 6 weeks ago for leg edema and hypertension.  We started Lasix  40 mg daily to help with his edema.  We also checked labs which were notable for elevated TSH and we started Synthroid  25 mcg daily.  He has been tolerating this well.  He does feel like the leg edema has improved modestly.  He only takes Lasix  on days that he is staying home.  He did see his cardiologist since our last visit and they are planning on performing echocardiogram in a couple weeks.  He has no acute concerns today though he has noticed a spasm in his upper abdomen and lower chest over the last few weeks.  This happens randomly.  No obvious causes.  He notices it because he can see his shirt fluttering.  Does not have any specific pain.  No specific triggers noted.       Objective:  Physical Exam: BP 131/66   Pulse 61   Temp (!)  97.5 F (36.4 C) (Temporal)   Ht 5' 11 (1.803 m)   Wt 244 lb 6.4 oz (110.9 kg)   SpO2 96%   BMI 34.09 kg/m   Wt Readings from Last 3 Encounters:  08/03/23 244 lb 6.4 oz (110.9 kg)  07/03/23 244 lb 3.2 oz (110.8 kg)  06/21/23 241 lb (109.3 kg)    Gen: No acute distress, resting comfortably CV: Regular rate and rhythm with no murmurs appreciated Pulm: Normal work of breathing, clear to auscultation bilaterally with no crackles, wheezes, or rhonchi MUSCULOSKELETAL: - Legs: 2+ edema to knees bilaterally.  Scattered varicosities noted. Neuro: Grossly normal, moves all extremities Psych: Normal affect and thought content      Stefanny Pieri M. Kennyth, MD 08/03/2023 2:55 PM

## 2023-08-03 NOTE — Assessment & Plan Note (Signed)
 Doing well Synthroid  25 mcg daily.  Will recheck labs today.

## 2023-08-04 LAB — TSH: TSH: 4.76 u[IU]/mL (ref 0.35–5.50)

## 2023-08-04 LAB — COMPREHENSIVE METABOLIC PANEL WITH GFR
ALT: 16 U/L (ref 0–53)
AST: 15 U/L (ref 0–37)
Albumin: 4.1 g/dL (ref 3.5–5.2)
Alkaline Phosphatase: 88 U/L (ref 39–117)
BUN: 24 mg/dL — ABNORMAL HIGH (ref 6–23)
CO2: 30 meq/L (ref 19–32)
Calcium: 9.3 mg/dL (ref 8.4–10.5)
Chloride: 106 meq/L (ref 96–112)
Creatinine, Ser: 1.35 mg/dL (ref 0.40–1.50)
GFR: 52.31 mL/min — ABNORMAL LOW (ref >60.00)
Glucose, Bld: 119 mg/dL — ABNORMAL HIGH (ref 70–99)
Potassium: 3.9 meq/L (ref 3.5–5.1)
Sodium: 144 meq/L (ref 135–145)
Total Bilirubin: 0.4 mg/dL (ref 0.2–1.2)
Total Protein: 6.6 g/dL (ref 6.0–8.3)

## 2023-08-04 LAB — VITAMIN B12: Vitamin B-12: 349 pg/mL (ref 211–911)

## 2023-08-04 LAB — CBC
HCT: 41.1 % (ref 39.0–52.0)
Hemoglobin: 13.4 g/dL (ref 13.0–17.0)
MCHC: 32.6 g/dL (ref 30.0–36.0)
MCV: 85.2 fl (ref 78.0–100.0)
Platelets: 344 K/uL (ref 150.0–400.0)
RBC: 4.83 Mil/uL (ref 4.22–5.81)
RDW: 16.3 % — ABNORMAL HIGH (ref 11.5–15.5)
WBC: 12.2 K/uL — ABNORMAL HIGH (ref 4.0–10.5)

## 2023-08-04 LAB — MAGNESIUM: Magnesium: 2.4 mg/dL (ref 1.5–2.5)

## 2023-08-07 ENCOUNTER — Ambulatory Visit: Payer: Self-pay | Admitting: Family Medicine

## 2023-08-07 DIAGNOSIS — D729 Disorder of white blood cells, unspecified: Secondary | ICD-10-CM

## 2023-08-07 NOTE — Progress Notes (Signed)
 His white blood cell count was elevated.  Although his other labs are at goal.  Elevated white blood cell count may indicate a resolving infection or inflammation.  Recommend he come back in 3 to 4 weeks to recheck CBC with differential.  His thyroid  level is at goal-he can continue his current dose of thyroid  medication.  We should recheck everything in 6 to 12 months.

## 2023-08-09 ENCOUNTER — Ambulatory Visit: Admitting: Cardiology

## 2023-08-15 ENCOUNTER — Ambulatory Visit (HOSPITAL_COMMUNITY)
Admission: RE | Admit: 2023-08-15 | Discharge: 2023-08-15 | Disposition: A | Discharge status: Home / Self Care | Source: Ambulatory Visit | Attending: Internal Medicine | Admitting: Internal Medicine

## 2023-08-15 DIAGNOSIS — I10 Essential (primary) hypertension: Secondary | ICD-10-CM | POA: Diagnosis not present

## 2023-08-15 DIAGNOSIS — M7989 Other specified soft tissue disorders: Secondary | ICD-10-CM | POA: Diagnosis not present

## 2023-08-15 LAB — ECHOCARDIOGRAM COMPLETE
Area-P 1/2: 2.99 cm2
S' Lateral: 2.1 cm

## 2023-08-16 ENCOUNTER — Ambulatory Visit: Payer: Self-pay | Admitting: Cardiology

## 2023-08-24 ENCOUNTER — Encounter: Payer: Self-pay | Admitting: Physician Assistant

## 2023-08-24 ENCOUNTER — Ambulatory Visit (INDEPENDENT_AMBULATORY_CARE_PROVIDER_SITE_OTHER): Admitting: Physician Assistant

## 2023-08-24 VITALS — BP 144/80 | HR 57 | Temp 97.5°F | Ht 71.0 in | Wt 241.0 lb

## 2023-08-24 DIAGNOSIS — M7989 Other specified soft tissue disorders: Secondary | ICD-10-CM | POA: Diagnosis not present

## 2023-08-24 DIAGNOSIS — R051 Acute cough: Secondary | ICD-10-CM | POA: Diagnosis not present

## 2023-08-24 MED ORDER — AZITHROMYCIN 250 MG PO TABS: ORAL_TABLET | ORAL | 0 refills | Status: AC

## 2023-08-24 NOTE — Progress Notes (Signed)
 Dale Wood is a 73 y.o. male here for a new problem.  History of Present Illness:   Chief Complaint  Patient presents with   Cough    Pt c/o cough x 3 weeks, expectorating white sputum, also having clear nasal drainage. Cough worse at night. Has been using Delsym cough syrup, Nyquil.    Cough    Discussed the use of AI scribe software for clinical note transcription with the patient, who gave verbal consent to proceed.  History of Present Illness Dale Wood is a 73 year old male who presents with a persistent cough and mucus production for three weeks.  He experiences a persistent cough with mucus production for three weeks, with difficulty swallowing mucus and frequent expectoration. There is no fever, chills, or acute illness. He uses Delsym and generic Nyquil for symptom management. There is no shortness of breath, ear pain, crackling, popping, or severe sore throat. Nasal congestion and frequent nose blowing due to a constant drip lead to coughing up foamy mucus.  He has a history of pneumonia but notes current symptoms differ, lacking chest compression or fatigue. Appetite remains good with stable weight between 235 and 240 pounds.  He notes new leg swelling and has seen a cardiologist, with an ultrasound performed. He owns compression socks but finds them uncomfortable. Blood pressure has ranged from 150s/80s to 180s/80s in the past month. Currently taking lasix  40 mg daily.  He is undergoing treatment for back injuries and has an upcoming right knee replacement.    Past Medical History:  Diagnosis Date   Abdominal pain, epigastric 10/27/2008   Qualifier: Diagnosis of  By: Joshua MD, Debby FREDRIK MO INJURY, RIGHT 09/22/2009   Qualifier: Diagnosis of  By: Vicenta, CMA, [name]     Basal cell carcinoma 10/29/2012   left forearm tx cx3 79fu   BASAL CELL CARCINOMA, FACE 10/27/2008   Qualifier: Diagnosis of  By: Joshua MD, Debby CROME.    BASAL CELL CARCINOMA, FACE  10/27/2008   Qualifier: Diagnosis of By: Joshua MD, Debby CROME.    Cancer [facility])    Skin Cancer on Nose   CELLULITIS, FOOT 09/22/2009   Qualifier: Diagnosis of  By: Joshua MD, Debby CROME.    Constipation 01/05/2017   DYSMETABOLIC SYNDROME 02/05/2009   Qualifier: Diagnosis of  By: Joshua MD, Debby CROME.    DYSPEPSIA 10/27/2008   Qualifier: Diagnosis of  By: Joshua MD, Debby CROME.    ERECTILE DYSFUNCTION, ORGANIC 10/27/2008   Qualifier: Diagnosis of  By: Joshua MD, Debby CROME.    Headache 07/30/2015   Heart murmur    History of kidney stones    about 30 passed   Hyperlipidemia    HYPERLIPIDEMIA 10/27/2008   Qualifier: Diagnosis of  By: Joshua MD, Debby CROME.    Hypertension    HYPOGONADISM 11/26/2008   Qualifier: Diagnosis of  By: Joshua MD, Debby CROME.    Hypothyroidism    Kidney stones    Low testosterone    NEPHROLITHIASIS, HX OF 10/27/2008   Qualifier: Diagnosis of  By: Joshua MD, Debby CROME.    Nephrolithiasis, uric acid    OSTEOARTHRITIS 10/27/2008   Qualifier: Diagnosis of  By: Joshua MD, Debby CROME.    Positive TB test    Postherpetic neuralgia 03/27/2017   Rheumatic fever    Rheumatic fever    SKIN CANCER, HX OF 10/27/2008   Qualifier: Diagnosis of  By: Joshua MD, Debby CROME.    Squamous cell carcinoma  of skin 11/27/2013   left shin tx with bx curet and 5 fu   Tachycardia 11/09/2017   Thyroid  disease    Tuberculosis 1986 ish   treated   Unspecified disorder of kidney and ureter    URI 02/05/2009   Qualifier: Diagnosis of  By: Joshua MD, Debby CROME.      Social History   Tobacco Use   Smoking status: Former    Types: Cigars, Pipe    Quit date: 2001    Years since quitting: 24.6   Smokeless tobacco: Never  Vaping Use   Vaping status: Never Used  Substance Use Topics   Alcohol use: Yes    Comment: 1-2 times a year   Drug use: No    Past Surgical History:  Procedure Laterality Date   CHOLECYSTECTOMY N/A 03/18/2020   Procedure: LAPAROSCOPIC CHOLECYSTECTOMY;  Surgeon: Vernetta Berg, MD;   Location: [facility] OR;  Service: General;  Laterality: N/A;  60 MIN TOTAL - ROOM 10   KNEE SURGERY Right    2005, 2008. Scope for MCL tear   TONSILLECTOMY AND ADENOIDECTOMY     UMBILICAL HERNIA REPAIR N/A 03/18/2020   Procedure: HERNIA REPAIR UMBILICAL ADULT;  Surgeon: Vernetta Berg, MD;  Location: [facility] OR;  Service: General;  Laterality: N/A;    Family History  Problem Relation Age of Onset   Cancer Mother    Diabetes Mother    Cancer Father    Early death Father    Diabetes Brother    Cancer Brother        prostate   Drug abuse Brother    Birth defects Sister    Diabetes Maternal Grandmother    Thyroid  disease Maternal Grandmother    Stroke Maternal Grandmother    Tremor Maternal Grandfather    Obesity Son     Allergies  Allergen Reactions   Androgel [Testosterone]     *Pains in Chest/Axillary area*   Penicillins     *Does Not Work*    Current Medications:   Current Outpatient Medications:    azithromycin  (ZITHROMAX ) 250 MG tablet, Take 2 tablets on day 1, then 1 tablet daily on days 2 through 5, Disp: 6 tablet, Rfl: 0   furosemide  (LASIX ) 40 MG tablet, Take 1 tablet (40 mg total) by mouth daily as needed., Disp: 30 tablet, Rfl: 3   levothyroxine  (SYNTHROID ) 25 MCG tablet, Take 1 tablet (25 mcg total) by mouth daily., Disp: 90 tablet, Rfl: 1   losartan  (COZAAR ) 100 MG tablet, TAKE 1 TABLET(100 MG) BY MOUTH DAILY, Disp: 90 tablet, Rfl: 1   meloxicam  (MOBIC ) 15 MG tablet, , Disp: , Rfl:    methocarbamol (ROBAXIN) 500 MG tablet, Take 500-1,000 mg by mouth every 6 (six) hours as needed., Disp: , Rfl:    Multiple Vitamins-Minerals (ONE-A-DAY MENS 50+) TABS, Take 1 tablet by mouth daily., Disp: , Rfl:    Review of Systems:   Negative unless otherwise specified per HPI.  Vitals:   Vitals:   08/24/23 0800 08/24/23 0820  BP: (!) 150/80 (!) 144/80  Pulse: (!) 57   Temp: (!) 97.5 F (36.4 C)   TempSrc: Temporal   SpO2: 97%   Weight: 241 lb (109.3 kg)   Height: 5' 11  (1.803 m)      Body mass index is 33.61 kg/m.  Physical Exam:   Physical Exam Vitals and nursing note reviewed.  Constitutional:      General: He is not in acute distress.    Appearance: He is  well-developed. He is not ill-appearing or toxic-appearing.  HENT:     Head: Normocephalic and atraumatic.     Right Ear: Tympanic membrane, ear canal and external ear normal. Tympanic membrane is not erythematous, retracted or bulging.     Left Ear: Tympanic membrane, ear canal and external ear normal. Tympanic membrane is not erythematous, retracted or bulging.     Nose:     Right Sinus: Frontal sinus tenderness present. No maxillary sinus tenderness.     Left Sinus: Frontal sinus tenderness present. No maxillary sinus tenderness.     Mouth/Throat:     Pharynx: Uvula midline. No posterior oropharyngeal erythema.  Eyes:     General: Lids are normal.     Conjunctiva/sclera: Conjunctivae normal.  Neck:     Trachea: Trachea normal.  Cardiovascular:     Rate and Rhythm: Normal rate and regular rhythm.     Heart sounds: Normal heart sounds, S1 normal and S2 normal.  Pulmonary:     Effort: Pulmonary effort is normal.     Breath sounds: Normal breath sounds. No decreased breath sounds, wheezing, rhonchi or rales.  Lymphadenopathy:     Cervical: No cervical adenopathy.  Skin:    General: Skin is warm and dry.  Neurological:     Mental Status: He is alert.  Psychiatric:        Speech: Speech normal.        Behavior: Behavior normal. Behavior is cooperative.     Assessment and Plan:   Assessment and Plan Assessment & Plan Acute cough with postnasal drip and suspected sinusitis Persistent cough with postnasal drip and nasal congestion suggests sinusitis or upper respiratory infection. - Prescribed azithromycin  for five days. - Recommended Mucinex (guaifenesin). - Advised increased water intake.  Chronic lower extremity swelling due to venous insufficiency Leg swelling attributed to  venous insufficiency; cardiac causes excluded. - Recommended wearing compression socks, considering wider or zip-up options. - Advised leg elevation.        Lucie Buttner, PA-C

## 2023-08-24 NOTE — Patient Instructions (Addendum)
 It was great to see you!  Please trial Guaifenesin for your cough (generic mucinex) Drink 64 oz water daily  Start azithromycin  antibiotic(s)  If symptom(s) do not improve, please let us  know   Take care,  Lucie Buttner PA-C

## 2023-08-28 ENCOUNTER — Telehealth: Payer: Self-pay

## 2023-08-28 NOTE — Telephone Encounter (Signed)
 Spoke to patient Dr.Jordan advised to follow up in 6 months.Patient will call back in Oct to schedule Feb appointment.

## 2023-08-31 ENCOUNTER — Ambulatory Visit: Payer: Self-pay | Admitting: Family Medicine

## 2023-08-31 ENCOUNTER — Other Ambulatory Visit (INDEPENDENT_AMBULATORY_CARE_PROVIDER_SITE_OTHER)

## 2023-08-31 DIAGNOSIS — D729 Disorder of white blood cells, unspecified: Secondary | ICD-10-CM | POA: Diagnosis not present

## 2023-08-31 LAB — CBC WITH DIFFERENTIAL/PLATELET
Basophils Absolute: 0.2 K/uL — ABNORMAL HIGH (ref 0.0–0.1)
Basophils Relative: 2.7 % (ref 0.0–3.0)
Eosinophils Absolute: 0.3 K/uL (ref 0.0–0.7)
Eosinophils Relative: 5.3 % — ABNORMAL HIGH (ref 0.0–5.0)
HCT: 42 % (ref 39.0–52.0)
Hemoglobin: 13.7 g/dL (ref 13.0–17.0)
Lymphocytes Relative: 26.9 % (ref 12.0–46.0)
Lymphs Abs: 1.5 K/uL (ref 0.7–4.0)
MCHC: 32.7 g/dL (ref 30.0–36.0)
MCV: 85 fl (ref 78.0–100.0)
Monocytes Absolute: 0.5 K/uL (ref 0.1–1.0)
Monocytes Relative: 9.1 % (ref 3.0–12.0)
Neutro Abs: 3.1 K/uL (ref 1.4–7.7)
Neutrophils Relative %: 56 % (ref 43.0–77.0)
Platelets: 296 K/uL (ref 150.0–400.0)
RBC: 4.94 Mil/uL (ref 4.22–5.81)
RDW: 15.3 % (ref 11.5–15.5)
WBC: 5.6 K/uL (ref 4.0–10.5)

## 2023-08-31 NOTE — Progress Notes (Signed)
 Blood counts are back to normal. Do not need to do any further testing at this time. We can recheck again in 6-12 months.

## 2023-09-15 ENCOUNTER — Ambulatory Visit (INDEPENDENT_AMBULATORY_CARE_PROVIDER_SITE_OTHER): Admitting: Family Medicine

## 2023-09-15 ENCOUNTER — Encounter: Payer: Self-pay | Admitting: Family Medicine

## 2023-09-15 VITALS — BP 194/79 | HR 56 | Temp 97.6°F | Ht 71.0 in | Wt 243.8 lb

## 2023-09-15 DIAGNOSIS — D692 Other nonthrombocytopenic purpura: Secondary | ICD-10-CM | POA: Insufficient documentation

## 2023-09-15 DIAGNOSIS — M62838 Other muscle spasm: Secondary | ICD-10-CM

## 2023-09-15 DIAGNOSIS — I10 Essential (primary) hypertension: Secondary | ICD-10-CM | POA: Diagnosis not present

## 2023-09-15 LAB — CBC
HCT: 42.8 % (ref 39.0–52.0)
Hemoglobin: 13.8 g/dL (ref 13.0–17.0)
MCHC: 32.3 g/dL (ref 30.0–36.0)
MCV: 84.9 fl (ref 78.0–100.0)
Platelets: 256 K/uL (ref 150.0–400.0)
RBC: 5.04 Mil/uL (ref 4.22–5.81)
RDW: 15.7 % — ABNORMAL HIGH (ref 11.5–15.5)
WBC: 5.7 K/uL (ref 4.0–10.5)

## 2023-09-15 LAB — COMPREHENSIVE METABOLIC PANEL WITH GFR
ALT: 16 U/L (ref 0–53)
AST: 19 U/L (ref 0–37)
Albumin: 3.9 g/dL (ref 3.5–5.2)
Alkaline Phosphatase: 98 U/L (ref 39–117)
BUN: 20 mg/dL (ref 6–23)
CO2: 30 meq/L (ref 19–32)
Calcium: 9.1 mg/dL (ref 8.4–10.5)
Chloride: 106 meq/L (ref 96–112)
Creatinine, Ser: 1.24 mg/dL (ref 0.40–1.50)
GFR: 57.88 mL/min — ABNORMAL LOW (ref >60.00)
Glucose, Bld: 86 mg/dL (ref 70–99)
Potassium: 4.7 meq/L (ref 3.5–5.1)
Sodium: 143 meq/L (ref 135–145)
Total Bilirubin: 0.5 mg/dL (ref 0.2–1.2)
Total Protein: 6.5 g/dL (ref 6.0–8.3)

## 2023-09-15 LAB — FOLATE: Folate: 22.2 ng/mL (ref >5.9)

## 2023-09-15 LAB — VITAMIN B12: Vitamin B-12: 367 pg/mL (ref 211–911)

## 2023-09-15 LAB — MAGNESIUM: Magnesium: 2.3 mg/dL (ref 1.5–2.5)

## 2023-09-15 LAB — VITAMIN D 25 HYDROXY (VIT D DEFICIENCY, FRACTURES): VITD: 28.65 ng/mL — ABNORMAL LOW (ref 30.00–100.00)

## 2023-09-15 LAB — TSH: TSH: 7.49 u[IU]/mL — ABNORMAL HIGH (ref 0.35–5.50)

## 2023-09-15 NOTE — Assessment & Plan Note (Signed)
 No red flags.  Reassured patient.  Potentially due to NSAID use.  Will check labs today.

## 2023-09-15 NOTE — Progress Notes (Signed)
   Dale Wood is a 73 y.o. male who presents today for an office visit.  Assessment/Plan:  New/Acute Problems: Abdominal Wall Spasm  No red flags.  History consistent with skeletal muscle spasm.  Does have rectus diastases which is known and may be contributing. Overall reassuring exam.  Will check labs today including c-Met, CBC, B vitamins, and TSH.  We encouraged hydration.    Chronic Problems Addressed Today: Senile purpura (HCC) No red flags.  Reassured patient.  Potentially due to NSAID use.  Will check labs today.  Hypertension Elevated today though was at goal when he was here a month ago.  Home readings have typically been at goal.  We are checking labs today.  No symptoms or signs of end organ damage.  He will monitor at home and let us  know if persistently elevated.     Subjective:  HPI:  See assessment / plan for status of chronic conditions.   Discussed the use of AI scribe software for clinical note transcription with the patient, who gave verbal consent to proceed.  History of Present Illness Dale Wood is a 73 year old male who presents with abdominal spasms and easy bruising.  He has been experiencing abdominal spasms for the past one to two months, located from the umbilicus upwards. The spasms are described as similar to 'a baby's kicking' and are visible through his shirt. They occur intermittently without any identified triggers and are not present in other parts of his body.  He experiences easy bruising, developing bruises easily upon minor impacts. He denies frequent use of NSAIDs, such as meloxicam  or ibuprofen , and does not take aspirin regularly. He is concerned about a potential vitamin deficiency contributing to the bruising.  He takes Lasix  daily, usually with breakfast, but had not taken it on the morning of the visit. He mentions issues with leg swelling and is awaiting the arrival of wide compression stockings, as regular ones cut off  circulation.         Objective:  Physical Exam: BP (!) 194/79   Pulse (!) 56   Temp 97.6 F (36.4 C) (Temporal)   Ht 5' 11 (1.803 m)   Wt 243 lb 12.8 oz (110.6 kg)   SpO2 97%   BMI 34.00 kg/m   Gen: No acute distress, resting comfortably CV: Regular rate and rhythm with no murmurs appreciated Pulm: Normal work of breathing, clear to auscultation bilaterally with no crackles, wheezes, or rhonchi GI: Bowel sounds present, no deformities.  Soft, nontender, nondistended.  Rectal diastases noted. Skin: Scattered senile purpura on upper extremities noted. Neuro: Grossly normal, moves all extremities Psych: Normal affect and thought content      Randall Colden M. Kennyth, MD 09/15/2023 8:49 AM

## 2023-09-15 NOTE — Assessment & Plan Note (Signed)
 Elevated today though was at goal when he was here a month ago.  Home readings have typically been at goal.  We are checking labs today.  No symptoms or signs of end organ damage.  He will monitor at home and let us  know if persistently elevated.

## 2023-09-15 NOTE — Patient Instructions (Addendum)
 It was very nice to see you today!  VISIT SUMMARY: During your visit, we discussed your abdominal spasms and easy bruising. We also reviewed your blood pressure and current medications.  YOUR PLAN: ABDOMINAL WALL MUSCLE SPASM: You have been experiencing intermittent abdominal spasms, likely due to an electrolyte imbalance from your Lasix  use. -We will order blood work to check your magnesium, potassium, sodium, calcium , and B vitamin levels.  SENILE PURPURA: Your easy bruising is due to age-related skin thinning and is primarily a cosmetic issue. -We will order blood work to check for any vitamin deficiencies.  ESSENTIAL HYPERTENSION: Your blood pressure was elevated today, possibly due to a missed medication dose and stress. -We will recheck your blood pressure before you leave the office.  Return for Annual Physical.   Take care, Dr Kennyth  PLEASE NOTE:  If you had any lab tests, please let us  know if you have not heard back within a few days. You may see your results on mychart before we have a chance to review them but we will give you a call once they are reviewed by us .   If we ordered any referrals today, please let us  know if you have not heard from their office within the next week.   If you had any urgent prescriptions sent in today, please check with the pharmacy within an hour of our visit to make sure the prescription was transmitted appropriately.   Please try these tips to maintain a healthy lifestyle:  Eat at least 3 REAL meals and 1-2 snacks per day.  Aim for no more than 5 hours between eating.  If you eat breakfast, please do so within one hour of getting up.   Each meal should contain half fruits/vegetables, one quarter protein, and one quarter carbs (no bigger than a computer mouse)  Cut down on sweet beverages. This includes juice, soda, and sweet tea.   Drink at least 1 glass of water with each meal and aim for at least 8 glasses per day  Exercise at least  150 minutes every week.

## 2023-09-19 LAB — VITAMIN B1: Vitamin B1 (Thiamine): 16 nmol/L (ref 8–30)

## 2023-09-21 ENCOUNTER — Ambulatory Visit: Payer: Self-pay | Admitting: Family Medicine

## 2023-09-21 NOTE — Progress Notes (Signed)
 His thyroid  level is off.  Can we verify that he has been taking his Synthroid  25 mcg daily?  If so recommend we increase to 50 mcg daily and recheck in 4 to 6 weeks.  Vitamin D  is a little bit low.  Recommend he start 5000 IU daily.  We should recheck in 3 to 6 months.  The rest of his labs are all at goal.

## 2023-09-22 MED ORDER — LEVOTHYROXINE SODIUM 50 MCG PO TABS: 50.0000 ug | ORAL_TABLET | Freq: Every day | ORAL | 3 refills | Status: AC

## 2023-09-28 ENCOUNTER — Ambulatory Visit

## 2023-10-05 DIAGNOSIS — M1711 Unilateral primary osteoarthritis, right knee: Secondary | ICD-10-CM | POA: Diagnosis not present

## 2023-10-10 ENCOUNTER — Telehealth: Payer: Self-pay

## 2023-10-10 NOTE — Telephone Encounter (Signed)
   Pre-operative Risk Assessment    Patient Name: Dale Wood  DOB: 1950/03/08 MRN: [id number]   Date of last office visit: 07/03/23 Date of next office visit: Not scheduled   Request for Surgical Clearance    Procedure:  Right total knee arthroplasty  Date of Surgery:  Clearance 01/15/24                                Surgeon:  Dr. Dempsey Moan Surgeon's Group or Practice Name:  [facility] Phone number:  [phone] Fax number:  [phone]   Type of Clearance Requested:   - Medical    Type of Anesthesia:  Choice   Additional requests/questions:    [name]   10/10/2023, 5:00 PM

## 2023-10-11 ENCOUNTER — Telehealth: Payer: Self-pay

## 2023-10-11 NOTE — Telephone Encounter (Signed)
 Patient has been scheduled for televisit and consent done     Patient Consent for Virtual Visit         Dale Wood has provided verbal consent on 10/11/2023 for a virtual visit (video or telephone).   CONSENT FOR VIRTUAL VISIT FOR:  Dale Wood  By participating in this virtual visit I agree to the following:  I hereby voluntarily request, consent and authorize [facility] and its employed or contracted physicians, physician assistants, nurse practitioners or other licensed health care professionals (the Practitioner), to provide me with telemedicine health care services (the "Services) as deemed necessary by the treating Practitioner. I acknowledge and consent to receive the Services by the Practitioner via telemedicine. I understand that the telemedicine visit will involve communicating with the Practitioner through live audiovisual communication technology and the disclosure of certain medical information by electronic transmission. I acknowledge that I have been given the opportunity to request an in-person assessment or other available alternative prior to the telemedicine visit and am voluntarily participating in the telemedicine visit.  I understand that I have the right to withhold or withdraw my consent to the use of telemedicine in the course of my care at any time, without affecting my right to future care or treatment, and that the Practitioner or I may terminate the telemedicine visit at any time. I understand that I have the right to inspect all information obtained and/or recorded in the course of the telemedicine visit and may receive copies of available information for a reasonable fee.  I understand that some of the potential risks of receiving the Services via telemedicine include:  Delay or interruption in medical evaluation due to technological equipment failure or disruption; Information transmitted may not be sufficient (e.g. poor resolution of images) to  allow for appropriate medical decision making by the Practitioner; and/or  In rare instances, security protocols could fail, causing a breach of personal health information.  Furthermore, I acknowledge that it is my responsibility to provide information about my medical history, conditions and care that is complete and accurate to the best of my ability. I acknowledge that Practitioner's advice, recommendations, and/or decision may be based on factors not within their control, such as incomplete or inaccurate data provided by me or distortions of diagnostic images or specimens that may result from electronic transmissions. I understand that the practice of medicine is not an exact science and that Practitioner makes no warranties or guarantees regarding treatment outcomes. I acknowledge that a copy of this consent can be made available to me via my patient portal [facility] MyChart), or I can request a printed copy by calling the office of [facility].    I understand that my insurance will be billed for this visit.   I have read or had this consent read to me. I understand the contents of this consent, which adequately explains the benefits and risks of the Services being provided via telemedicine.  I have been provided ample opportunity to ask questions regarding this consent and the Services and have had my questions answered to my satisfaction. I give my informed consent for the services to be provided through the use of telemedicine in my medical care

## 2023-10-11 NOTE — Telephone Encounter (Signed)
Patient has been scheduled for televisit.

## 2023-10-11 NOTE — Telephone Encounter (Signed)
   Name: Dale Wood  DOB: 08/13/1950  MRN: [id number]  Primary Cardiologist: Dr. [place]   Preoperative team, please contact this patient and set up a phone call appointment for further preoperative risk assessment. Please obtain consent and complete medication review. Thank you for your help.  I confirm that guidance regarding antiplatelet and oral anticoagulation therapy has been completed and, if necessary, noted below.  Patient is not on anticoagulation or antiplatelet per review of medical record in Epic.    I also confirmed the patient resides in the state of [place] . As per [facility] Medical Board telemedicine laws, the patient must reside in the state in which the provider is licensed.   Lamarr Satterfield, NP 10/11/2023, 8:22 AM [facility]

## 2023-10-13 ENCOUNTER — Telehealth: Payer: Self-pay | Admitting: Family Medicine

## 2023-10-13 NOTE — Telephone Encounter (Signed)
 [facility] faxed Surgical Clearance, to be filled out by provider. Patient requested to send it back via Fax within ASAP. Document is located in providers tray at front office.Please advise at [phone].

## 2023-10-16 NOTE — Telephone Encounter (Signed)
Form placed in PCP office to be reviewed  

## 2023-10-19 ENCOUNTER — Ambulatory Visit (INDEPENDENT_AMBULATORY_CARE_PROVIDER_SITE_OTHER)

## 2023-10-19 VITALS — BP 162/82 | HR 68 | Temp 98.5°F | Ht 71.5 in | Wt 247.0 lb

## 2023-10-19 DIAGNOSIS — Z0000 Encounter for general adult medical examination without abnormal findings: Secondary | ICD-10-CM

## 2023-10-19 NOTE — Progress Notes (Signed)
 Subjective:   Boss Danielsen is a 73 y.o. who presents for a Medicare Wellness preventive visit.  As a reminder, Annual Wellness Visits don't include a physical exam, and some assessments may be limited, especially if this visit is performed virtually. We may recommend an in-person follow-up visit with your provider if needed.  Visit Complete: In person    Persons Participating in Visit: Patient.  AWV Questionnaire: Yes: Patient Medicare AWV questionnaire was completed by the patient on 10/18/23; I have confirmed that all information answered by patient is correct and no changes since this date.  Cardiac Risk Factors include: dyslipidemia;hypertension;advanced age (>39men, >26 women);obesity (BMI >30kg/m2);male gender     Objective:    Today's Vitals   10/18/23 1322 10/19/23 0751 10/19/23 0808  BP:  (!) 156/80 (!) 162/82  Pulse:  68   Temp:  98.5 F (36.9 C)   SpO2:  95%   Weight:  247 lb (112 kg)   Height:  5' 11.5 (1.816 m)   PainSc: 6  6    PainLoc:  Back    Body mass index is 33.97 kg/m.     10/19/2023    8:03 AM 09/27/2022    3:19 PM 01/13/2022    6:51 PM 01/04/2022   10:33 PM 12/06/2021    3:27 PM 11/12/2020    3:20 PM 03/18/2020   12:58 PM  Advanced Directives  Does Patient Have a Medical Advance Directive? No No No No No Yes No  Does patient want to make changes to medical advance directive?      No - Patient declined   Would patient like information on creating a medical advance directive? No - Patient declined No - Patient declined No - Patient declined No - Patient declined No - Patient declined  No - Patient declined    Current Medications (verified) Outpatient Encounter Medications as of 10/19/2023  Medication Sig   acetaminophen  (TYLENOL ) 500 MG tablet Take 500 mg by mouth every 6 (six) hours as needed for mild pain (pain score 1-3).   furosemide  (LASIX ) 40 MG tablet Take 1 tablet (40 mg total) by mouth daily as needed.   levothyroxine  (SYNTHROID )  50 MCG tablet Take 1 tablet (50 mcg total) by mouth daily.   losartan  (COZAAR ) 100 MG tablet TAKE 1 TABLET(100 MG) BY MOUTH DAILY   methocarbamol (ROBAXIN) 500 MG tablet Take 500-1,000 mg by mouth every 6 (six) hours as needed.   Multiple Vitamins-Minerals (ONE-A-DAY MENS 50+) TABS Take 1 tablet by mouth daily.   niacin (VITAMIN B3) 500 MG tablet Take 500 mg by mouth at bedtime.   meloxicam  (MOBIC ) 15 MG tablet  (Patient not taking: Reported on 10/19/2023)   No facility-administered encounter medications on file as of 10/19/2023.    Allergies (verified) Androgel [testosterone] and Penicillins   History: Past Medical History:  Diagnosis Date   Abdominal pain, epigastric 10/27/2008   Qualifier: Diagnosis of  By: Joshua MD, Debby FREDRIK MO INJURY, RIGHT 09/22/2009   Qualifier: Diagnosis of  By: Vicenta, CMA, [name]     Basal cell carcinoma 10/29/2012   left forearm tx cx3 20fu   BASAL CELL CARCINOMA, FACE 10/27/2008   Qualifier: Diagnosis of  By: Joshua MD, Debby CROME.    BASAL CELL CARCINOMA, FACE 10/27/2008   Qualifier: Diagnosis of By: Joshua MD, Debby CROME.    Cancer [facility])    Skin Cancer on Nose   CELLULITIS, FOOT 09/22/2009   Qualifier: Diagnosis of  By: Joshua  MD, Debby CROME.    Constipation 01/05/2017   DYSMETABOLIC SYNDROME 02/05/2009   Qualifier: Diagnosis of  By: Joshua MD, Debby CROME.    DYSPEPSIA 10/27/2008   Qualifier: Diagnosis of  By: Joshua MD, Debby CROME.    ERECTILE DYSFUNCTION, ORGANIC 10/27/2008   Qualifier: Diagnosis of  By: Joshua MD, Debby CROME.    Headache 07/30/2015   Heart murmur    History of kidney stones    about 30 passed   Hyperlipidemia    HYPERLIPIDEMIA 10/27/2008   Qualifier: Diagnosis of  By: Joshua MD, Debby CROME.    Hypertension    HYPOGONADISM 11/26/2008   Qualifier: Diagnosis of  By: Joshua MD, Debby CROME.    Hypothyroidism    Kidney stones    Low testosterone    NEPHROLITHIASIS, HX OF 10/27/2008   Qualifier: Diagnosis of  By: Joshua MD, Debby CROME.     Nephrolithiasis, uric acid    OSTEOARTHRITIS 10/27/2008   Qualifier: Diagnosis of  By: Joshua MD, Debby CROME.    Positive TB test    Postherpetic neuralgia 03/27/2017   Rheumatic fever    Rheumatic fever    SKIN CANCER, HX OF 10/27/2008   Qualifier: Diagnosis of  By: Joshua MD, Debby CROME.    Squamous cell carcinoma of skin 11/27/2013   left shin tx with bx curet and 5 fu   Tachycardia 11/09/2017   Thyroid  disease    Tuberculosis 1986 ish   treated   Unspecified disorder of kidney and ureter    URI 02/05/2009   Qualifier: Diagnosis of  By: Joshua MD, Debby CROME.    Past Surgical History:  Procedure Laterality Date   CHOLECYSTECTOMY N/A 03/18/2020   Procedure: LAPAROSCOPIC CHOLECYSTECTOMY;  Surgeon: Vernetta Berg, MD;  Location: [facility] OR;  Service: General;  Laterality: N/A;  60 MIN TOTAL - ROOM 10   KNEE SURGERY Right    2005, 2008. Scope for MCL tear   TONSILLECTOMY AND ADENOIDECTOMY     UMBILICAL HERNIA REPAIR N/A 03/18/2020   Procedure: HERNIA REPAIR UMBILICAL ADULT;  Surgeon: Vernetta Berg, MD;  [facility];  Service: General;  Laterality: N/A;   Family History  Problem Relation Age of Onset   Cancer Mother    Diabetes Mother    Cancer Father    Early death Father    Diabetes Brother    Cancer Brother        prostate   Drug abuse Brother    Birth defects Sister    Diabetes Maternal Grandmother    Thyroid  disease Maternal Grandmother    Stroke Maternal Grandmother    Tremor Maternal Grandfather    Obesity Son    Social History   Socioeconomic History   Marital status: Married    Spouse name: Not on file   Number of children: Not on file   Years of education: Not on file   Highest education level: Some college, no degree  Occupational History   Not on file  Tobacco Use   Smoking status: Former    Types: Software engineer, Pipe    Quit date: 2001    Years since quitting: 24.7   Smokeless tobacco: Never  Vaping Use   Vaping status: Never Used  Substance and Sexual  Activity   Alcohol use: Yes    Comment: 1-2 times a year   Drug use: No   Sexual activity: Yes  Other Topics Concern   Not on file  Social History Narrative   Not on file  Social Drivers of Corporate investment banker Strain: Low Risk  (10/19/2023)   Overall Financial Resource Strain (CARDIA)    Difficulty of Paying Living Expenses: Not hard at all  Food Insecurity: No Food Insecurity (10/18/2023)   Hunger Vital Sign    Worried About Running Out of Food in the Last Year: Never true    Ran Out of Food in the Last Year: Never true  Transportation Needs: Unknown (10/18/2023)   PRAPARE - Administrator, Civil Service (Medical): No    Lack of Transportation (Non-Medical): Not on file  Physical Activity: Sufficiently Active (10/18/2023)   Exercise Vital Sign    Days of Exercise per Week: 5 days    Minutes of Exercise per Session: 60 min  Stress: No Stress Concern Present (10/18/2023)   [employer] of Occupational Health - Occupational Stress Questionnaire    Feeling of Stress: Not at all  Social Connections: Socially Integrated (10/18/2023)   Social Connection and Isolation Panel    Frequency of Communication with Friends and Family: More than three times a week    Frequency of Social Gatherings with Friends and Family: Once a week    Attends Religious Services: More than 4 times per year    Active Member of [employer] or Organizations: Yes    Attends Engineer, structural: More than 4 times per year    Marital Status: Married    Tobacco Counseling Counseling given: Not Answered    Clinical Intake:  Pre-visit preparation completed: Yes  Pain : 0-10 Pain Score: 6  Pain Type: Chronic pain Pain Location: Knee Pain Orientation: Right Pain Descriptors / Indicators: Aching Pain Onset: More than a month ago Pain Frequency: Constant     BMI - recorded: 33.97 Nutritional Status: BMI > 30  Obese  Lab Results  Component Value Date   HGBA1C 6.2 06/16/2023    HGBA1C 6.4 09/28/2021   HGBA1C 6.2 (H) 12/10/2019     How often do you need to have someone help you when you read instructions, pamphlets, or other written materials from your doctor or pharmacy?: 1 - Never  Interpreter Needed?: No  Information entered by :: Ellouise Haws, LPN   Activities of Daily Living     10/18/2023    1:22 PM  In your present state of health, do you have any difficulty performing the following activities:  Hearing? 1  Comment hearing aids  Vision? 0  Difficulty concentrating or making decisions? 0  Walking or climbing stairs? 0  Dressing or bathing? 0  Doing errands, shopping? 0  Preparing Food and eating ? N  Using the Toilet? N  In the past six months, have you accidently leaked urine? N  Do you have problems with loss of bowel control? N  Managing your Medications? N  Managing your Finances? N  Housekeeping or managing your Housekeeping? N    Patient Care Team: Kennyth Worth HERO, MD as PCP - General (Family Medicine) Delford Maude BROCKS, MD as Consulting Physician (Cardiology) Abigail Maude POUR as Consulting Physician (Optometry) Pandora Cadet, [facility] as Pharmacist (Pharmacist)  I have updated your Care Teams any recent Medical Services you may have received from other providers in the past year.     Assessment:   This is a routine wellness examination for Jasiri.  Hearing/Vision screen Hearing Screening - Comments:: Hearing aids  Vision Screening - Comments:: Wears rx glasses - up to date with routine eye exams with Dr Abigail    Goals  Addressed             This Visit's Progress    Patient Stated       Weight loss  loss mid section        Depression Screen     10/19/2023    8:03 AM 09/15/2023    8:10 AM 08/03/2023    2:26 PM 06/21/2023    8:02 AM 06/16/2023    7:58 AM 09/27/2022    3:20 PM 02/03/2022    2:02 PM  PHQ 2/9 Scores  PHQ - 2 Score 0 0 0 0 0 0 0    Fall Risk     10/18/2023    1:22 PM 09/15/2023    8:10 AM 08/03/2023    2:26 PM  06/21/2023    8:02 AM 06/16/2023    7:58 AM  Fall Risk   Falls in the past year? 0 0 0 0 1  Number falls in past yr: 0 0 0 0 0  Injury with Fall? 0 0 0 0 1  Risk for fall due to : Impaired mobility No Fall Risks No Fall Risks No Fall Risks No Fall Risks  Follow up Falls prevention discussed        MEDICARE RISK AT HOME:  Medicare Risk at Home Any stairs in or around the home?: (Patient-Rptd) Yes If so, are there any without handrails?: (Patient-Rptd) No Home free of loose throw rugs in walkways, pet beds, electrical cords, etc?: (Patient-Rptd) No Adequate lighting in your home to reduce risk of falls?: (Patient-Rptd) Yes Life alert?: (Patient-Rptd) No Use of a cane, walker or w/c?: (Patient-Rptd) No Grab bars in the bathroom?: (Patient-Rptd) Yes Shower chair or bench in shower?: (Patient-Rptd) Yes Elevated toilet seat or a handicapped toilet?: (Patient-Rptd) Yes  TIMED UP AND GO:  Was the test performed?  Yes  Length of time to ambulate 10 feet: 20 sec Gait slow and steady without use of assistive device  Cognitive Function: 6CIT completed        10/19/2023    8:05 AM 09/27/2022    3:23 PM 12/06/2021    3:29 PM 11/12/2020    3:23 PM 11/07/2019    3:19 PM  6CIT Screen  What Year? 0 points 0 points 0 points 0 points 0 points  What month? 0 points 0 points 0 points 0 points 0 points  What time? 0 points 0 points 0 points 0 points   Count back from 20 0 points 0 points 0 points 0 points 0 points  Months in reverse 0 points 0 points 0 points 0 points 0 points  Repeat phrase 0 points 0 points 0 points 0 points 0 points  Total Score 0 points 0 points 0 points 0 points     Immunizations Immunization History  Administered Date(s) Administered    sv, Bivalent, Protein Subunit Rsvpref,pf (Abrysvo) 12/18/2021   Fluad Quad(high Dose 65+) 09/18/2018   INFLUENZA, HIGH DOSE SEASONAL PF 11/22/2016   Influenza-Unspecified 09/17/2016, 12/18/2021   PFIZER(Purple Top)SARS-COV-2  Vaccination 03/30/2019, 04/20/2019, 11/05/2019, 05/03/2020, 07/05/2021   Pfizer Covid-19 Vaccine Bivalent Booster 19yrs & up 12/11/2020   Pneumococcal Conjugate-13 11/22/2016   Pneumococcal Polysaccharide-23 01/15/2019   Td 01/17/2001   Tdap 08/25/2010   Zoster Recombinant(Shingrix) 10/25/2019, 12/25/2019    Screening Tests Health Maintenance  Topic Date Due   COVID-19 Vaccine (7 - 2025-26 season) 09/18/2023   Influenza Vaccine  04/16/2024 (Originally 08/18/2023)   DTaP/Tdap/Td (3 - Td or Tdap) 06/15/2024 (Originally 08/24/2020)   Colonoscopy  06/15/2024 (Originally 10/11/2020)   Medicare Annual Wellness (AWV)  10/18/2024   Pneumococcal Vaccine: 50+ Years  Completed   Hepatitis C Screening  Completed   Zoster Vaccines- Shingrix  Completed   HPV VACCINES  Aged Out   Meningococcal B Vaccine  Aged Out    Health Maintenance Items Addressed: See Nurse Notes at the end of this note  Additional Screening:  Vision Screening: Recommended annual ophthalmology exams for early detection of glaucoma and other disorders of the eye. Is the patient up to date with their annual eye exam?  Yes  Who is the provider or what is the name of the office in which the patient attends annual eye exams? Dr  Abigail   Dental Screening: Recommended annual dental exams for proper oral hygiene  Community Resource Referral / Chronic Care Management: CRR required this visit?  No   CCM required this visit?  No   Plan:    I have personally reviewed and noted the following in the patient's chart:   Medical and social history Use of alcohol, tobacco or illicit drugs  Current medications and supplements including opioid prescriptions. Patient is not currently taking opioid prescriptions. Functional ability and status Nutritional status Physical activity Advanced directives List of other physicians Hospitalizations, surgeries, and ER visits in previous 12 months Vitals Screenings to include cognitive,  depression, and falls Referrals and appointments  In addition, I have reviewed and discussed with patient certain preventive protocols, quality metrics, and best practice recommendations. A written personalized care plan for preventive services as well as general preventive health recommendations were provided to patient.   Ellouise VEAR Haws, LPN   89/07/7972   After Visit Summary: (In Person-Declined) Patient declined AVS at this time.  Notes: PCP Follow Up Recommendations: Pt given instruction to follow up with blood pressure and  make an appt to see PCP

## 2023-10-19 NOTE — Patient Instructions (Signed)
 Dale Wood,  Thank you for taking the time for your Medicare Wellness Visit. I appreciate your continued commitment to your health goals. Please review the care plan we discussed, and feel free to reach out if I can assist you further.  Medicare recommends these wellness visits once per year to help you and your care team stay ahead of potential health issues. These visits are designed to focus on prevention, allowing your provider to concentrate on managing your acute and chronic conditions during your regular appointments.  Please note that Annual Wellness Visits do not include a physical exam. Some assessments may be limited, especially if the visit was conducted virtually. If needed, we may recommend a separate in-person follow-up with your provider.  Ongoing Care Seeing your primary care provider every 3 to 6 months helps us  monitor your health and provide consistent, personalized care.   Referrals If a referral was made during today's visit and you haven't received any updates within two weeks, please contact the referred provider directly to check on the status.  Recommended Screenings:  Health Maintenance  Topic Date Due   COVID-19 Vaccine (7 - 2025-26 season) 09/18/2023   Medicare Annual Wellness Visit  09/27/2023   Flu Shot  04/16/2024*   DTaP/Tdap/Td vaccine (3 - Td or Tdap) 06/15/2024*   Colon Cancer Screening  06/15/2024*   Pneumococcal Vaccine for age over 3  Completed   Hepatitis C Screening  Completed   Zoster (Shingles) Vaccine  Completed   HPV Vaccine  Aged Out   Meningitis B Vaccine  Aged Out  *Topic was postponed. The date shown is not the original due date.       09/27/2022    3:19 PM  Advanced Directives  Does Patient Have a Medical Advance Directive? No  Would patient like information on creating a medical advance directive? No - Patient declined   Advance Care Planning is important because it: Ensures you receive medical care that aligns with your values,  goals, and preferences. Provides guidance to your family and loved ones, reducing the emotional burden of decision-making during critical moments.  Vision: Annual vision screenings are recommended for early detection of glaucoma, cataracts, and diabetic retinopathy. These exams can also reveal signs of chronic conditions such as diabetes and high blood pressure.  Dental: Annual dental screenings help detect early signs of oral cancer, gum disease, and other conditions linked to overall health, including heart disease and diabetes.  Please see the attached documents for additional preventive care recommendations.

## 2023-10-23 NOTE — Telephone Encounter (Signed)
 Form faxed to [phone]  Form placed to be scan in Patient chart

## 2023-10-27 ENCOUNTER — Other Ambulatory Visit: Payer: Self-pay | Admitting: Family Medicine

## 2023-11-17 ENCOUNTER — Ambulatory Visit (INDEPENDENT_AMBULATORY_CARE_PROVIDER_SITE_OTHER): Admitting: Family Medicine

## 2023-11-17 ENCOUNTER — Encounter: Payer: Self-pay | Admitting: Family Medicine

## 2023-11-17 ENCOUNTER — Ambulatory Visit: Payer: Self-pay

## 2023-11-17 VITALS — BP 128/68 | HR 84 | Temp 98.8°F | Resp 18 | Ht 71.0 in | Wt 245.0 lb

## 2023-11-17 DIAGNOSIS — I10 Essential (primary) hypertension: Secondary | ICD-10-CM | POA: Diagnosis not present

## 2023-11-17 DIAGNOSIS — E559 Vitamin D deficiency, unspecified: Secondary | ICD-10-CM | POA: Diagnosis not present

## 2023-11-17 DIAGNOSIS — R600 Localized edema: Secondary | ICD-10-CM

## 2023-11-17 MED ORDER — AMLODIPINE BESYLATE 5 MG PO TABS: 5.0000 mg | ORAL_TABLET | Freq: Every day | ORAL | 3 refills | Status: AC

## 2023-11-17 NOTE — Patient Instructions (Signed)
 It was very nice to see you today!  VISIT SUMMARY: You visited us  today to manage your blood pressure and prepare for your upcoming surgery. We discussed your fluctuating blood pressure readings and made adjustments to your medication to help control it better. We also talked about your leg swelling and how it might be affected by the new medication.  YOUR PLAN: ESSENTIAL HYPERTENSION: Your blood pressure has been fluctuating, and we need to ensure it is well-controlled for your upcoming surgery. -Continue taking losartan  at the current dose. -Start taking amlodipine  5 mg daily. -Use the provided blood pressure tracking sheet to monitor your blood pressure at home. -Report your blood pressure readings to us  next week. -We will recheck your blood pressure before you leave the office today.  LOWER EXTREMITY SWELLING: You have occasional swelling in your legs, which might be affected by the new medication. -Monitor for any changes in leg swelling with the addition of amlodipine . -Let us  know if the swelling worsens or becomes bothersome.  Return in about 1 week (around 11/24/2023).   Take care, Dr Kennyth  PLEASE NOTE:  If you had any lab tests, please let us  know if you have not heard back within a few days. You may see your results on mychart before we have a chance to review them but we will give you a call once they are reviewed by us .   If we ordered any referrals today, please let us  know if you have not heard from their office within the next week.   If you had any urgent prescriptions sent in today, please check with the pharmacy within an hour of our visit to make sure the prescription was transmitted appropriately.   Please try these tips to maintain a healthy lifestyle:  Eat at least 3 REAL meals and 1-2 snacks per day.  Aim for no more than 5 hours between eating.  If you eat breakfast, please do so within one hour of getting up.   Each meal should contain half  fruits/vegetables, one quarter protein, and one quarter carbs (no bigger than a computer mouse)  Cut down on sweet beverages. This includes juice, soda, and sweet tea.   Drink at least 1 glass of water with each meal and aim for at least 8 glasses per day  Exercise at least 150 minutes every week.

## 2023-11-17 NOTE — Assessment & Plan Note (Signed)
 He will double check to make sure that he is taking the vitamin D  supplement when he gets home.  We can recheck in 3 to 6 months.

## 2023-11-17 NOTE — Assessment & Plan Note (Signed)
 Blood pressure at goal today during office visit however has had intermittent elevations over the last several months and was elevated at his most recent office visit here.  He is on losartan  100 mg daily and has been working on reducing sodium intake and other lifestyle interventions.  Given his intermittent elevations at home especially with extreme elevations into the 200s would be reasonable for us  to start additional antihypertensive therapy at this point.  We did discuss potential options including trial of diuretic such as HCTZ or spironolactone versus restarting amlodipine .  He would like to avoid a diuretic for now due to concern for increased urinary frequency.  Will start amlodipine  5 mg daily.  He does have some leg edema but this is manageable with compression stockings.  He will follow-up with us  in a week or so via MyChart and we can adjust medications as needed.

## 2023-11-17 NOTE — Telephone Encounter (Signed)
 FYI Only or Action Required?: Action required by provider: request for appointment, clinical question for provider, and update on patient condition.  Patient was last seen in primary care on 09/15/2023 by Kennyth Worth HERO, MD.  Called Nurse Triage reporting No chief complaint on file..  Symptoms began yesterday.  Interventions attempted: Prescription medications: Losartan .  Symptoms are: gradually worsening.  Triage Disposition: See Physician Within 24 Hours  Patient/caregiver understands and will follow disposition?: Yes   Copied from CRM #[id number]. Topic: Clinical - Red Word Triage >> Nov 17, 2023  9:18 AM Amy B wrote: Red Word that prompted transfer to Nurse Triage: High blood pressure 201/101 Reason for Disposition  Systolic BP >= 180 OR Diastolic >= 110  Answer Assessment - Initial Assessment Questions Patient, Dale Wood, states he is having surgery soon and if his blood pressure is 180 or above they will not do the surgery. He needs Dr. Kennyth to get him something to get his blood pressure down.   1. BLOOD PRESSURE: What is your blood pressure? Did you take at least two measurements 5 minutes apart?      201/101, 193/102   2. ONSET: When did you take your blood pressure?     Last night, Second reading during triage and without medication  3. HOW: How did you take your blood pressure? (e.g., automatic home BP monitor, visiting nurse)     Automatic BP Monitor  4. HISTORY: Do you have a history of high blood pressure?     Yes  5. MEDICINES: Are you taking any medicines for blood pressure? Have you missed any doses recently?     Yes, No missed doses  6. OTHER SYMPTOMS: Do you have any symptoms? (e.g., blurred vision, chest pain, difficulty breathing, headache, weakness)     Denies any other symptoms  Protocols used: Blood Pressure - High-A-AH

## 2023-11-17 NOTE — Telephone Encounter (Signed)
 Appt today

## 2023-11-17 NOTE — Progress Notes (Signed)
 Trayden Brandy is a 73 y.o. male who presents today for an office visit.  Assessment/Plan:  Chronic Problems Addressed Today: Hypertension Blood pressure at goal today during office visit however has had intermittent elevations over the last several months and was elevated at his most recent office visit here.  He is on losartan  100 mg daily and has been working on reducing sodium intake and other lifestyle interventions.  Given his intermittent elevations at home especially with extreme elevations into the 200s would be reasonable for us  to start additional antihypertensive therapy at this point.  We did discuss potential options including trial of diuretic such as HCTZ or spironolactone versus restarting amlodipine .  He would like to avoid a diuretic for now due to concern for increased urinary frequency.  Will start amlodipine  5 mg daily.  He does have some leg edema but this is manageable with compression stockings.  He will follow-up with us  in a week or so via MyChart and we can adjust medications as needed.  Leg edema Symptoms are currently manageable.  We did discuss that amlodipine  may cause extra fluid retention however symptoms are currently manageable with compression stockings.  He will be following up with us  in a week.  Vitamin D  deficiency He will double check to make sure that he is taking the vitamin D  supplement when he gets home.  We can recheck in 3 to 6 months.     Subjective:  HPI:  See assessment / plan for status of chronic conditions.   Discussed the use of AI scribe software for clinical note transcription with the patient, who gave verbal consent to proceed.  History of Present Illness Slayde Brault is a 74 year old male with hypertension who presents for blood pressure management and upcoming surgery preparation.  He has been experiencing fluctuating blood pressure readings. This morning, his blood pressure was 201 mmHg, and it was 181 mmHg after  returning home from work. He regularly monitors his blood pressure and notes that it often takes multiple attempts to get it below 180 mmHg during medical visits. He is currently on the maximum dose of losartan .  No headaches, lightheadedness, numbness, tingling, chest pain, or shortness of breath. He experiences swelling in his legs, which he manages by wearing compression socks occasionally. He also reports difficulty sleeping, getting about five and a half to six hours of interrupted sleep per night due to being woken up by his dogs.  He has significantly reduced his sodium intake, cutting out chips, crackers, and salted nuts, and only occasionally consuming microwave popcorn and lightly salted French fries. He is concerned about maintaining his blood pressure within acceptable limits for his upcoming surgery, which was rescheduled due to hospital issues, not his blood pressure.  He has been taking vitamin D  supplements, initially mistaking them for vitamin B, and is aware of his previous low vitamin D  levels. He is concerned about his upcoming knee replacement surgery and the impact of his back pain on his blood pressure. He has been dealing with back pain for over a year following an accident in September of the previous year.         Objective:  Physical Exam: BP (!) 140/70   Pulse 84   Temp 98.8 F (37.1 C) (Temporal)   Resp 18   Ht 5' 11 (1.803 m)   Wt 245 lb (111.1 kg)   SpO2 95%   BMI 34.17 kg/m   Gen: No acute distress, resting comfortably  CV: Regular rate and rhythm with no murmurs appreciated Pulm: Normal work of breathing, clear to auscultation bilaterally with no crackles, wheezes, or rhonchi Neuro: Grossly normal, moves all extremities Psych: Normal affect and thought content      Hanya Guerin M. Kennyth, MD 11/17/2023 2:55 PM

## 2023-11-17 NOTE — Assessment & Plan Note (Signed)
 Symptoms are currently manageable.  We did discuss that amlodipine  may cause extra fluid retention however symptoms are currently manageable with compression stockings.  He will be following up with us  in a week.

## 2023-11-23 ENCOUNTER — Encounter: Payer: Self-pay | Admitting: Family Medicine

## 2023-11-30 DIAGNOSIS — M47816 Spondylosis without myelopathy or radiculopathy, lumbar region: Secondary | ICD-10-CM | POA: Diagnosis not present

## 2023-12-20 ENCOUNTER — Ambulatory Visit: Attending: Cardiology | Admitting: Nurse Practitioner

## 2023-12-20 ENCOUNTER — Telehealth: Payer: Self-pay | Admitting: Nurse Practitioner

## 2023-12-20 DIAGNOSIS — Z01810 Encounter for preprocedural cardiovascular examination: Secondary | ICD-10-CM

## 2023-12-20 NOTE — Progress Notes (Signed)
 This encounter was created in error - please disregard.

## 2023-12-20 NOTE — Telephone Encounter (Signed)
   Patient Name: Dale Wood  DOB: 05-26-1950 MRN: [id number]  Primary Cardiologist: Peter Jordan, MD  Chart reviewed as part of pre-operative protocol coverage.  Patient was scheduled for preoperative telephone visit on 12/20/2023 at 8:40 AM.  Spoke with patient who states his surgery has been postponed indefinitely.  Advised patient to have his surgeon's office send an updated request in the future, should he decide to pursue surgery at a later time.  Patient verbalized understanding.  [name], NP 12/20/2023, 8:47 AM

## 2024-01-05 ENCOUNTER — Other Ambulatory Visit: Payer: Self-pay | Admitting: Family Medicine

## 2024-01-15 ENCOUNTER — Ambulatory Visit (HOSPITAL_COMMUNITY): Admit: 2024-01-15 | Admitting: Orthopedic Surgery

## 2024-01-15 SURGERY — ARTHROPLASTY, KNEE, TOTAL
Anesthesia: Choice | Site: Knee | Laterality: Right

## 2024-01-29 ENCOUNTER — Encounter: Payer: Self-pay | Admitting: Family Medicine

## 2024-01-29 NOTE — Telephone Encounter (Signed)
 Please cancelled called patient to cancelled on of those appt

## 2024-01-30 NOTE — Telephone Encounter (Signed)
 Can. the later one will be seen on 10/22/24 aw

## 2024-02-01 ENCOUNTER — Telehealth: Payer: Self-pay

## 2024-02-01 ENCOUNTER — Telehealth: Payer: Self-pay | Admitting: Family Medicine

## 2024-02-01 ENCOUNTER — Telehealth (HOSPITAL_BASED_OUTPATIENT_CLINIC_OR_DEPARTMENT_OTHER): Payer: Self-pay

## 2024-02-01 NOTE — Telephone Encounter (Signed)
 Patient has been scheduled for TELEVISIT

## 2024-02-01 NOTE — Telephone Encounter (Signed)
"  ° °  Pre-operative Risk Assessment    Patient Name: Dale Wood  DOB: 10-31-1950 MRN: [id number]   Date of last office visit: 07/03/23 with Jordan Date of next office visit: Na  Request for Surgical Clearance    Procedure:  right total knee arthroplasty   Date of Surgery:  Clearance 04/08/24                                 Surgeon:  Dr. Melodi Surgeon's Group or Practice Name:  [facility] Phone number:  [phone] Fax number:  [phone]   Type of Clearance Requested:   - Medical    Type of Anesthesia:  Choice   Additional requests/questions:    SignedAugustin JONETTA Daring   02/01/2024, 9:28 AM   "

## 2024-02-01 NOTE — Telephone Encounter (Signed)
" ° °  Name: Dale Wood  DOB: 11-16-1950  MRN: [id number]  Primary Cardiologist: Peter Jordan, MD   Preoperative team, please contact this patient and set up a phone call appointment for further preoperative risk assessment. Please obtain consent and complete medication review. Thank you for your help.  I confirm that guidance regarding antiplatelet and oral anticoagulation therapy has been completed and, if necessary, noted below.  None requested  I also confirmed the patient resides in the state of [place] . As per [facility] Medical Board telemedicine laws, the patient must reside in the state in which the provider is licensed.   Lum LITTIE Louis, NP 02/01/2024, 12:18 PM [facility]    "

## 2024-02-01 NOTE — Telephone Encounter (Signed)
 Patient has been scheduled for televisit and consent done     Patient Consent for Virtual Visit         Dale Wood has provided verbal consent on 02/01/2024 for a virtual visit (video or telephone).   CONSENT FOR VIRTUAL VISIT FOR:  Dale Wood  By participating in this virtual visit I agree to the following:  I hereby voluntarily request, consent and authorize [facility] and its employed or contracted physicians, physician assistants, nurse practitioners or other licensed health care professionals (the Practitioner), to provide me with telemedicine health care services (the Services) as deemed necessary by the treating Practitioner. I acknowledge and consent to receive the Services by the Practitioner via telemedicine. I understand that the telemedicine visit will involve communicating with the Practitioner through live audiovisual communication technology and the disclosure of certain medical information by electronic transmission. I acknowledge that I have been given the opportunity to request an in-person assessment or other available alternative prior to the telemedicine visit and am voluntarily participating in the telemedicine visit.  I understand that I have the right to withhold or withdraw my consent to the use of telemedicine in the course of my care at any time, without affecting my right to future care or treatment, and that the Practitioner or I may terminate the telemedicine visit at any time. I understand that I have the right to inspect all information obtained and/or recorded in the course of the telemedicine visit and may receive copies of available information for a reasonable fee.  I understand that some of the potential risks of receiving the Services via telemedicine include:  Delay or interruption in medical evaluation due to technological equipment failure or disruption; Information transmitted may not be sufficient (e.g. poor resolution of images) to  allow for appropriate medical decision making by the Practitioner; and/or  In rare instances, security protocols could fail, causing a breach of personal health information.  Furthermore, I acknowledge that it is my responsibility to provide information about my medical history, conditions and care that is complete and accurate to the best of my ability. I acknowledge that Practitioner's advice, recommendations, and/or decision may be based on factors not within their control, such as incomplete or inaccurate data provided by me or distortions of diagnostic images or specimens that may result from electronic transmissions. I understand that the practice of medicine is not an exact science and that Practitioner makes no warranties or guarantees regarding treatment outcomes. I acknowledge that a copy of this consent can be made available to me via my patient portal [facility] MyChart), or I can request a printed copy by calling the office of [facility].    I understand that my insurance will be billed for this visit.   I have read or had this consent read to me. I understand the contents of this consent, which adequately explains the benefits and risks of the Services being provided via telemedicine.  I have been provided ample opportunity to ask questions regarding this consent and the Services and have had my questions answered to my satisfaction. I give my informed consent for the services to be provided through the use of telemedicine in my medical care

## 2024-02-01 NOTE — Telephone Encounter (Signed)
 [facility] faxed Surgical Clearance, to be filled out by provider. [facility] requested to send it back via Fax within ASAP. Document is located in providers tray at front office.Please advise at [phone].

## 2024-02-05 NOTE — Telephone Encounter (Signed)
Placed form in PCP office to be reviewed

## 2024-02-06 NOTE — Telephone Encounter (Signed)
 Form faxed to [phone]

## 2024-02-19 ENCOUNTER — Ambulatory Visit: Admitting: Family Medicine

## 2024-02-26 ENCOUNTER — Ambulatory Visit: Admitting: Family Medicine

## 2024-03-18 ENCOUNTER — Ambulatory Visit

## 2024-04-08 ENCOUNTER — Ambulatory Visit (HOSPITAL_COMMUNITY): Admit: 2024-04-08 | Admitting: Orthopedic Surgery

## 2024-04-08 SURGERY — ARTHROPLASTY, KNEE, TOTAL
Anesthesia: Choice | Site: Knee | Laterality: Right

## 2024-10-22 ENCOUNTER — Ambulatory Visit

## 2024-10-28 ENCOUNTER — Ambulatory Visit
